# Patient Record
Sex: Male | Born: 1990 | Race: Black or African American | Hispanic: No | Marital: Single | Smoking: Former smoker
Health system: Southern US, Community
[De-identification: ages and names within clinical notes are randomized; demographics above are authoritative.]

## PROBLEM LIST (undated history)

## (undated) DIAGNOSIS — K635 Polyp of colon: Secondary | ICD-10-CM

## (undated) DIAGNOSIS — F32A Depression, unspecified: Secondary | ICD-10-CM

## (undated) DIAGNOSIS — F419 Anxiety disorder, unspecified: Secondary | ICD-10-CM

## (undated) DIAGNOSIS — K5190 Ulcerative colitis, unspecified, without complications: Secondary | ICD-10-CM

## (undated) DIAGNOSIS — K589 Irritable bowel syndrome without diarrhea: Secondary | ICD-10-CM

## (undated) DIAGNOSIS — K5090 Crohn's disease, unspecified, without complications: Secondary | ICD-10-CM

## (undated) DIAGNOSIS — B20 Human immunodeficiency virus [HIV] disease: Secondary | ICD-10-CM

## (undated) DIAGNOSIS — F329 Major depressive disorder, single episode, unspecified: Secondary | ICD-10-CM

## (undated) DIAGNOSIS — Z21 Asymptomatic human immunodeficiency virus [HIV] infection status: Secondary | ICD-10-CM

## (undated) DIAGNOSIS — J45909 Unspecified asthma, uncomplicated: Secondary | ICD-10-CM

## (undated) DIAGNOSIS — K922 Gastrointestinal hemorrhage, unspecified: Secondary | ICD-10-CM

## (undated) DIAGNOSIS — I10 Essential (primary) hypertension: Secondary | ICD-10-CM

## (undated) DIAGNOSIS — K529 Noninfective gastroenteritis and colitis, unspecified: Secondary | ICD-10-CM

## (undated) HISTORY — DX: Crohn's disease, unspecified, without complications: K50.90

## (undated) HISTORY — PX: BRAIN SURGERY: SHX531

## (undated) HISTORY — DX: Polyp of colon: K63.5

## (undated) HISTORY — DX: Major depressive disorder, single episode, unspecified: F32.9

## (undated) HISTORY — DX: Gastrointestinal hemorrhage, unspecified: K92.2

## (undated) HISTORY — DX: Ulcerative colitis, unspecified, without complications: K51.90

## (undated) HISTORY — DX: Unspecified asthma, uncomplicated: J45.909

## (undated) HISTORY — DX: Irritable bowel syndrome, unspecified: K58.9

## (undated) HISTORY — PX: COLONOSCOPY: SHX174

## (undated) HISTORY — DX: Depression, unspecified: F32.A

## (undated) HISTORY — DX: Anxiety disorder, unspecified: F41.9

---

## 2016-05-19 ENCOUNTER — Emergency Department (HOSPITAL_COMMUNITY): Payer: Self-pay

## 2016-05-19 ENCOUNTER — Emergency Department (HOSPITAL_COMMUNITY)
Admission: EM | Admit: 2016-05-19 | Discharge: 2016-05-20 | Disposition: A | Discharge status: Home / Self Care | Payer: Self-pay | Attending: Emergency Medicine | Admitting: Emergency Medicine

## 2016-05-19 ENCOUNTER — Encounter (HOSPITAL_COMMUNITY): Payer: Self-pay | Admitting: Emergency Medicine

## 2016-05-19 DIAGNOSIS — Z87891 Personal history of nicotine dependence: Secondary | ICD-10-CM | POA: Insufficient documentation

## 2016-05-19 DIAGNOSIS — K51811 Other ulcerative colitis with rectal bleeding: Secondary | ICD-10-CM | POA: Insufficient documentation

## 2016-05-19 DIAGNOSIS — R1031 Right lower quadrant pain: Secondary | ICD-10-CM

## 2016-05-19 DIAGNOSIS — Z79899 Other long term (current) drug therapy: Secondary | ICD-10-CM | POA: Insufficient documentation

## 2016-05-19 DIAGNOSIS — K5282 Eosinophilic colitis: Secondary | ICD-10-CM | POA: Insufficient documentation

## 2016-05-19 DIAGNOSIS — K529 Noninfective gastroenteritis and colitis, unspecified: Secondary | ICD-10-CM

## 2016-05-19 DIAGNOSIS — D500 Iron deficiency anemia secondary to blood loss (chronic): Secondary | ICD-10-CM | POA: Insufficient documentation

## 2016-05-19 HISTORY — DX: Noninfective gastroenteritis and colitis, unspecified: K52.9

## 2016-05-19 LAB — URINALYSIS, ROUTINE W REFLEX MICROSCOPIC
BILIRUBIN URINE: NEGATIVE
GLUCOSE, UA: NEGATIVE mg/dL
Hgb urine dipstick: NEGATIVE
KETONES UR: NEGATIVE mg/dL
Leukocytes, UA: NEGATIVE
Nitrite: NEGATIVE
PH: 5 (ref 5.0–8.0)
Protein, ur: NEGATIVE mg/dL
SPECIFIC GRAVITY, URINE: 1.024 (ref 1.005–1.030)

## 2016-05-19 LAB — CBC WITH DIFFERENTIAL/PLATELET
BASOS ABS: 0.1 10*3/uL (ref 0.0–0.1)
BASOS PCT: 1 %
EOS ABS: 1.2 10*3/uL — AB (ref 0.0–0.7)
Eosinophils Relative: 14 %
HCT: 28.2 % — ABNORMAL LOW (ref 39.0–52.0)
HEMOGLOBIN: 8.3 g/dL — AB (ref 13.0–17.0)
LYMPHS PCT: 35 %
Lymphs Abs: 2.9 10*3/uL (ref 0.7–4.0)
MCH: 19.6 pg — AB (ref 26.0–34.0)
MCHC: 29.4 g/dL — ABNORMAL LOW (ref 30.0–36.0)
MCV: 66.7 fL — ABNORMAL LOW (ref 78.0–100.0)
Monocytes Absolute: 1.1 10*3/uL — ABNORMAL HIGH (ref 0.1–1.0)
Monocytes Relative: 13 %
NEUTROS PCT: 37 %
Neutro Abs: 3.1 10*3/uL (ref 1.7–7.7)
Platelets: 641 10*3/uL — ABNORMAL HIGH (ref 150–400)
RBC: 4.23 MIL/uL (ref 4.22–5.81)
RDW: 16.8 % — ABNORMAL HIGH (ref 11.5–15.5)
WBC: 8.4 10*3/uL (ref 4.0–10.5)

## 2016-05-19 LAB — COMPREHENSIVE METABOLIC PANEL
ALBUMIN: 3.2 g/dL — AB (ref 3.5–5.0)
ALK PHOS: 102 U/L (ref 38–126)
ALT: 15 U/L — AB (ref 17–63)
AST: 25 U/L (ref 15–41)
Anion gap: 8 (ref 5–15)
BUN: 5 mg/dL — ABNORMAL LOW (ref 6–20)
CALCIUM: 8.7 mg/dL — AB (ref 8.9–10.3)
CHLORIDE: 103 mmol/L (ref 101–111)
CO2: 26 mmol/L (ref 22–32)
CREATININE: 0.97 mg/dL (ref 0.61–1.24)
GFR calc Af Amer: >60 mL/min (ref >60)
GFR calc non Af Amer: >60 mL/min (ref >60)
GLUCOSE: 96 mg/dL (ref 65–99)
Potassium: 4 mmol/L (ref 3.5–5.1)
SODIUM: 137 mmol/L (ref 135–145)
Total Bilirubin: 0.4 mg/dL (ref 0.3–1.2)
Total Protein: 5.9 g/dL — ABNORMAL LOW (ref 6.5–8.1)

## 2016-05-19 LAB — POC OCCULT BLOOD, ED: Fecal Occult Bld: POSITIVE — AB

## 2016-05-19 MED ORDER — IOPAMIDOL (ISOVUE-300) INJECTION 61%
INTRAVENOUS | Status: AC
  Filled 2016-05-19: qty 30

## 2016-05-19 MED ORDER — SODIUM CHLORIDE 0.9 % IV SOLN: 1000.0000 mL | INTRAVENOUS | Status: DC

## 2016-05-19 MED ORDER — MORPHINE SULFATE (PF) 4 MG/ML IV SOLN
4.0000 mg | Freq: Once | INTRAVENOUS | Status: AC
  Administered 2016-05-19: 4 mg via INTRAVENOUS
  Filled 2016-05-19: qty 1

## 2016-05-19 MED ORDER — SODIUM CHLORIDE 0.9 % IV SOLN
1000.0000 mL | Freq: Once | INTRAVENOUS | Status: AC
  Administered 2016-05-19: 1000 mL via INTRAVENOUS

## 2016-05-19 MED ORDER — ONDANSETRON HCL 4 MG/2ML IJ SOLN
4.0000 mg | Freq: Once | INTRAMUSCULAR | Status: AC
  Administered 2016-05-19: 4 mg via INTRAVENOUS
  Filled 2016-05-19: qty 2

## 2016-05-19 MED ORDER — IOPAMIDOL (ISOVUE-300) INJECTION 61%
INTRAVENOUS | Status: AC
  Administered 2016-05-20: 100 mL
  Filled 2016-05-19: qty 100

## 2016-05-19 NOTE — ED Notes (Signed)
Pt is aware he needs a urine sample but does not feel like he can give one yet

## 2016-05-19 NOTE — ED Triage Notes (Signed)
Pt c/o lower abd pain with blood in stools x's 1 week.  Pt st's she has hx of colitis and this feels the same.  Pt also c/o nausea without vomiting

## 2016-05-20 ENCOUNTER — Emergency Department (HOSPITAL_COMMUNITY): Payer: Self-pay

## 2016-05-20 ENCOUNTER — Encounter (HOSPITAL_COMMUNITY): Payer: Self-pay

## 2016-05-20 LAB — RAPID URINE DRUG SCREEN, HOSP PERFORMED
AMPHETAMINES: POSITIVE — AB
BENZODIAZEPINES: NOT DETECTED
Barbiturates: NOT DETECTED
Cocaine: NOT DETECTED
OPIATES: NOT DETECTED
Tetrahydrocannabinol: NOT DETECTED

## 2016-05-20 MED ORDER — OXYCODONE-ACETAMINOPHEN 5-325 MG PO TABS: 1.0000 | ORAL_TABLET | Freq: Four times a day (QID) | ORAL | 0 refills | Status: DC | PRN

## 2016-05-20 MED ORDER — MORPHINE SULFATE (PF) 4 MG/ML IV SOLN
4.0000 mg | Freq: Once | INTRAVENOUS | Status: AC
  Administered 2016-05-20: 4 mg via INTRAVENOUS
  Filled 2016-05-20: qty 1

## 2016-05-20 MED ORDER — ONDANSETRON HCL 4 MG/2ML IJ SOLN
4.0000 mg | Freq: Once | INTRAMUSCULAR | Status: AC
  Administered 2016-05-20: 4 mg via INTRAVENOUS
  Filled 2016-05-20: qty 2

## 2016-05-20 MED ORDER — METHYLPREDNISOLONE SODIUM SUCC 125 MG IJ SOLR
125.0000 mg | Freq: Once | INTRAMUSCULAR | Status: AC
  Administered 2016-05-20: 125 mg via INTRAVENOUS
  Filled 2016-05-20: qty 2

## 2016-05-20 MED ORDER — PREDNISONE 10 MG (21) PO TBPK: ORAL_TABLET | Freq: Every day | ORAL | 0 refills | Status: DC

## 2016-05-20 MED ORDER — FERROUS SULFATE 325 (65 FE) MG PO TABS: 325.0000 mg | ORAL_TABLET | Freq: Every day | ORAL | 0 refills | Status: DC

## 2016-05-20 MED ORDER — DIPHENHYDRAMINE HCL 50 MG/ML IJ SOLN: 25.0000 mg | Freq: Every evening | INTRAMUSCULAR | Status: DC | PRN

## 2016-05-20 MED ORDER — PROMETHAZINE HCL 25 MG PO TABS: 25.0000 mg | ORAL_TABLET | Freq: Four times a day (QID) | ORAL | 0 refills | Status: DC | PRN

## 2016-05-20 MED ORDER — MORPHINE SULFATE (PF) 4 MG/ML IV SOLN
4.0000 mg | Freq: Once | INTRAVENOUS | Status: AC
Start: 2016-05-20 — End: 2016-05-20
  Administered 2016-05-20: 4 mg via INTRAVENOUS
  Filled 2016-05-20: qty 1

## 2016-05-20 NOTE — Discharge Instructions (Signed)
To find a primary care or specialty doctor please call [phone] or [phone] to access "[facility] Doctor Service."  You may also go on the [facility] website at [email or web address]  There are also multiple [facility], [facility], [facility] and [facility] practices throughout the [facility] that are frequently accepting new patients. You may find a clinic that is close to your home and contact them.  [place] [id number] [place]  [place] [address] [address] [phone]

## 2016-05-20 NOTE — ED Provider Notes (Signed)
[facility] DEPT Provider Note   CSN: [id number] Arrival date & time: 05/19/16  1928     History   Chief Complaint Chief Complaint  Patient presents with  . Abdominal Pain    HPI Larry Lutz is a 26 y.o. male.  HPI Patient reports he has a history of ulcerative colitis. He states he has never had surgery before. He reports that he has been told he is anemic but has never had a blood transfusion. He reports for the past week he has been having a lot of abdominal pain particularly in the right lower quadrant. He reports he's had severe nausea but no vomiting. He reports he's been having blood with stool. No fevers. He states he has moved to [facility] more recently and has not gotten established with a gastroenterologist. Past Medical History:  Diagnosis Date  . Colitis     There are no active problems to display for this patient.   Past Surgical History:  Procedure Laterality Date  . BRAIN SURGERY         Home Medications    Prior to Admission medications   Not on File    Family History No family history on file.  Social History Social History  Substance Use Topics  . Smoking status: Former Research scientist (life sciences)  . Smokeless tobacco: Never Used  . Alcohol use No     Allergies   Patient has no allergy information on record.   Review of Systems Review of Systems 10 Systems reviewed and are negative for acute change except as noted in the HPI.   Physical Exam Updated Vital Signs BP 140/89   Pulse 86   Temp 98.7 F (37.1 C) (Oral)   Resp 16   Ht 5' 6"  (1.676 m)   Wt 167 lb 1 oz (75.8 kg)   SpO2 100%   BMI 26.96 kg/m   Physical Exam  Constitutional: He is oriented to person, place, and time. He appears well-developed and well-nourished.  HENT:  Head: Normocephalic and atraumatic.  Mouth/Throat: Oropharynx is clear and moist.  Eyes: Conjunctivae and EOM are normal.  Neck: Neck supple.  Cardiovascular: Normal rate and regular rhythm.   No murmur  heard. Pulmonary/Chest: Effort normal and breath sounds normal. No respiratory distress.  Abdominal: Soft. There is tenderness.  Moderate to severe right lower quadrant and right mid quadrant tenderness to palpation. No guarding.  Genitourinary:  Genitourinary Comments: Rectal exam: No evident hemorrhoids or active bleeding. Digital exam no stool in the vault. Trace pink tinged mucus. No clot. No melena.  Musculoskeletal: Normal range of motion. He exhibits no edema or tenderness.  Neurological: He is alert and oriented to person, place, and time. He exhibits normal muscle tone. Coordination normal.  Skin: Skin is warm and dry.  Psychiatric: He has a normal mood and affect.  Nursing note and vitals reviewed.    ED Treatments / Results  Labs (all labs ordered are listed, but only abnormal results are displayed) Labs Reviewed  CBC WITH DIFFERENTIAL/PLATELET - Abnormal; Notable for the following:       Result Value   Hemoglobin 8.3 (*)    HCT 28.2 (*)    MCV 66.7 (*)    MCH 19.6 (*)    MCHC 29.4 (*)    RDW 16.8 (*)    Platelets 641 (*)    Monocytes Absolute 1.1 (*)    Eosinophils Absolute 1.2 (*)    All other components within normal limits  COMPREHENSIVE METABOLIC PANEL - Abnormal; Notable  for the following:    BUN 5 (*)    Calcium 8.7 (*)    Total Protein 5.9 (*)    Albumin 3.2 (*)    ALT 15 (*)    All other components within normal limits  RAPID URINE DRUG SCREEN, HOSP PERFORMED - Abnormal; Notable for the following:    Amphetamines POSITIVE (*)    All other components within normal limits  POC OCCULT BLOOD, ED - Abnormal; Notable for the following:    Fecal Occult Bld POSITIVE (*)    All other components within normal limits  URINALYSIS, ROUTINE W REFLEX MICROSCOPIC    EKG  EKG Interpretation None       Radiology No results found.  Procedures Procedures (including critical care time)  Medications Ordered in ED Medications  0.9 %  sodium chloride infusion  (1,000 mLs Intravenous New Bag/Given 05/19/16 2323)    Followed by  0.9 %  sodium chloride infusion (not administered)  iopamidol (ISOVUE-300) 61 % injection (not administered)  iopamidol (ISOVUE-300) 61 % injection (not administered)  morphine 4 MG/ML injection 4 mg (4 mg Intravenous Given 05/19/16 2324)  ondansetron (ZOFRAN) injection 4 mg (4 mg Intravenous Given 05/19/16 2324)     Initial Impression / Assessment and Plan / ED Course  I have reviewed the triage vital signs and the nursing notes.  Pertinent labs & imaging results that were available during my care of the patient were reviewed by me and considered in my medical decision making (see chart for details).      Final Clinical Impressions(s) / ED Diagnoses   Final diagnoses:  Other ulcerative colitis with rectal bleeding ([place])  Right lower quadrant abdominal pain   Dr. Leonides Schanz will review the patient's CT scan. As time, patient has stable vital signs. I do suspect his anemia is chronic. Is no sign of symptomatic anemia. There is no melena in the rectal vault. If CT does not show urgent or emergent finding, I do feel patient will be safe to follow-up with gastroenterology on an outpatient basis. New Prescriptions New Prescriptions   No medications on file     Charlesetta Shanks, MD 05/20/16 0009

## 2016-05-20 NOTE — ED Notes (Signed)
Pt verbalized understanding of d/c instructions. To [facility] via [place]

## 2016-05-20 NOTE — ED Notes (Signed)
rcvd call from CT tech, pt advised CT he is having some itching after drinking contrast. Per Dr. Leonides Schanz we can give Benadryl if needed when patient returns

## 2016-05-20 NOTE — ED Notes (Signed)
Pt reminded it was time to drink 2nd bottle of oral contrast. Pt requesting pain medication

## 2016-05-20 NOTE — ED Notes (Signed)
No emesis after PO trial

## 2016-05-20 NOTE — ED Provider Notes (Signed)
3:40 AM  Assumed care from Dr. Colvin Caroli.  Pt is a 26 y.o. male with history of ulcerative colitis currently not on medications who presents to the emergency department with abdominal pain, rectal bleeding. Reports rectal bleeding has been going on for several months. No melena, not passing clots. Hemodynamically stable here. He is anemic which she states is baseline for him but he is not exactly sure what his hemoglobin is. There was a tinge colored mucus on rectal exam but no hemorrhage. Abdominal exam reveals no peritoneal signs. CT scan was pending for disposition.  CT scan shows inflammatory thickening of the descending colon through the rectum consistent with proctocolitis. There is no abscess or bowel obstruction. He states that he has taken steroids at home before with some relief. Will give dose of Solu-Medrol here. He reports feeling much better and would like discharge home. I have offered him admission for pain control and continue monitoring but he states he feels like he would like to try outpatient management and if things worsen he will come back. Plan is to discharge with prescriptions for Percocet, Phenergan, prednisone taper with outpatient PCP and GI follow-up as he is new to this area. Discussed at length return precautions. He verbalizes understanding and is comfortable with this plan.   At this time, I do not feel there is any life-threatening condition present. I have reviewed and discussed all results (EKG, imaging, lab, urine as appropriate) and exam findings with patient/family. I have reviewed nursing notes and appropriate previous records.  I feel the patient is safe to be discharged home without further emergent workup and can continue workup as an outpatient as needed. Discussed usual and customary return precautions. Patient/family verbalize understanding and are comfortable with this plan.  Outpatient follow-up has been provided if needed. All questions have been answered.     [place], DO 05/20/16 [phone]

## 2016-05-20 NOTE — ED Notes (Signed)
Pt to CT via stretcher

## 2016-05-31 ENCOUNTER — Encounter (HOSPITAL_COMMUNITY): Payer: Self-pay | Admitting: Emergency Medicine

## 2016-05-31 ENCOUNTER — Inpatient Hospital Stay (HOSPITAL_COMMUNITY)
Admission: EM | Admit: 2016-05-31 | Discharge: 2016-06-03 | DRG: 386 | Disposition: A | Discharge status: Home / Self Care | Payer: Self-pay | Attending: Internal Medicine | Admitting: Internal Medicine

## 2016-05-31 DIAGNOSIS — D62 Acute posthemorrhagic anemia: Secondary | ICD-10-CM | POA: Diagnosis present

## 2016-05-31 DIAGNOSIS — K5190 Ulcerative colitis, unspecified, without complications: Secondary | ICD-10-CM | POA: Diagnosis present

## 2016-05-31 DIAGNOSIS — R75 Inconclusive laboratory evidence of human immunodeficiency virus [HIV]: Secondary | ICD-10-CM | POA: Diagnosis present

## 2016-05-31 DIAGNOSIS — Z87891 Personal history of nicotine dependence: Secondary | ICD-10-CM

## 2016-05-31 DIAGNOSIS — R109 Unspecified abdominal pain: Secondary | ICD-10-CM

## 2016-05-31 DIAGNOSIS — K51218 Ulcerative (chronic) proctitis with other complication: Secondary | ICD-10-CM

## 2016-05-31 DIAGNOSIS — Z21 Asymptomatic human immunodeficiency virus [HIV] infection status: Secondary | ICD-10-CM | POA: Diagnosis present

## 2016-05-31 DIAGNOSIS — K51511 Left sided colitis with rectal bleeding: Principal | ICD-10-CM | POA: Diagnosis present

## 2016-05-31 DIAGNOSIS — K922 Gastrointestinal hemorrhage, unspecified: Secondary | ICD-10-CM | POA: Diagnosis present

## 2016-05-31 DIAGNOSIS — D649 Anemia, unspecified: Secondary | ICD-10-CM | POA: Diagnosis present

## 2016-05-31 LAB — CBC
HCT: 26.4 % — ABNORMAL LOW (ref 39.0–52.0)
HEMOGLOBIN: 7.5 g/dL — AB (ref 13.0–17.0)
MCH: 18 pg — ABNORMAL LOW (ref 26.0–34.0)
MCHC: 28.4 g/dL — ABNORMAL LOW (ref 30.0–36.0)
MCV: 63.3 fL — ABNORMAL LOW (ref 78.0–100.0)
PLATELETS: 637 10*3/uL — AB (ref 150–400)
RBC: 4.17 MIL/uL — AB (ref 4.22–5.81)
RDW: 17.1 % — ABNORMAL HIGH (ref 11.5–15.5)
WBC: 10.8 10*3/uL — AB (ref 4.0–10.5)

## 2016-05-31 LAB — COMPREHENSIVE METABOLIC PANEL
ALK PHOS: 95 U/L (ref 38–126)
ALT: 13 U/L — AB (ref 17–63)
ANION GAP: 7 (ref 5–15)
AST: 19 U/L (ref 15–41)
Albumin: 3.2 g/dL — ABNORMAL LOW (ref 3.5–5.0)
BUN: 11 mg/dL (ref 6–20)
CALCIUM: 8.9 mg/dL (ref 8.9–10.3)
CO2: 27 mmol/L (ref 22–32)
Chloride: 104 mmol/L (ref 101–111)
Creatinine, Ser: 1.03 mg/dL (ref 0.61–1.24)
Glucose, Bld: 83 mg/dL (ref 65–99)
Potassium: 3.3 mmol/L — ABNORMAL LOW (ref 3.5–5.1)
Sodium: 138 mmol/L (ref 135–145)
Total Bilirubin: 0.4 mg/dL (ref 0.3–1.2)
Total Protein: 6.8 g/dL (ref 6.5–8.1)

## 2016-05-31 LAB — I-STAT CG4 LACTIC ACID, ED: LACTIC ACID, VENOUS: 1.6 mmol/L (ref 0.5–1.9)

## 2016-05-31 LAB — LIPASE, BLOOD: LIPASE: 11 U/L (ref 11–51)

## 2016-05-31 NOTE — ED Triage Notes (Signed)
Pt sts lower abd pain and bloody stools x 2 weeks with N/V; pt sts hx of UC

## 2016-06-01 ENCOUNTER — Observation Stay (HOSPITAL_COMMUNITY): Payer: Self-pay

## 2016-06-01 ENCOUNTER — Encounter (HOSPITAL_COMMUNITY): Payer: Self-pay | Admitting: Internal Medicine

## 2016-06-01 DIAGNOSIS — K5190 Ulcerative colitis, unspecified, without complications: Secondary | ICD-10-CM | POA: Diagnosis present

## 2016-06-01 DIAGNOSIS — K922 Gastrointestinal hemorrhage, unspecified: Secondary | ICD-10-CM | POA: Diagnosis present

## 2016-06-01 DIAGNOSIS — K51911 Ulcerative colitis, unspecified with rectal bleeding: Secondary | ICD-10-CM

## 2016-06-01 DIAGNOSIS — D62 Acute posthemorrhagic anemia: Secondary | ICD-10-CM

## 2016-06-01 DIAGNOSIS — D649 Anemia, unspecified: Secondary | ICD-10-CM | POA: Diagnosis present

## 2016-06-01 LAB — URINALYSIS, ROUTINE W REFLEX MICROSCOPIC
Bilirubin Urine: NEGATIVE
Glucose, UA: NEGATIVE mg/dL
Hgb urine dipstick: NEGATIVE
Ketones, ur: NEGATIVE mg/dL
LEUKOCYTES UA: NEGATIVE
NITRITE: NEGATIVE
PH: 7 (ref 5.0–8.0)
Protein, ur: NEGATIVE mg/dL
SPECIFIC GRAVITY, URINE: 1.017 (ref 1.005–1.030)

## 2016-06-01 LAB — HEMOGLOBIN AND HEMATOCRIT, BLOOD
HCT: 28.6 % — ABNORMAL LOW (ref 39.0–52.0)
Hemoglobin: 8.3 g/dL — ABNORMAL LOW (ref 13.0–17.0)

## 2016-06-01 LAB — BASIC METABOLIC PANEL
ANION GAP: 8 (ref 5–15)
BUN: 11 mg/dL (ref 6–20)
CHLORIDE: 103 mmol/L (ref 101–111)
CO2: 28 mmol/L (ref 22–32)
CREATININE: 1.11 mg/dL (ref 0.61–1.24)
Calcium: 8 mg/dL — ABNORMAL LOW (ref 8.9–10.3)
GFR calc non Af Amer: >60 mL/min (ref >60)
Glucose, Bld: 110 mg/dL — ABNORMAL HIGH (ref 65–99)
POTASSIUM: 3.4 mmol/L — AB (ref 3.5–5.1)
SODIUM: 139 mmol/L (ref 135–145)

## 2016-06-01 LAB — CBC
HCT: 22.4 % — ABNORMAL LOW (ref 39.0–52.0)
HEMOGLOBIN: 6.4 g/dL — AB (ref 13.0–17.0)
MCH: 18.3 pg — ABNORMAL LOW (ref 26.0–34.0)
MCHC: 28.6 g/dL — AB (ref 30.0–36.0)
MCV: 64.2 fL — ABNORMAL LOW (ref 78.0–100.0)
Platelets: 530 10*3/uL — ABNORMAL HIGH (ref 150–400)
RBC: 3.49 MIL/uL — AB (ref 4.22–5.81)
RDW: 17.5 % — ABNORMAL HIGH (ref 11.5–15.5)
WBC: 9.1 10*3/uL (ref 4.0–10.5)

## 2016-06-01 LAB — PREPARE RBC (CROSSMATCH)

## 2016-06-01 LAB — ABO/RH: ABO/RH(D): A POS

## 2016-06-01 LAB — POC OCCULT BLOOD, ED: FECAL OCCULT BLD: POSITIVE — AB

## 2016-06-01 LAB — OCCULT BLOOD X 1 CARD TO LAB, STOOL: Fecal Occult Bld: POSITIVE — AB

## 2016-06-01 MED ORDER — ONDANSETRON HCL 4 MG/2ML IJ SOLN
4.0000 mg | Freq: Once | INTRAMUSCULAR | Status: AC
  Administered 2016-06-01: 4 mg via INTRAVENOUS
  Filled 2016-06-01: qty 2

## 2016-06-01 MED ORDER — ACETAMINOPHEN 325 MG PO TABS: 650.0000 mg | ORAL_TABLET | Freq: Four times a day (QID) | ORAL | Status: DC | PRN

## 2016-06-01 MED ORDER — MORPHINE SULFATE (PF) 4 MG/ML IV SOLN
2.0000 mg | Freq: Once | INTRAVENOUS | Status: AC
  Administered 2016-06-01: 2 mg via INTRAVENOUS
  Filled 2016-06-01: qty 1

## 2016-06-01 MED ORDER — SODIUM CHLORIDE 0.9 % IV SOLN
INTRAVENOUS | Status: AC
  Administered 2016-06-01 (×2): via INTRAVENOUS

## 2016-06-01 MED ORDER — ONDANSETRON HCL 4 MG PO TABS: 4.0000 mg | ORAL_TABLET | Freq: Four times a day (QID) | ORAL | Status: DC | PRN

## 2016-06-01 MED ORDER — MORPHINE SULFATE (PF) 2 MG/ML IV SOLN
2.0000 mg | INTRAVENOUS | Status: DC | PRN
  Administered 2016-06-01 – 2016-06-03 (×7): 2 mg via INTRAVENOUS
  Filled 2016-06-01 (×7): qty 1

## 2016-06-01 MED ORDER — ACETAMINOPHEN 650 MG RE SUPP: 650.0000 mg | Freq: Four times a day (QID) | RECTAL | Status: DC | PRN

## 2016-06-01 MED ORDER — SODIUM CHLORIDE 0.9 % IV BOLUS (SEPSIS)
1000.0000 mL | Freq: Once | INTRAVENOUS | Status: AC
  Administered 2016-06-01: 1000 mL via INTRAVENOUS

## 2016-06-01 MED ORDER — POTASSIUM CHLORIDE CRYS ER 20 MEQ PO TBCR
40.0000 meq | EXTENDED_RELEASE_TABLET | Freq: Once | ORAL | Status: AC
  Administered 2016-06-01: 40 meq via ORAL
  Filled 2016-06-01: qty 2

## 2016-06-01 MED ORDER — TUBERCULIN PPD 5 UNIT/0.1ML ID SOLN
5.0000 [IU] | Freq: Once | INTRADERMAL | Status: AC
  Administered 2016-06-01: 5 [IU] via INTRADERMAL
  Filled 2016-06-01: qty 0.1

## 2016-06-01 MED ORDER — HYOSCYAMINE SULFATE 0.5 MG/ML IJ SOLN
0.1250 mg | Freq: Once | INTRAMUSCULAR | Status: AC
  Administered 2016-06-01: 0.125 mg via INTRAVENOUS
  Filled 2016-06-01: qty 0.25

## 2016-06-01 MED ORDER — METHYLPREDNISOLONE SODIUM SUCC 40 MG IJ SOLR
40.0000 mg | Freq: Two times a day (BID) | INTRAMUSCULAR | Status: DC
  Administered 2016-06-01 – 2016-06-03 (×4): 40 mg via INTRAVENOUS
  Filled 2016-06-01 (×4): qty 1

## 2016-06-01 MED ORDER — METHYLPREDNISOLONE SODIUM SUCC 40 MG IJ SOLR
40.0000 mg | Freq: Every day | INTRAMUSCULAR | Status: DC
  Filled 2016-06-01: qty 1

## 2016-06-01 MED ORDER — SODIUM CHLORIDE 0.9 % IV SOLN
Freq: Once | INTRAVENOUS | Status: AC
  Administered 2016-06-01: 07:00:00 via INTRAVENOUS

## 2016-06-01 MED ORDER — ONDANSETRON HCL 4 MG/2ML IJ SOLN
4.0000 mg | Freq: Four times a day (QID) | INTRAMUSCULAR | Status: DC | PRN
  Administered 2016-06-01: 4 mg via INTRAVENOUS
  Filled 2016-06-01: qty 2

## 2016-06-01 NOTE — Consult Note (Signed)
[facility] Gastroenterology Consultation Note  Referring Provider: Dr. Dessa Phi [facility]) Primary Care Physician:  No PCP Per Patient  Reason for Consultation:  Bloody diarrhea, abnormal CT scan  HPI: Larry Lutz is a 26 y.o. male with 8-year history of ulcerative colitis, presenting with refractory bloody diarrhea.  Having several bloody stools per day with urgency and generalized abdominal pain and subjective fevers.  No improvement with outpatient course of prednisone.  Recent CT scan showed left-sided colitis.  Been intermittently on prednisone and multiple admissions in [place], much of the past several years.  No maintenance therapy attempted per patient other than mesalamine agents, which haven't helped.  No sick contacts, extracontinental travel, recent antibiotics.   Past Medical History:  Diagnosis Date  . Colitis     Past Surgical History:  Procedure Laterality Date  . BRAIN SURGERY      Prior to Admission medications   Medication Sig Start Date End Date Taking? Authorizing Provider  ferrous sulfate 325 (65 FE) MG tablet Take 1 tablet (325 mg total) by mouth daily. 05/20/16  Yes Kristen N Ward, DO  loperamide (IMODIUM) 2 MG capsule Take 2 mg by mouth daily as needed for diarrhea or loose stools.   Yes Historical Provider, MD  oxyCODONE-acetaminophen (PERCOCET/ROXICET) 5-325 MG tablet Take 1-2 tablets by mouth every 6 (six) hours as needed. Patient taking differently: Take 1-2 tablets by mouth every 6 (six) hours as needed for moderate pain.  05/20/16  Yes Kristen N Ward, DO  predniSONE (STERAPRED UNI-PAK 21 TAB) 10 MG (21) TBPK tablet Take by mouth daily. Take 6 tabs by mouth daily  for 2 days, then 5 tabs for 2 days, then 4 tabs for 2 days, then 3 tabs for 2 days, 2 tabs for 2 days, then 1 tab by mouth daily for 2 days 05/20/16  Yes Kristen N Ward, DO  promethazine (PHENERGAN) 25 MG tablet Take 1 tablet (25 mg total) by mouth every 6 (six) hours as needed for nausea or vomiting. 05/20/16   Yes Kristen N Ward, DO    Current Facility-Administered Medications  Medication Dose Route Frequency Provider Last Rate Last Dose  . 0.9 %  sodium chloride infusion   Intravenous Continuous Rise Patience, MD 100 mL/hr at 06/01/16 0441    . acetaminophen (TYLENOL) tablet 650 mg  650 mg Oral Q6H PRN Rise Patience, MD       Or  . acetaminophen (TYLENOL) suppository 650 mg  650 mg Rectal Q6H PRN Rise Patience, MD      . methylPREDNISolone sodium succinate (SOLU-MEDROL) 40 mg/mL injection 40 mg  40 mg Intravenous Daily Rise Patience, MD      . morphine 2 MG/ML injection 2 mg  2 mg Intravenous Q4H PRN Rise Patience, MD   2 mg at 06/01/16 1050  . ondansetron (ZOFRAN) tablet 4 mg  4 mg Oral Q6H PRN Rise Patience, MD       Or  . ondansetron [facility]) injection 4 mg  4 mg Intravenous Q6H PRN Rise Patience, MD   4 mg at 06/01/16 0448    Allergies as of 05/31/2016  . (No Known Allergies)    Family History  Problem Relation Age of Onset  . Ulcerative colitis Mother     Social History   Social History  . Marital status: Single    Spouse name: N/A  . Number of children: N/A  . Years of education: N/A   Occupational History  . Not  on file.   Social History Main Topics  . Smoking status: Former Research scientist (life sciences)  . Smokeless tobacco: Never Used  . Alcohol use No  . Drug use: No  . Sexual activity: Not on file   Other Topics Concern  . Not on file   Social History Narrative  . No narrative on file    Review of Systems: Positive = bold Gen: Denies any fever, chills, rigors, night sweats, anorexia, fatigue, weakness, malaise, involuntary weight loss, and sleep disorder CV: Denies chest pain, angina, palpitations, syncope, orthopnea, PND, peripheral edema, and claudication. Resp: Denies dyspnea, cough, sputum, wheezing, coughing up blood. GI: Described in detail in HPI.    GU : Denies urinary burning, blood in urine, urinary frequency, urinary  hesitancy, nocturnal urination, and urinary incontinence. MS: Denies joint pain or swelling.  Denies muscle weakness, cramps, atrophy.  Derm: Denies rash, itching, oral ulcerations, hives, unhealing ulcers.  Psych: Denies depression, anxiety, memory loss, suicidal ideation, hallucinations,  and confusion. Heme: Denies bruising, bleeding, and enlarged lymph nodes. Neuro:  Denies any headaches, dizziness, paresthesias. Endo:  Denies any problems with DM, thyroid, adrenal function.  Physical Exam: Vital signs in last 24 hours: Temp:  [98.5 F (36.9 C)-99.2 F (37.3 C)] 98.5 F (36.9 C) (03/20 1036) Pulse Rate:  [71-118] 71 (03/20 1036) Resp:  [16-18] 18 (03/20 1036) BP: (103-138)/(54-99) 126/76 (03/20 1036) SpO2:  [98 %-100 %] 99 % (03/20 1036) Weight:  [72.1 kg (158 lb 14.4 oz)] 72.1 kg (158 lb 14.4 oz) (03/20 0400) Last BM Date: 05/31/16 General:   Alert,  Well-developed, well-nourished, pleasant and cooperative in NAD Head:  Normocephalic and atraumatic. Eyes:  Sclera clear, no icterus.   Conjunctiva pink. Ears:  Normal auditory acuity. Nose:  No deformity, discharge,  or lesions. Mouth:  No deformity or lesions.  Oropharynx dry Neck:  Supple; no masses or thyromegaly. Lungs:  Clear throughout to auscultation.   No wheezes, crackles, or rhonchi. No acute distress. Heart:  Regular rate and rhythm; no murmurs, clicks, rubs,  or gallops. Abdomen:  Soft, mild distention, hypoactive but present bowel sounds, mild generalized tenderness No masses, hepatosplenomegaly or hernias noted. Normal bowel sounds, without guarding, and without rebound.     Msk:  Symmetrical without gross deformities. Normal posture. Pulses:  Normal pulses noted. Extremities:  Without clubbing or edema. Neurologic:  Alert and  oriented x4; diffusely weak, otherwise grossly normal neurologically. Skin:  Multiple tattoos, Intact without significant lesions or rashes. Psych:  Alert and cooperative. Normal mood and  affect.   Lab Results:  Recent Labs  05/31/16 1858 06/01/16 0434 06/01/16 1122  WBC 10.8* 9.1  --   HGB 7.5* 6.4* 8.3*  HCT 26.4* 22.4* 28.6*  PLT 637* 530*  --    BMET  Recent Labs  05/31/16 1858 06/01/16 0434  NA 138 139  K 3.3* 3.4*  CL 104 103  CO2 27 28  GLUCOSE 83 110*  BUN 11 11  CREATININE 1.03 1.11  CALCIUM 8.9 8.0*   LFT  Recent Labs  05/31/16 1858  PROT 6.8  ALBUMIN 3.2*  AST 19  ALT 13*  ALKPHOS 95  BILITOT 0.4   PT/INR No results for input(s): LABPROT, INR in the last 72 hours.  Studies/Results: No results found.  Impression:  1.  Abdominal pain. 2.  Subjective fevers. 3.  Blood loss anemia. 4.  Abnormal CT scan, left-sided colitis. 5.  Overall constellation of findings most consistent with ulcerative colitis flare.  Concomitant infection can't be  ruled out.  Plan:  1.  Clear liquid diet only. 2.  Stool studies (C. Diff, GI pathogen panel). 3.  Increase Solumedrol to 40 mg IV Q 12 hours. 4.  If no improvement over the next couple days, consider sigmoidoscopy. 5.  Patient likely is going to need anti-TNF therapy long-term; will initiate screening studies in anticipation of this need in the near future. 6.  [facility] will follow.   LOS: 1 day   Shayana Hornstein M  06/01/2016, 1:10 PM  Pager [phone] If no answer or after 5 PM call [phone]

## 2016-06-01 NOTE — ED Notes (Signed)
Pt requesting food and drink, Nehemiah Settle PA asked and pt given [place] sandwich and ginger ale

## 2016-06-01 NOTE — H&P (Signed)
History and Physical    Birt Reinoso SFK:[id number] DOB: 1990/08/17 DOA: 05/31/2016  PCP: No PCP Per Patient  Patient coming from: Home.  Chief Complaint: Rectal bleeding and abdominal pain.  HPI: Larry Lutz is a 26 y.o. male with history of ulcerative colitis who has recently moved to [place] 2 months ago and has not taken his medications presented to the ER 10 days ago with increasing rectal bleeding abdominal pain with nausea vomiting. CT scan of the abdomen at that time showed inflammation involving the descending colon through rectum. Patient was discharged home on prednisone and referred to outpatient gastroenterologist. Patient came back to the ER today due to persistent symptoms. Pain is mostly in the lower abdomen. Pain increases on bowel movement.   ED Course: On exam patient has mild tenderness in the lower quadrants mostly the left side. Hemoglobin is around 7 at drop of almost 1 g from previous. Patient is being admitted for acute exacerbation of ulcerative colitis.  Review of Systems: As per HPI, rest all negative.   Past Medical History:  Diagnosis Date  . Colitis     Past Surgical History:  Procedure Laterality Date  . BRAIN SURGERY       reports that he has quit smoking. He has never used smokeless tobacco. He reports that he does not drink alcohol or use drugs.  No Known Allergies  Family History  Problem Relation Age of Onset  . Ulcerative colitis Mother     Prior to Admission medications   Medication Sig Start Date End Date Taking? Authorizing Provider  ferrous sulfate 325 (65 FE) MG tablet Take 1 tablet (325 mg total) by mouth daily. 05/20/16  Yes Kristen N Ward, DO  loperamide (IMODIUM) 2 MG capsule Take 2 mg by mouth daily as needed for diarrhea or loose stools.   Yes Historical Provider, MD  oxyCODONE-acetaminophen (PERCOCET/ROXICET) 5-325 MG tablet Take 1-2 tablets by mouth every 6 (six) hours as needed. Patient taking differently: Take 1-2 tablets by  mouth every 6 (six) hours as needed for moderate pain.  05/20/16  Yes Kristen N Ward, DO  predniSONE (STERAPRED UNI-PAK 21 TAB) 10 MG (21) TBPK tablet Take by mouth daily. Take 6 tabs by mouth daily  for 2 days, then 5 tabs for 2 days, then 4 tabs for 2 days, then 3 tabs for 2 days, 2 tabs for 2 days, then 1 tab by mouth daily for 2 days 05/20/16  Yes Kristen N Ward, DO  promethazine (PHENERGAN) 25 MG tablet Take 1 tablet (25 mg total) by mouth every 6 (six) hours as needed for nausea or vomiting. 05/20/16  Yes Delice Bison Ward, DO    Physical Exam: Vitals:   06/01/16 0200 06/01/16 0215 06/01/16 0333 06/01/16 0400  BP: (!) 127/55 133/83 134/70 135/64  Pulse: (!) 118 93 92 73  Resp:   18 16  Temp:    98.6 F (37 C)  TempSrc:    Oral  SpO2: 100% 100% 98% 98%  Weight:    72.1 kg (158 lb 14.4 oz)  Height:    5' 7"  (1.702 m)      Constitutional: Moderately built and nourished. Vitals:   06/01/16 0200 06/01/16 0215 06/01/16 0333 06/01/16 0400  BP: (!) 127/55 133/83 134/70 135/64  Pulse: (!) 118 93 92 73  Resp:   18 16  Temp:    98.6 F (37 C)  TempSrc:    Oral  SpO2: 100% 100% 98% 98%  Weight:  72.1 kg (158 lb 14.4 oz)  Height:    5' 7"  (1.702 m)   Eyes: Anicteric no pallor. ENMT: No discharge from the ears eyes nose and mouth. Neck: No mass felt. No neck rigidity. Respiratory: No rhonchi or crepitations. Cardiovascular: S1-S2 heard no murmurs appreciated. Abdomen: Mild tenderness in the left lower quadrant. Musculoskeletal: No edema. No joint effusion. Skin: No rash. Skin appears warm. Neurologic: Alert awake oriented to time place and person. Moves all extremities. Psychiatric: Appears normal. Normal affect.   Labs on Admission: I have personally reviewed following labs and imaging studies  CBC:  Recent Labs Lab 05/31/16 1858  WBC 10.8*  HGB 7.5*  HCT 26.4*  MCV 63.3*  PLT 448*   Basic Metabolic Panel:  Recent Labs Lab 05/31/16 1858  NA 138  K 3.3*  CL 104    CO2 27  GLUCOSE 83  BUN 11  CREATININE 1.03  CALCIUM 8.9   GFR: Estimated Creatinine Clearance: 100.7 mL/min (by C-G formula based on SCr of 1.03 mg/dL). Liver Function Tests:  Recent Labs Lab 05/31/16 1858  AST 19  ALT 13*  ALKPHOS 95  BILITOT 0.4  PROT 6.8  ALBUMIN 3.2*    Recent Labs Lab 05/31/16 1858  LIPASE 11   No results for input(s): AMMONIA in the last 168 hours. Coagulation Profile: No results for input(s): INR, PROTIME in the last 168 hours. Cardiac Enzymes: No results for input(s): CKTOTAL, CKMB, CKMBINDEX, TROPONINI in the last 168 hours. BNP (last 3 results) No results for input(s): PROBNP in the last 8760 hours. HbA1C: No results for input(s): HGBA1C in the last 72 hours. CBG: No results for input(s): GLUCAP in the last 168 hours. Lipid Profile: No results for input(s): CHOL, HDL, LDLCALC, TRIG, CHOLHDL, LDLDIRECT in the last 72 hours. Thyroid Function Tests: No results for input(s): TSH, T4TOTAL, FREET4, T3FREE, THYROIDAB in the last 72 hours. Anemia Panel: No results for input(s): VITAMINB12, FOLATE, FERRITIN, TIBC, IRON, RETICCTPCT in the last 72 hours. Urine analysis:    Component Value Date/Time   COLORURINE YELLOW 06/01/2016 0326   APPEARANCEUR CLEAR 06/01/2016 0326   LABSPEC 1.017 06/01/2016 0326   PHURINE 7.0 06/01/2016 0326   GLUCOSEU NEGATIVE 06/01/2016 0326   HGBUR NEGATIVE 06/01/2016 0326   BILIRUBINUR NEGATIVE 06/01/2016 0326   KETONESUR NEGATIVE 06/01/2016 0326   PROTEINUR NEGATIVE 06/01/2016 0326   NITRITE NEGATIVE 06/01/2016 0326   LEUKOCYTESUR NEGATIVE 06/01/2016 0326   Sepsis Labs: @LABRCNTIP (procalcitonin:4,lacticidven:4) )No results found for this or any previous visit (from the past 240 hour(s)).   Radiological Exams on Admission: No results found.   Assessment/Plan Principal Problem:   Ulcerative colitis, acute (HCC) Active Problems:   Gastrointestinal hemorrhage   Acute blood loss anemia    1. Acute  exacerbation of ulcerative colitis - I have placed patient on Solu-Medrol 40 mg IV daily with IV fluids clear liquid diet. Consult gastroenterologist in a.m. Patient denies having taken any recent antibiotics or sick contacts. 2. Acute blood loss anemia - follow CBC. Patient agrees for transfusion if there is drop in hemoglobin less than 7.   DVT prophylaxis: SCDs. Code Status: Full code.  Family Communication: Discussed with patient.  Disposition Plan: Home.  Consults called: None.  Admission status: Observation.    Rise Patience MD [facility] Pager [phone].  If 7PM-7AM, please contact night-coverage [email or web address] Password [email or web address]  06/01/2016, 4:18 AM

## 2016-06-01 NOTE — ED Notes (Signed)
Admitting MD at bedside.

## 2016-06-01 NOTE — Progress Notes (Signed)
Progress note  Patient admitted earlier this morning. See H&P. He was diagnosed with ulcerative colitis when he was 26 years old. His most recent colonoscopy was July 2017. At that time, it showed inflammatory bowel disease with some polyps. He recently moved to [place] about 2 months ago and has not established with GI yet. He presented to the emergency department about 10 days ago with increased rectal bleeding, abdominal pain. CT scan showed inflammation involving the ascending colon to rectum. He was discharged home on prednisone. He returns with persistent symptoms.  Patient started on Solu-Medrol Full liquid diet 1 unit blood transfusion for acute blood loss anemia, trend H&H  Replace potassium  GI consultation  Dessa Phi, DO [facility] [email or web address] Password [email or web address] 06/01/2016, 10:15 AM

## 2016-06-01 NOTE — ED Provider Notes (Signed)
[facility] DEPT Provider Note   CSN: [id number] Arrival date & time: 05/31/16  1847     History   Chief Complaint Chief Complaint  Patient presents with  . Rectal Bleeding  . Emesis    HPI Larry Lutz is a 26 y.o. male.  Patient with a history of ulcerative colitis presents with diffuse abdominal pain, nausea, vomiting and diarrhea. He reports bloody stools for "months". Seen here 05/19/16 and diagnosed by CT scan with UC exaceration and referred to GI but has been unable to be seen yet. He reports subjective fever. New symptoms in the last 2-3 days are lightheadedness and extreme fatigue. No syncope, falls, injury. He states he is unable to eat or drink without vomiting or having an episode of diarrhea. No urinary symptoms, chest pain, SOB.   The history is provided by the patient. No language interpreter was used.  Rectal Bleeding  Associated symptoms: abdominal pain, fever, light-headedness and vomiting   Emesis   Associated symptoms include abdominal pain, chills, diarrhea and a fever. Pertinent negatives include no myalgias.    Past Medical History:  Diagnosis Date  . Colitis     There are no active problems to display for this patient.   Past Surgical History:  Procedure Laterality Date  . BRAIN SURGERY         Home Medications    Prior to Admission medications   Medication Sig Start Date End Date Taking? Authorizing Provider  ferrous sulfate 325 (65 FE) MG tablet Take 1 tablet (325 mg total) by mouth daily. 05/20/16   Kristen N Ward, DO  oxyCODONE-acetaminophen (PERCOCET/ROXICET) 5-325 MG tablet Take 1-2 tablets by mouth every 6 (six) hours as needed. 05/20/16   Kristen N Ward, DO  predniSONE (STERAPRED UNI-PAK 21 TAB) 10 MG (21) TBPK tablet Take by mouth daily. Take 6 tabs by mouth daily  for 2 days, then 5 tabs for 2 days, then 4 tabs for 2 days, then 3 tabs for 2 days, 2 tabs for 2 days, then 1 tab by mouth daily for 2 days 05/20/16   Delice Bison Ward, DO    promethazine (PHENERGAN) 25 MG tablet Take 1 tablet (25 mg total) by mouth every 6 (six) hours as needed for nausea or vomiting. 05/20/16   Mosses, DO    Family History History reviewed. No pertinent family history.  Social History Social History  Substance Use Topics  . Smoking status: Former Research scientist (life sciences)  . Smokeless tobacco: Never Used  . Alcohol use No     Allergies   Patient has no known allergies.   Review of Systems Review of Systems  Constitutional: Positive for chills, fatigue and fever.  Respiratory: Negative.  Negative for shortness of breath.   Cardiovascular: Negative.  Negative for chest pain.  Gastrointestinal: Positive for abdominal pain, blood in stool, diarrhea, hematochezia, nausea and vomiting.  Genitourinary: Negative.   Musculoskeletal: Negative.  Negative for myalgias.  Skin: Negative.   Neurological: Positive for weakness and light-headedness. Negative for syncope.     Physical Exam Updated Vital Signs BP 137/82   Pulse 88   Temp 99 F (37.2 C) (Oral)   Resp 18   SpO2 100%   Physical Exam  Constitutional: He is oriented to person, place, and time. He appears well-developed and well-nourished.  HENT:  Head: Normocephalic.  Neck: Normal range of motion. Neck supple.  Cardiovascular: Normal rate and regular rhythm.   No murmur heard. Pulmonary/Chest: Effort normal and breath sounds normal. He  has no wheezes. He has no rales.  Abdominal: Soft. Bowel sounds are normal. There is tenderness (Diffuse tenderness, greatest in the lower abdomen.). There is no rebound and no guarding.  Musculoskeletal: Normal range of motion.  Neurological: He is alert and oriented to person, place, and time.  Skin: Skin is warm and dry. No rash noted.  Psychiatric: He has a normal mood and affect.     ED Treatments / Results  Labs (all labs ordered are listed, but only abnormal results are displayed) Labs Reviewed  COMPREHENSIVE METABOLIC PANEL - Abnormal;  Notable for the following:       Result Value   Potassium 3.3 (*)    Albumin 3.2 (*)    ALT 13 (*)    All other components within normal limits  CBC - Abnormal; Notable for the following:    WBC 10.8 (*)    RBC 4.17 (*)    Hemoglobin 7.5 (*)    HCT 26.4 (*)    MCV 63.3 (*)    MCH 18.0 (*)    MCHC 28.4 (*)    RDW 17.1 (*)    Platelets 637 (*)    All other components within normal limits  LIPASE, BLOOD  URINALYSIS, ROUTINE W REFLEX MICROSCOPIC  I-STAT CG4 LACTIC ACID, ED  POC OCCULT BLOOD, ED  TYPE AND SCREEN    EKG  EKG Interpretation None       Radiology No results found.  Procedures Procedures (including critical care time)  Medications Ordered in ED Medications  sodium chloride 0.9 % bolus 1,000 mL (1,000 mLs Intravenous New Bag/Given 06/01/16 0042)  ondansetron (ZOFRAN) injection 4 mg (4 mg Intravenous Given 06/01/16 0040)  hyoscyamine (LEVSIN) 0.5 MG/ML injection 0.125 mg (0.125 mg Intravenous Given 06/01/16 0057)     Initial Impression / Assessment and Plan / ED Course  I have reviewed the triage vital signs and the nursing notes.  Pertinent labs & imaging results that were available during my care of the patient were reviewed by me and considered in my medical decision making (see chart for details).     Patient with a h/o UC here with complaint of abdominal pain, N, V, bloody diarrhea. Symptoms have been ongoing for months with new symptoms of fatigue and lightheadedness x 2 days.   Labs show hgb of 7.5, decreased from 8.3 on 05/19/16. CT scan at that time showed finding c/w UC. He has not had GI follow up. Normal vitals today. Do not feel repeat scan would have greater benefit than risk. Medications ordered, fluids. Will reassess.   On re-evaluation, the patient continues to have pain. He is guaiac positive with a hgb that is decreasing. VSS. Feel he will require admission and consideration of transfusion. Discussed admission with Dr. Renetta Chalk who accepts  the patient onto his service.   Final Clinical Impressions(s) / ED Diagnoses   Final diagnoses:  None   1. Ulcerative colitis 2. GI bleeding 3. Anemia  New Prescriptions New Prescriptions   No medications on file     Charlann Lange, Hershal Coria 01/74/94 4967    David Glick, MD 59/16/38 4665

## 2016-06-02 DIAGNOSIS — Z8781 Personal history of (healed) traumatic fracture: Secondary | ICD-10-CM

## 2016-06-02 DIAGNOSIS — Z21 Asymptomatic human immunodeficiency virus [HIV] infection status: Secondary | ICD-10-CM | POA: Diagnosis present

## 2016-06-02 DIAGNOSIS — Z7252 High risk homosexual behavior: Secondary | ICD-10-CM

## 2016-06-02 DIAGNOSIS — K5190 Ulcerative colitis, unspecified, without complications: Secondary | ICD-10-CM

## 2016-06-02 DIAGNOSIS — Z8379 Family history of other diseases of the digestive system: Secondary | ICD-10-CM

## 2016-06-02 LAB — CBC
HCT: 28.1 % — ABNORMAL LOW (ref 39.0–52.0)
HEMATOCRIT: 29.3 % — AB (ref 39.0–52.0)
HEMOGLOBIN: 8.5 g/dL — AB (ref 13.0–17.0)
HEMOGLOBIN: 8.3 g/dL — AB (ref 13.0–17.0)
MCH: 18.7 pg — ABNORMAL LOW (ref 26.0–34.0)
MCH: 19.1 pg — AB (ref 26.0–34.0)
MCHC: 29 g/dL — AB (ref 30.0–36.0)
MCHC: 29.5 g/dL — ABNORMAL LOW (ref 30.0–36.0)
MCV: 64.5 fL — ABNORMAL LOW (ref 78.0–100.0)
MCV: 64.6 fL — AB (ref 78.0–100.0)
PLATELETS: 626 10*3/uL — AB (ref 150–400)
Platelets: 613 10*3/uL — ABNORMAL HIGH (ref 150–400)
RBC: 4.54 MIL/uL (ref 4.22–5.81)
RBC: 4.35 MIL/uL (ref 4.22–5.81)
RDW: 16.9 % — ABNORMAL HIGH (ref 11.5–15.5)
RDW: 17.1 % — ABNORMAL HIGH (ref 11.5–15.5)
WBC: 7.3 10*3/uL (ref 4.0–10.5)
WBC: 12.4 10*3/uL — AB (ref 4.0–10.5)

## 2016-06-02 LAB — GASTROINTESTINAL PANEL BY PCR, STOOL (REPLACES STOOL CULTURE)
ADENOVIRUS F40/41: NOT DETECTED
ASTROVIRUS: NOT DETECTED
CAMPYLOBACTER SPECIES: NOT DETECTED
CYCLOSPORA CAYETANENSIS: NOT DETECTED
Cryptosporidium: NOT DETECTED
ENTEROPATHOGENIC E COLI (EPEC): NOT DETECTED
ENTEROTOXIGENIC E COLI (ETEC): NOT DETECTED
Entamoeba histolytica: NOT DETECTED
Enteroaggregative E coli (EAEC): NOT DETECTED
Giardia lamblia: NOT DETECTED
Norovirus GI/GII: NOT DETECTED
PLESIMONAS SHIGELLOIDES: NOT DETECTED
ROTAVIRUS A: NOT DETECTED
SAPOVIRUS (I, II, IV, AND V): NOT DETECTED
SHIGA LIKE TOXIN PRODUCING E COLI (STEC): NOT DETECTED
Salmonella species: NOT DETECTED
Shigella/Enteroinvasive E coli (EIEC): NOT DETECTED
Vibrio cholerae: NOT DETECTED
Vibrio species: NOT DETECTED
Yersinia enterocolitica: NOT DETECTED

## 2016-06-02 LAB — BASIC METABOLIC PANEL
ANION GAP: 8 (ref 5–15)
BUN: 5 mg/dL — ABNORMAL LOW (ref 6–20)
CALCIUM: 9 mg/dL (ref 8.9–10.3)
CO2: 28 mmol/L (ref 22–32)
Chloride: 102 mmol/L (ref 101–111)
Creatinine, Ser: 0.93 mg/dL (ref 0.61–1.24)
GLUCOSE: 116 mg/dL — AB (ref 65–99)
Sodium: 138 mmol/L (ref 135–145)

## 2016-06-02 LAB — TYPE AND SCREEN
ABO/RH(D): A POS
ANTIBODY SCREEN: NEGATIVE
Unit division: 0

## 2016-06-02 LAB — BPAM RBC
BLOOD PRODUCT EXPIRATION DATE: 201803272359
ISSUE DATE / TIME: 201803200647
UNIT TYPE AND RH: 600

## 2016-06-02 LAB — HIV 1/2 AB DIFFERENTIATION
HIV 1 Ab: POSITIVE — AB
HIV 2 Ab: NEGATIVE

## 2016-06-02 LAB — HIV ANTIBODY (ROUTINE TESTING W REFLEX): HIV Screen 4th Generation wRfx: REACTIVE — AB

## 2016-06-02 MED ORDER — DIPHENHYDRAMINE HCL 25 MG PO CAPS
25.0000 mg | ORAL_CAPSULE | Freq: Three times a day (TID) | ORAL | Status: DC | PRN
  Administered 2016-06-02: 25 mg via ORAL
  Filled 2016-06-02: qty 1

## 2016-06-02 MED ORDER — ALUM & MAG HYDROXIDE-SIMETH 200-200-20 MG/5ML PO SUSP: 15.0000 mL | ORAL | Status: DC | PRN

## 2016-06-02 MED ORDER — ALUM & MAG HYDROXIDE-SIMETH 200-200-20 MG/5ML PO SUSP
30.0000 mL | Freq: Once | ORAL | Status: AC
  Administered 2016-06-02: 30 mL via ORAL
  Filled 2016-06-02: qty 30

## 2016-06-02 MED ORDER — FAMOTIDINE 20 MG PO TABS
20.0000 mg | ORAL_TABLET | Freq: Two times a day (BID) | ORAL | Status: DC
  Administered 2016-06-02 (×2): 20 mg via ORAL
  Filled 2016-06-02 (×3): qty 1

## 2016-06-02 NOTE — Progress Notes (Signed)
[facility] PROGRESS NOTE  Larry Lutz RUE:[id number] DOB: 1990/12/21 DOA: 05/31/2016  PCP: No PCP Per Patient  Brief History/Interval Summary: 26 y.o. male with history of ulcerative colitis who has recently moved to [place] 2 months ago and has not taken his medications presented to the ER 10 days ago with increasing rectal bleeding abdominal pain with nausea vomiting. CT scan of the abdomen at that time showed inflammation involving the descending colon through rectum. Patient was discharged home on prednisone and referred to outpatient gastroenterologist. Patient came back to the ER today due to persistent symptoms. He was hospitalized for further management.  Reason for Visit: Ulcerative colitis  Consultants: Gastroenterology  Procedures: None  Antibiotics: None  Subjective/Interval History: Patient states that the frequency of his bowel movements, is decreasing. He is noticing less blood with each stool. Denies any nausea, vomiting. Still has some abdominal discomfort.  ROS: Denies any chest pain or shortness of breath.  Objective:  Vital Signs  Vitals:   06/01/16 1036 06/01/16 1944 06/02/16 0300 06/02/16 0324  BP: 126/76 112/73 131/79   Pulse: 71 78 77   Resp: 18 18 18    Temp: 98.5 F (36.9 C) 98.3 F (36.8 C) 97.9 F (36.6 C)   TempSrc: Oral Oral Oral   SpO2: 99% 100% 100%   Weight:    71.7 kg (158 lb)  Height:        Intake/Output Summary (Last 24 hours) at 06/02/16 1356 Last data filed at 06/02/16 0519  Gross per 24 hour  Intake             1020 ml  Output             1800 ml  Net             -780 ml   Filed Weights   06/01/16 0400 06/02/16 0324  Weight: 72.1 kg (158 lb 14.4 oz) 71.7 kg (158 lb)    General appearance: alert, cooperative, appears stated age and no distress Resp: clear to auscultation bilaterally Cardio: regular rate and rhythm, S1, S2 normal, no murmur, click, rub or gallop GI: Abdomen is soft. Distended. Mildly tender in  the left lower quadrant without any rebound, rigidity or guarding. No masses, organomegaly. Extremities: extremities normal, atraumatic, no cyanosis or edema Neurologic: No focal deficits  Lab Results:  Data Reviewed: I have personally reviewed following labs and imaging studies  CBC:  Recent Labs Lab 05/31/16 1858 06/01/16 0434 06/01/16 1122 06/02/16 0228  WBC 10.8* 9.1  --  7.3  HGB 7.5* 6.4* 8.3* 8.5*  HCT 26.4* 22.4* 28.6* 29.3*  MCV 63.3* 64.2*  --  64.5*  PLT 637* 530*  --  613*    Basic Metabolic Panel:  Recent Labs Lab 05/31/16 1858 06/01/16 0434 06/02/16 0228  NA 138 139 138  K 3.3* 3.4* DELTA CHECK NOTED  CL 104 103 102  CO2 27 28 28   GLUCOSE 83 110* 116*  BUN 11 11 5*  CREATININE 1.03 1.11 0.93  CALCIUM 8.9 8.0* 9.0    GFR: Estimated Creatinine Clearance: 111.5 mL/min (by C-G formula based on SCr of 0.93 mg/dL).  Liver Function Tests:  Recent Labs Lab 05/31/16 1858  AST 19  ALT 13*  ALKPHOS 95  BILITOT 0.4  PROT 6.8  ALBUMIN 3.2*     Recent Labs Lab 05/31/16 1858  LIPASE 11    Radiology Studies: Dg Chest 2 View  Result Date: 06/01/2016 CLINICAL DATA:  Epigastric pain, fever EXAM: CHEST  2 VIEW COMPARISON:  None. FINDINGS: The heart size and mediastinal contours are within normal limits. Both lungs are clear. The visualized skeletal structures are unremarkable. IMPRESSION: No active cardiopulmonary disease. Electronically Signed   By: Franchot Gallo M.D.   On: 06/01/2016 14:10   Dg Abd 2 Views  Result Date: 06/01/2016 CLINICAL DATA:  Crohn's disease, acute epigastric pain, acute colitis EXAM: ABDOMEN - 2 VIEW COMPARISON:  05/20/2016 FINDINGS: Negative for obstruction, significant dilatation, ileus, or free air. On the supine view, there is slight wall thickening of the left descending colon which can be seen with colitis in the setting of inflammatory bowel disease. This is in a similar location to the CT of 05/20/2016, when the patient  had active colitis. IMPRESSION: Negative for obstruction or free air. Query wall thickening in the left descending colon which can be seen with colitis. Electronically Signed   By: Jerilynn Mages.  Shick M.D.   On: 06/01/2016 14:12     Medications:  Scheduled: . famotidine  20 mg Oral BID  . methylPREDNISolone (SOLU-MEDROL) injection  40 mg Intravenous Q12H  . tuberculin  5 Units Intradermal Once   Continuous:  KKX:[id number] **OR** acetaminophen, alum & mag hydroxide-simeth, morphine injection, ondansetron **OR** ondansetron (ZOFRAN) IV  Assessment/Plan:  Principal Problem:   Ulcerative colitis, acute ([place]) Active Problems:   Gastrointestinal hemorrhage   Acute blood loss anemia    Acute exacerbation of ulcerative colitis. Patient was seen by gastroenterology. Patient was started on steroids. His symptoms are improving. GI pathogen panel was also ordered, although this is unlikely to be infectious. Continue to monitor for now.  Acute blood loss anemia. Patient was transfused. Hemoglobin has responded. Monitor closely.  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 Patient is new to this area. We'll need to discuss with him in detail. Will inform ID as well. Check HIV quantitative.   DVT Prophylaxis: SCD's    Code Status: Full Code  Family Communication: Discussed with patient  Disposition Plan: Await improvement.    LOS: 2 days   [facility] Pager [phone] 06/02/2016, 1:56 PM  If 7PM-7AM, please contact night-coverage at [email or web address], password [facility]
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
## 2016-06-02 NOTE — Progress Notes (Signed)
Subjective: Much improved.  Less bloody diarrhea, less abdominal pain.  Objective: Vital signs in last 24 hours: Temp:  [97.9 F (36.6 C)-98.5 F (36.9 C)] 97.9 F (36.6 C) (03/21 0300) Pulse Rate:  [71-78] 77 (03/21 0300) Resp:  [18] 18 (03/21 0300) BP: (112-131)/(73-79) 131/79 (03/21 0300) SpO2:  [99 %-100 %] 100 % (03/21 0300) Weight:  [71.7 kg (158 lb)] 71.7 kg (158 lb) (03/21 0324) Weight change: -0.408 kg (-14.4 oz) Last BM Date: 06/01/16  PE: GEN:  NAD ABD:  Soft, mild generalized tenderness, no distention, no peritonitis.  Lab Results: CBC    Component Value Date/Time   WBC 7.3 06/02/2016 0228   RBC 4.54 06/02/2016 0228   HGB 8.5 (L) 06/02/2016 0228   HCT 29.3 (L) 06/02/2016 0228   PLT 613 (H) 06/02/2016 0228   MCV 64.5 (L) 06/02/2016 0228   MCH 18.7 (L) 06/02/2016 0228   MCHC 29.0 (L) 06/02/2016 0228   RDW 16.9 (H) 06/02/2016 0228   LYMPHSABS 2.9 05/19/2016 1959   MONOABS 1.1 (H) 05/19/2016 1959   EOSABS 1.2 (H) 05/19/2016 1959   BASOSABS 0.1 05/19/2016 1959   CMP     Component Value Date/Time   NA 138 06/02/2016 0228   K DELTA CHECK NOTED 06/02/2016 0228   CL 102 06/02/2016 0228   CO2 28 06/02/2016 0228   GLUCOSE 116 (H) 06/02/2016 0228   BUN 5 (L) 06/02/2016 0228   CREATININE 0.93 06/02/2016 0228   CALCIUM 9.0 06/02/2016 0228   PROT 6.8 05/31/2016 1858   ALBUMIN 3.2 (L) 05/31/2016 1858   AST 19 05/31/2016 1858   ALT 13 (L) 05/31/2016 1858   ALKPHOS 95 05/31/2016 1858   BILITOT 0.4 05/31/2016 1858   GFRNONAA >60 06/02/2016 0228   GFRAA >60 06/02/2016 0228   ABD xray:  No bowel distention or free air; possible left-sided colitis  Assessment:  1.  Abdominal pain with bloody diarrhea.  Improving. 2.  Subjective fevers. 3.  Blood loss anemia. 4.  Abnormal CT scan, left-sided colitis. 5.  Overall constellation of findings most consistent with ulcerative colitis flare.  Concomitant infection can't be ruled out.  Plan:  1.  Advance diet to  soft mechanical. 2.  Continue methylprednisolone 40 mg IV Q12 hours for another day, possible transition to prednisone tomorrow. 3.  Awaiting stool studies. 4.  Awaiting PPD results. 5.  Likely will need infliximab for maintenance of remission, if he continues to improve can likely defer this as outpatient.  Would likely also benefit from flex sig prior to initiation of flex sig. 6.  [facility] will follow.   Landry Dyke 06/02/2016, 8:20 AM   Pager [phone] If no answer or after 5 PM call [phone]

## 2016-06-03 DIAGNOSIS — R634 Abnormal weight loss: Secondary | ICD-10-CM

## 2016-06-03 DIAGNOSIS — Z87891 Personal history of nicotine dependence: Secondary | ICD-10-CM

## 2016-06-03 DIAGNOSIS — Z6825 Body mass index (BMI) 25.0-25.9, adult: Secondary | ICD-10-CM

## 2016-06-03 LAB — BASIC METABOLIC PANEL
Anion gap: 10 (ref 5–15)
BUN: 12 mg/dL (ref 6–20)
CO2: 26 mmol/L (ref 22–32)
CREATININE: 1.13 mg/dL (ref 0.61–1.24)
Calcium: 8.8 mg/dL — ABNORMAL LOW (ref 8.9–10.3)
Chloride: 99 mmol/L — ABNORMAL LOW (ref 101–111)
GFR calc Af Amer: >60 mL/min (ref >60)
Glucose, Bld: 179 mg/dL — ABNORMAL HIGH (ref 65–99)
Potassium: 4.4 mmol/L (ref 3.5–5.1)
SODIUM: 135 mmol/L (ref 135–145)

## 2016-06-03 LAB — HEPATITIS B CORE ANTIBODY, TOTAL
HEP B C TOTAL AB: NEGATIVE
Hep B Core Total Ab: NEGATIVE

## 2016-06-03 LAB — CBC
HCT: 27.5 % — ABNORMAL LOW (ref 39.0–52.0)
Hemoglobin: 8 g/dL — ABNORMAL LOW (ref 13.0–17.0)
MCH: 19 pg — AB (ref 26.0–34.0)
MCHC: 29.1 g/dL — AB (ref 30.0–36.0)
MCV: 65.3 fL — ABNORMAL LOW (ref 78.0–100.0)
PLATELETS: 643 10*3/uL — AB (ref 150–400)
RBC: 4.21 MIL/uL — ABNORMAL LOW (ref 4.22–5.81)
RDW: 17.6 % — ABNORMAL HIGH (ref 11.5–15.5)
WBC: 10.9 10*3/uL — ABNORMAL HIGH (ref 4.0–10.5)

## 2016-06-03 LAB — HIV-1 RNA ULTRAQUANT REFLEX TO GENTYP+
HIV-1 RNA BY PCR: <20 copies/mL
HIV-1 RNA Quant, Log: UNDETERMINED log10copy/mL

## 2016-06-03 LAB — HEPATITIS A ANTIBODY, TOTAL: Hep A Total Ab: POSITIVE — AB

## 2016-06-03 LAB — HEPATITIS B SURFACE ANTIGEN: HEP B S AG: NEGATIVE

## 2016-06-03 LAB — T-HELPER CELLS (CD4) COUNT (NOT AT ARMC)
CD4 % Helper T Cell: 29 % — ABNORMAL LOW (ref 33–55)
CD4 T CELL ABS: 760 /uL (ref 400–2700)

## 2016-06-03 LAB — HEPATITIS B SURFACE ANTIBODY,QUALITATIVE: HEP B S AB: REACTIVE

## 2016-06-03 LAB — HEPATITIS B CORE ANTIBODY, IGM: Hep B C IgM: NEGATIVE

## 2016-06-03 LAB — RPR: RPR Ser Ql: NONREACTIVE

## 2016-06-03 MED ORDER — FERROUS SULFATE 325 (65 FE) MG PO TABS: 325.0000 mg | ORAL_TABLET | Freq: Every day | ORAL | 0 refills | Status: DC

## 2016-06-03 MED ORDER — FAMOTIDINE 20 MG PO TABS: 20.0000 mg | ORAL_TABLET | Freq: Two times a day (BID) | ORAL | 0 refills | Status: DC

## 2016-06-03 MED ORDER — PREDNISONE 20 MG PO TABS
40.0000 mg | ORAL_TABLET | Freq: Every day | ORAL | Status: DC
  Administered 2016-06-03: 40 mg via ORAL
  Filled 2016-06-03: qty 2

## 2016-06-03 MED ORDER — PREDNISONE 20 MG PO TABS: 40.0000 mg | ORAL_TABLET | Freq: Every day | ORAL | 0 refills | Status: DC

## 2016-06-03 MED ORDER — OXYCODONE-ACETAMINOPHEN 5-325 MG PO TABS: 1.0000 | ORAL_TABLET | Freq: Four times a day (QID) | ORAL | 0 refills | Status: DC | PRN

## 2016-06-03 NOTE — Discharge Summary (Signed)
[facility]  Physician Discharge Summary   Patient ID: Larry Lutz MRN: [id number] DOB/AGE: 10-27-1990 26 y.o.  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 Infectious disease to arrange outpatient follow-up for further management of HIV 2. Gastroenterology to arrange outpatient follow-up as well  DISCHARGE CONDITION: fair   Filed Weights   06/01/16 0400 06/02/16 0324 06/03/16 0509  Weight: 72.1 kg (158 lb 14.4 oz) 71.7 kg (158 lb) 74.6 kg (164 lb 7.4 oz)    INITIAL HISTORY: 26 y.o.malewith history of ulcerative colitis who has recently moved to [place] 2 months ago and has not taken his medications presented to the ER 10 days ago with increasing rectal bleeding abdominal pain with nausea vomiting. CT scan of the abdomen at that time showed inflammation involving the descending colon through rectum. Patient was discharged home on prednisone and referred to outpatient gastroenterologist. Patient came back to the ER today due to persistent symptoms. He was hospitalized for further management.  Consultations:  Gastroenterology  Infectious disease   HOSPITAL COURSE:   Acute exacerbation of ulcerative colitis. Impression presented with diarrhea and hematochezia. Patient was seen by gastroenterology. Patient was started on steroids. His symptoms started improving. GI pathogen panel was negative. This is unlikely to be infectious. Improved this morning. Hasn't had any bowel movement since yesterday. Abdominal pain is also improved. He is tolerating his diet. Discussed with Dr. Paulita Fujita with gastroenterology. Recommends discharging him on 40 mg of prednisone daily. He will arrange outpatient follow-up and biological  treatment for his ulcerative colitis.  Acute blood loss anemia. Patient was transfused. Hemoglobin has responded. Bleeding has subsided. Outpatient monitoring of blood work.  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 CD4 count is 760. Discussed in detail with patient. Dr. Megan Salon with infectious disease also consulted. He will arrange outpatient follow-up and treatment. HIV viral load is pending. RPR nonreactive. Risk factors discussed with the patient. Modes of transmission of HIV discussed with the patient. He is not in any sexual relationship at this time. He is bisexual. He admits to unprotected sex when he was incarcerated last year.  Patient is stable. He has improved. Okay for discharge home today.    PERTINENT LABS:  The results of significant diagnostics from this hospitalization (including imaging, microbiology, ancillary and laboratory) are listed below for reference.    Microbiology: Recent Results (from the past 240 hour(s))  Gastrointestinal Panel by PCR , Stool     Status: None   Collection Time: 06/01/16  1:32 PM  Result Value Ref Range Status   Campylobacter species NOT DETECTED NOT DETECTED Final   Plesimonas shigelloides NOT DETECTED NOT DETECTED Final   Salmonella species NOT DETECTED NOT DETECTED Final   Yersinia enterocolitica NOT DETECTED NOT DETECTED Final   Vibrio species NOT DETECTED NOT DETECTED Final   Vibrio cholerae NOT DETECTED NOT DETECTED Final   Enteroaggregative E coli (EAEC) NOT DETECTED NOT DETECTED Final   Enteropathogenic E coli (EPEC) NOT DETECTED NOT DETECTED Final   Enterotoxigenic E coli (ETEC) NOT DETECTED NOT DETECTED Final   Shiga like toxin producing E coli (STEC) NOT DETECTED NOT DETECTED Final   Shigella/Enteroinvasive E coli (EIEC) NOT DETECTED NOT DETECTED Final   Cryptosporidium NOT DETECTED NOT DETECTED Final   Cyclospora  cayetanensis NOT DETECTED NOT DETECTED Final   Entamoeba histolytica NOT DETECTED NOT DETECTED Final     Giardia lamblia NOT DETECTED NOT DETECTED Final   Adenovirus F40/41 NOT DETECTED NOT DETECTED Final   Astrovirus NOT DETECTED NOT DETECTED Final   Norovirus GI/GII NOT DETECTED NOT DETECTED Final   Rotavirus A NOT DETECTED NOT DETECTED Final   Sapovirus (I, II, IV, and V) NOT DETECTED NOT DETECTED Final     Labs: Basic Metabolic Panel:  Recent Labs Lab 05/31/16 1858 06/01/16 0434 06/02/16 0228 06/03/16 0217  NA 138 139 138 135  K 3.3* 3.4* DELTA CHECK NOTED 4.4  CL 104 103 102 99*  CO2 27 28 28 26   GLUCOSE 83 110* 116* 179*  BUN 11 11 5* 12  CREATININE 1.03 1.11 0.93 1.13  CALCIUM 8.9 8.0* 9.0 8.8*   Liver Function Tests:  Recent Labs Lab 05/31/16 1858  AST 19  ALT 13*  ALKPHOS 95  BILITOT 0.4  PROT 6.8  ALBUMIN 3.2*    Recent Labs Lab 05/31/16 1858  LIPASE 11   CBC:  Recent Labs Lab 05/31/16 1858 06/01/16 0434 06/01/16 1122 06/02/16 0228 06/02/16 1457 06/03/16 0217  WBC 10.8* 9.1  --  7.3 12.4* 10.9*  HGB 7.5* 6.4* 8.3* 8.5* 8.3* 8.0*  HCT 26.4* 22.4* 28.6* 29.3* 28.1* 27.5*  MCV 63.3* 64.2*  --  64.5* 64.6* 65.3*  PLT 637* 530*  --  613* 626* 643*    IMAGING STUDIES Dg Chest 2 View  Result Date: 06/01/2016 CLINICAL DATA:  Epigastric pain, fever EXAM: CHEST  2 VIEW COMPARISON:  None. FINDINGS: The heart size and mediastinal contours are within normal limits. Both lungs are clear. The visualized skeletal structures are unremarkable. IMPRESSION: No active cardiopulmonary disease. Electronically Signed   By: Franchot Gallo M.D.   On: 06/01/2016 14:10   Ct Abdomen Pelvis W Contrast  Result Date: 05/20/2016 CLINICAL DATA:  Lower abdominal pain with blood in stool x1 week EXAM: CT ABDOMEN AND PELVIS WITH CONTRAST TECHNIQUE: Multidetector CT imaging of the abdomen and pelvis was performed using the standard protocol following bolus administration of intravenous contrast. CONTRAST:  16m ISOVUE-300 IOPAMIDOL (ISOVUE-300) INJECTION 61% COMPARISON:  None.  FINDINGS: Lower chest: No acute abnormality. Hepatobiliary: No focal liver abnormality is seen. No gallstones, gallbladder wall thickening, or biliary dilatation. Pancreas: Unremarkable. No pancreatic ductal dilatation or surrounding inflammatory changes. Spleen: Normal in size without focal abnormality. Adrenals/Urinary Tract: Adrenal glands are unremarkable. Kidneys are normal, without renal calculi, focal lesion, or hydronephrosis. Bladder is unremarkable. Stomach/Bowel: Diffuse transmural thickening of the descending colon through rectum consistent with proctocolitis. No bowel obstruction. Moderate colonic stool burden from cecum to the splenic flexure. No small bowel obstruction normal appendix. Vascular/Lymphatic: No significant vascular findings are present. No enlarged abdominal or pelvic lymph nodes. Reproductive: Prostate is unremarkable. Other: Small fat containing umbilical hernia. No abdominopelvic ascites. Musculoskeletal: No acute or significant osseous findings. IMPRESSION: Inflammatory thickening descending colon through rectum consistent with proctocolitis. No abscess or bowel obstruction. There is a moderate degree of retained fecal residue however from cecum through splenic flexure just proximal to the area inflamed bowel. Electronically Signed   By: DAshley RoyaltyM.D.   On: 05/20/2016 03:10   Dg Abd 2 Views  Result Date: 06/01/2016 CLINICAL DATA:  Crohn's disease, acute epigastric pain, acute colitis EXAM: ABDOMEN - 2 VIEW COMPARISON:  05/20/2016 FINDINGS: Negative for obstruction, significant dilatation, ileus, or free air. On the supine view, there is slight wall thickening  of the left descending colon which can be seen with colitis in the setting of inflammatory bowel disease. This is in a similar location to the CT of 05/20/2016, when the patient had active colitis. IMPRESSION: Negative for obstruction or free air. Query wall thickening in the left descending colon which can be seen with  colitis. Electronically Signed   By: Jerilynn Mages.  Shick M.D.   On: 06/01/2016 14:12    DISCHARGE EXAMINATION: Vitals:   06/02/16 1700 06/02/16 2124 06/03/16 0509 06/03/16 1300  BP: 130/68 137/82 121/69 (!) 122/58  Pulse: 79 91 72 68  Resp: 20 18 18 20   Temp:  99 F (37.2 C) 98.6 F (37 C)   TempSrc:  Oral Oral   SpO2: 100% 98% 100% 100%  Weight:   74.6 kg (164 lb 7.4 oz)   Height:       General appearance: alert, cooperative, appears stated age and no distress Resp: clear to auscultation bilaterally Cardio: regular rate and rhythm, S1, S2 normal, no murmur, click, rub or gallop GI: Abdomen is soft. Mildly tender without any rebound, rigidity or guarding. No masses or organomegaly. Bowel sounds are present.  DISPOSITION: Home  Discharge Instructions    Call MD for:  difficulty breathing, headache or visual disturbances    Complete by:  As directed    Call MD for:  extreme fatigue    Complete by:  As directed    Call MD for:  persistant dizziness or light-headedness    Complete by:  As directed    Call MD for:  persistant nausea and vomiting    Complete by:  As directed    Call MD for:  severe uncontrolled pain    Complete by:  As directed    Call MD for:  temperature >100.4    Complete by:  As directed    Discharge instructions    Complete by:  As directed    Please taking medications as prescribed. Please be sure to follow-up with Dr. Paulita Fujita with gastroenterology as well as Dr. Megan Salon with infectious diseases. Seek attention if diarrhea and bleeding worsens.  You were cared for by a hospitalist during your hospital stay. If you have any questions about your discharge medications or the care you received while you were in the hospital after you are discharged, you can call the unit and asked to speak with the hospitalist on call if the hospitalist that took care of you is not available. Once you are discharged, your primary care physician will handle any further medical issues.  Please note that NO REFILLS for any discharge medications will be authorized once you are discharged, as it is imperative that you return to your primary care physician (or establish a relationship with a primary care physician if you do not have one) for your aftercare needs so that they can reassess your need for medications and monitor your lab values. If you do not have a primary care physician, you can call [phone] for a physician referral.   Increase activity slowly    Complete by:  As directed       ALLERGIES: No Known Allergies   Discharge Medication List as of 06/03/2016  3:15 PM    START taking these medications   Details  famotidine (PEPCID) 20 MG tablet Take 1 tablet (20 mg total) by mouth 2 (two) times daily., Starting Thu 06/03/2016, Print    predniSONE (DELTASONE) 20 MG tablet Take 2 tablets (40 mg total) by mouth daily with breakfast.,  Starting Thu 06/03/2016, Print      CONTINUE these medications which have CHANGED   Details  ferrous sulfate 325 (65 FE) MG tablet Take 1 tablet (325 mg total) by mouth daily., Starting Thu 06/03/2016, Print    oxyCODONE-acetaminophen (PERCOCET/ROXICET) 5-325 MG tablet Take 1-2 tablets by mouth every 6 (six) hours as needed for moderate pain., Starting Thu 06/03/2016, Print      CONTINUE these medications which have NOT CHANGED   Details  loperamide (IMODIUM) 2 MG capsule Take 2 mg by mouth daily as needed for diarrhea or loose stools., Historical Med    promethazine (PHENERGAN) 25 MG tablet Take 1 tablet (25 mg total) by mouth every 6 (six) hours as needed for nausea or vomiting., Starting Thu 05/20/2016, Print      STOP taking these medications     predniSONE (STERAPRED UNI-PAK 21 TAB) 10 MG (21) TBPK tablet          Follow-up Information    Landry Dyke, MD Follow up.   Specialty:  Gastroenterology Why:  his office will call for appt Contact information: [address] [place] [address] [phone]         Michel Bickers, MD Follow up.   Specialty:  Infectious Diseases Contact information: [address] [place] [address] [phone]           TOTAL DISCHARGE TIME: 35 mins  [facility] Pager [phone]  06/03/2016, 4:38 PM

## 2016-06-03 NOTE — Care Management Note (Signed)
Case Management Note Marvetta Gibbons RN, BSN Unit 2W-Case Manager [phone]  Patient Details  Name: Larry Lutz MRN: [id number] Date of Birth: November 26, 1990  Subjective/Objective:    Pt admitted with GIB                Action/Plan: PTA pt lived at home- independent- per MD note pt has f/u appointment made with ID clinic who will assist with applications for drug assistance program. No CM needs noted- pt's d/c meds are low cost.   Expected Discharge Date:  06/03/16               Expected Discharge Plan:  Home/Self Care  In-House Referral:     Discharge planning Services  CM Consult  Post Acute Care Choice:  NA Choice offered to:  NA  DME Arranged:    DME Agency:     HH Arranged:    [place] Agency:     Status of Service:  Completed, signed off  If discussed at [employer] of Stay Meetings, dates discussed:    Additional Comments:  Dawayne Patricia, RN 06/03/2016, 2:54 PM

## 2016-06-03 NOTE — Progress Notes (Signed)
Discharged patient per MD order. Patient was non-tele. Iv removed from right AC. AVS went over with patient. Prescriptions given. Patient advised to schedule follow up appointments if offices hadn't called in three days. Walked patient to cardiac rehab exit where ride was waiting. Patient to home with self.  Cyndia Bent RN

## 2016-06-03 NOTE — Progress Notes (Signed)
Patient ID: Larry Lutz, male   DOB: [birth year], 26 y.o.   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 famotidine  20 mg Oral BID  . predniSONE  40 mg Oral Q breakfast  . tuberculin  5 Units Intradermal Once    SUBJECTIVE: Larry Lutz is feeling a little bit better today but still having left lower quadrant pain. He had one loose stool today but did not notice any blood.  Review of Systems: Review of Systems  Constitutional: Positive for weight loss. Negative for chills, diaphoresis, fever and malaise/fatigue.  HENT: Negative for sore throat.   Respiratory: Negative for cough, sputum production and shortness of breath.   Cardiovascular: Negative for chest pain.  Gastrointestinal: Positive for abdominal pain. Negative for diarrhea, heartburn, nausea and vomiting.  Genitourinary: Negative for dysuria and frequency.  Musculoskeletal: Negative for joint pain and myalgias.  Skin: Negative for rash.  Neurological: Negative for dizziness and headaches.  Psychiatric/Behavioral: Negative for depression and substance abuse. The patient is not nervous/anxious.     Past Medical History:  Diagnosis Date  . Colitis     Social History  Substance Use Topics  . Smoking status: Former Research scientist (life sciences)  . Smokeless tobacco: Never Used  . Alcohol use No    Family History  Problem Relation Age of Onset  . Ulcerative colitis Mother    No Known Allergies  OBJECTIVE: Vitals:   06/02/16 0324 06/02/16 1700 06/02/16 2124 06/03/16 0509  BP:  130/68 137/82 121/69  Pulse:  79 91 72  Resp:  20 18 18   Temp:   99 F (37.2 C) 98.6 F (37 C)  TempSrc:   Oral Oral  SpO2:  100% 98% 100%  Weight: 158 lb (71.7 kg)   164 lb 7.4 oz (74.6 kg)  Height:       Body mass index is 25.76  kg/m.  Physical Exam  Constitutional: He is oriented to person, place, and time.  He is in good spirits. He states he just woke up.  HENT:  Mouth/Throat: No oropharyngeal exudate.  Eyes: Conjunctivae are normal.  Cardiovascular: Normal rate and regular rhythm.   No murmur heard. Pulmonary/Chest: Breath sounds normal.  Abdominal: Soft. He exhibits no mass. There is tenderness. There is no rebound and no guarding.  Musculoskeletal: Normal range of motion.  Neurological: He is alert and oriented to person, place, and time.  Skin: No rash noted.  Psychiatric: Mood and affect normal.    Lab Results Lab Results  Component Value Date   WBC 10.9 (H) 06/03/2016   HGB 8.0 (L) 06/03/2016   HCT 27.5 (L) 06/03/2016   MCV 65.3 (L) 06/03/2016   PLT 643 (H) 06/03/2016    Lab Results  Component Value Date   CREATININE 1.13 06/03/2016   BUN 12 06/03/2016   NA 135 06/03/2016   K 4.4 06/03/2016   CL 99 (L) 06/03/2016   CO2 26 06/03/2016    Lab Results  Component Value Date   ALT 13 (L) 05/31/2016   AST 19 05/31/2016   ALKPHOS 95 05/31/2016   BILITOT 0.4 05/31/2016    CD4 T Cell Abs (/uL)  Date Value  06/02/2016 760   Microbiology: Recent Results (from the past 240 hour(s))  Gastrointestinal Panel by PCR , Stool     Status: None   Collection Time: 06/01/16  1:32 PM  Result Value Ref Range Status   Campylobacter species NOT DETECTED NOT DETECTED Final   Plesimonas shigelloides NOT DETECTED NOT DETECTED Final   Salmonella species NOT DETECTED NOT DETECTED Final   Yersinia enterocolitica NOT DETECTED NOT DETECTED Final   Vibrio species NOT DETECTED NOT DETECTED Final   Vibrio cholerae NOT DETECTED NOT DETECTED Final   Enteroaggregative E coli (EAEC) NOT DETECTED NOT DETECTED Final   Enteropathogenic E coli (EPEC) NOT DETECTED NOT DETECTED Final   Enterotoxigenic E coli (ETEC) NOT DETECTED NOT DETECTED Final   Shiga like toxin producing E coli (STEC) NOT DETECTED NOT DETECTED  Final   Shigella/Enteroinvasive E coli (EIEC) NOT DETECTED NOT DETECTED Final   Cryptosporidium NOT DETECTED NOT DETECTED Final   Cyclospora cayetanensis NOT DETECTED NOT DETECTED Final   Entamoeba histolytica NOT DETECTED NOT DETECTED Final   Giardia lamblia NOT DETECTED NOT DETECTED Final   Adenovirus F40/41 NOT DETECTED NOT DETECTED Final   Astrovirus NOT DETECTED NOT DETECTED Final   Norovirus GI/GII NOT DETECTED NOT DETECTED Final   Rotavirus A NOT DETECTED NOT DETECTED Final   Sapovirus (I, II, IV, and V) NOT DETECTED NOT DETECTED Final     ASSESSMENT: Larry Lutz CD4 count as well up in the normal range which is obviously good. I reviewed interpretation of CD4 counts and HIV viral loads with him. His viral load is pending. There is no urgency to start antiretroviral therapy. I will get him back to in my clinic soon after he is discharged. He will need to meet with one of our financial counselors to sign up for [facility] drug assistance program.  PLAN: 1. Follow-up in my clinic after discharge  Michel Bickers, [place] for [place] [phone] pager   [phone] cell 06/03/2016, 2:10 PM

## 2016-06-03 NOTE — Discharge Instructions (Signed)
Ulcerative Colitis, Adult Ulcerative colitis is long-lasting (chronic) swelling (inflammation) of the large intestine (colon). Sores (ulcers) may also form on the colon. Ulcerative colitis is closely related to another condition of inflammation of the intestines that is called Crohn disease. Together, they are frequently referred to as inflammatory bowel disease (IBD). What are the causes? Ulcerative colitis is caused by increased activity of the immune system in the intestines. The immune system is the system that protects the body against harmful bacteria, viruses, fungi, and other things that can make you sick. When the immune system overacts, it causes inflammation. The cause of the increased immune system activity is not known. What increases the risk? Risk factors of ulcerative colitis include:  Age. This includes:  Being 52-41 years old.  Being older than 27 years old.  Having a family history of ulcerative colitis.  Being of Jewish descent. What are the signs or symptoms? Common symptoms of ulcerative colitis include rectal bleeding and diarrhea. There is a wide range of symptoms, and a person's symptoms depend on how severe the condition is. Additional symptoms may include:  Pain or cramping in the belly (abdomen).  Fever.  Fatigue.  Weight loss.  Night sweats.  Rectal pain.  Feeling the immediate need to have a bowel movement.  Nausea.  Loss of appetite.  Anemia.  Joint pain or soreness.  Eye irritation.  Certain skin rashes. How is this diagnosed? Ulcerative colitis may be diagnosed by:  Medical history and physical exam.  Blood tests and stool tests.  X-rays.  CT scans.  Colonoscopy. For this test, a flexible tube is inserted into your anus and your colon is examined.  Examination of a tissue sample from your colon (biopsy). How is this treated? Treatment for ulcerative colitis may include medicines to:  Decrease inflammation.  Control your  immune system. Surgery may also be necessary. Follow these instructions at home: Medicines and vitamins   Take medicines only as directed by your doctor. Do not take aspirin.  Ask your doctor if you should take any vitamins or supplements. Lifestyle   Exercise regularly.  Limit alcohol intake to no more than 1 drink per day for nonpregnant women and 2 drinks per day for men. One drink equals 12 ounces of beer, 5 ounces of wine, or 1 ounces of hard liquor. Eating and drinking   Drink enough fluid to keep your urine clear or pale yellow.  Ask your health care provider about the best diet for you. Follow the diet as directed by your health care provider. This may include:  Avoiding carbonated drinks.  Avoiding popcorn, vegetable skins, nuts, and other high-fiber foods when you have symptoms of ulcerative colitis.  Eating smaller meals more often.  Keeping a food diary. This may help you to find and avoid any foods that make you feel not well.  Limit your caffeine intake. General instructions   Keep all follow-up appointments as directed by your health care provider. This is important. Contact a health care provider if:  Your symptoms do not improve or get worse with treatment.  You continue to lose weight.  You have constant cramps or loose bowels.  You develop a new skin rash, skin sores, or eye problems.  You have a fever or chills. Get help right away if:  You have bloody diarrhea.  You have severe pain in your abdomen.  You vomit. This information is not intended to replace advice given to you by your health care provider. Make sure  you discuss any questions you have with your health care provider. Document Released: 12/09/2004 Document Revised: 11/02/2015 Document Reviewed: 06/24/2014 Elsevier Interactive Patient Education  2017 [employer].

## 2016-06-03 NOTE — Progress Notes (Signed)
Subjective: Bloody diarrhea nearly resolved. Abdominal pain is improving.  Objective: Vital signs in last 24 hours: Temp:  [98.6 F (37 C)-99 F (37.2 C)] 98.6 F (37 C) (03/22 0509) Pulse Rate:  [68-91] 68 (03/22 1300) Resp:  [18-20] 20 (03/22 1300) BP: (121-137)/(58-82) 122/58 (03/22 1300) SpO2:  [98 %-100 %] 100 % (03/22 1300) Weight:  [74.6 kg (164 lb 7.4 oz)] 74.6 kg (164 lb 7.4 oz) (03/22 0509) Weight change: 2.932 kg (6 lb 7.4 oz) Last BM Date: 06/02/16  PE: GEN:  NAD ABD:  Soft, mild protuberant, mild generalized tenderness without peritonitis SKIN:  PPD right forearm negative.  Lab Results: CBC    Component Value Date/Time   WBC 10.9 (H) 06/03/2016 0217   RBC 4.21 (L) 06/03/2016 0217   HGB 8.0 (L) 06/03/2016 0217   HCT 27.5 (L) 06/03/2016 0217   PLT 643 (H) 06/03/2016 0217   MCV 65.3 (L) 06/03/2016 0217   MCH 19.0 (L) 06/03/2016 0217   MCHC 29.1 (L) 06/03/2016 0217   RDW 17.6 (H) 06/03/2016 0217   LYMPHSABS 2.9 05/19/2016 1959   MONOABS 1.1 (H) 05/19/2016 1959   EOSABS 1.2 (H) 05/19/2016 1959   BASOSABS 0.1 05/19/2016 1959   CMP     Component Value Date/Time   NA 135 06/03/2016 0217   K 4.4 06/03/2016 0217   CL 99 (L) 06/03/2016 0217   CO2 26 06/03/2016 0217   GLUCOSE 179 (H) 06/03/2016 0217   BUN 12 06/03/2016 0217   CREATININE 1.13 06/03/2016 0217   CALCIUM 8.8 (L) 06/03/2016 0217   PROT 6.8 05/31/2016 1858   ALBUMIN 3.2 (L) 05/31/2016 1858   AST 19 05/31/2016 1858   ALT 13 (L) 05/31/2016 1858   ALKPHOS 95 05/31/2016 1858   BILITOT 0.4 05/31/2016 1858   GFRNONAA >60 06/03/2016 0217   GFRAA >60 06/03/2016 0217    Assessment:  1. Abdominal pain with bloody diarrhea. Highly likely acute flare of chronic ulcerative colitis.  Stool studies negative for infection. 2. Subjective fevers. 3. Blood loss anemia. 4. Abnormal CT scan, left-sided colitis. 5. Overall constellation of findings most consistent with ulcerative colitis flare.  Concomitant infection can't be ruled out. 6.  HIV antibody positive.  Plan:  1.  OK to transition to prednisone 40 mg po qd and discharge home today. 2.  I have discussed case with Dr. Megan Salon in Infectious Diseases.  We will wait on quantiferon gold results and HIV viral load.  However, unless quantiferon is positive, Dr. Megan Salon felt it was ok to start infliximab (or other immunosuppressive therapy) for patient's ulcerative colitis, especially given patient's CD4 count (> 700). 3.  Will arrange outpatient follow-up with me in the next 2-3 weeks. 4.  Will sign-off; please call with questions; thank you for the consultation.   Larry Lutz 06/03/2016, 2:31 PM   Pager [phone] If no answer or after 5 PM call [phone]

## 2016-06-04 LAB — QUANTIFERON IN TUBE
QFT TB AG MINUS NIL VALUE: 0 IU/mL
QUANTIFERON MITOGEN VALUE: 0.43 IU/mL
QUANTIFERON TB AG VALUE: 0.02 IU/mL
QUANTIFERON TB GOLD: UNDETERMINED
Quantiferon Nil Value: 0.02 IU/mL

## 2016-06-04 LAB — HIV-1 RNA QUANT-NO REFLEX-BLD
HIV 1 RNA QUANT: 20 {copies}/mL
LOG10 HIV-1 RNA: 1.301 log10copy/mL

## 2016-06-04 LAB — QUANTIFERON TB GOLD ASSAY (BLOOD)

## 2016-06-09 LAB — HLA B*5701: HLA B 5701: NEGATIVE

## 2016-06-28 ENCOUNTER — Ambulatory Visit: Payer: Self-pay | Admitting: Internal Medicine

## 2016-10-05 ENCOUNTER — Ambulatory Visit: Payer: Self-pay | Admitting: Infectious Diseases

## 2016-10-21 ENCOUNTER — Encounter (HOSPITAL_COMMUNITY): Payer: Self-pay | Admitting: Emergency Medicine

## 2016-10-21 ENCOUNTER — Emergency Department (HOSPITAL_COMMUNITY)
Admission: EM | Admit: 2016-10-21 | Discharge: 2016-10-21 | Disposition: A | Discharge status: Other Acute Inpatient Hospital | Payer: Self-pay | Attending: Emergency Medicine | Admitting: Emergency Medicine

## 2016-10-21 ENCOUNTER — Emergency Department (HOSPITAL_COMMUNITY): Payer: Self-pay

## 2016-10-21 ENCOUNTER — Inpatient Hospital Stay (HOSPITAL_COMMUNITY)
Admission: AD | Admit: 2016-10-21 | Discharge: 2016-10-23 | DRG: 885 | Disposition: A | Discharge status: Home / Self Care | Payer: Federal, State, Local not specified - Other | Source: Intra-hospital | Attending: Psychiatry | Admitting: Psychiatry

## 2016-10-21 ENCOUNTER — Encounter (HOSPITAL_COMMUNITY): Payer: Self-pay | Admitting: *Deleted

## 2016-10-21 DIAGNOSIS — Z21 Asymptomatic human immunodeficiency virus [HIV] infection status: Secondary | ICD-10-CM | POA: Insufficient documentation

## 2016-10-21 DIAGNOSIS — R45851 Suicidal ideations: Secondary | ICD-10-CM | POA: Insufficient documentation

## 2016-10-21 DIAGNOSIS — F4321 Adjustment disorder with depressed mood: Secondary | ICD-10-CM | POA: Diagnosis not present

## 2016-10-21 DIAGNOSIS — F419 Anxiety disorder, unspecified: Secondary | ICD-10-CM | POA: Diagnosis present

## 2016-10-21 DIAGNOSIS — Z862 Personal history of diseases of the blood and blood-forming organs and certain disorders involving the immune mechanism: Secondary | ICD-10-CM | POA: Diagnosis not present

## 2016-10-21 DIAGNOSIS — Z8669 Personal history of other diseases of the nervous system and sense organs: Secondary | ICD-10-CM

## 2016-10-21 DIAGNOSIS — F17210 Nicotine dependence, cigarettes, uncomplicated: Secondary | ICD-10-CM | POA: Diagnosis present

## 2016-10-21 DIAGNOSIS — G4700 Insomnia, unspecified: Secondary | ICD-10-CM | POA: Diagnosis present

## 2016-10-21 DIAGNOSIS — F1910 Other psychoactive substance abuse, uncomplicated: Secondary | ICD-10-CM | POA: Diagnosis not present

## 2016-10-21 DIAGNOSIS — F332 Major depressive disorder, recurrent severe without psychotic features: Secondary | ICD-10-CM | POA: Diagnosis not present

## 2016-10-21 DIAGNOSIS — F329 Major depressive disorder, single episode, unspecified: Secondary | ICD-10-CM | POA: Insufficient documentation

## 2016-10-21 DIAGNOSIS — Z7253 High risk bisexual behavior: Secondary | ICD-10-CM

## 2016-10-21 DIAGNOSIS — F1210 Cannabis abuse, uncomplicated: Secondary | ICD-10-CM | POA: Diagnosis present

## 2016-10-21 DIAGNOSIS — Z8719 Personal history of other diseases of the digestive system: Secondary | ICD-10-CM

## 2016-10-21 DIAGNOSIS — F418 Other specified anxiety disorders: Secondary | ICD-10-CM

## 2016-10-21 LAB — COMPREHENSIVE METABOLIC PANEL
ALBUMIN: 4.1 g/dL (ref 3.5–5.0)
ALT: 15 U/L — AB (ref 17–63)
ANION GAP: 10 (ref 5–15)
AST: 26 U/L (ref 15–41)
Alkaline Phosphatase: 70 U/L (ref 38–126)
BUN: 11 mg/dL (ref 6–20)
CO2: 25 mmol/L (ref 22–32)
Calcium: 9.1 mg/dL (ref 8.9–10.3)
Chloride: 105 mmol/L (ref 101–111)
Creatinine, Ser: 1.04 mg/dL (ref 0.61–1.24)
GFR calc non Af Amer: >60 mL/min (ref >60)
GLUCOSE: 115 mg/dL — AB (ref 65–99)
Potassium: 3.4 mmol/L — ABNORMAL LOW (ref 3.5–5.1)
SODIUM: 140 mmol/L (ref 135–145)
Total Bilirubin: 0.3 mg/dL (ref 0.3–1.2)
Total Protein: 7.3 g/dL (ref 6.5–8.1)

## 2016-10-21 LAB — CBC
HCT: 34.3 % — ABNORMAL LOW (ref 39.0–52.0)
HEMOGLOBIN: 10.1 g/dL — AB (ref 13.0–17.0)
MCH: 17.6 pg — ABNORMAL LOW (ref 26.0–34.0)
MCHC: 29.4 g/dL — ABNORMAL LOW (ref 30.0–36.0)
MCV: 59.9 fL — ABNORMAL LOW (ref 78.0–100.0)
Platelets: 438 10*3/uL — ABNORMAL HIGH (ref 150–400)
RBC: 5.73 MIL/uL (ref 4.22–5.81)
RDW: 22.1 % — ABNORMAL HIGH (ref 11.5–15.5)
WBC: 10.8 10*3/uL — ABNORMAL HIGH (ref 4.0–10.5)

## 2016-10-21 LAB — ETHANOL: Alcohol, Ethyl (B): <5 mg/dL (ref <5)

## 2016-10-21 LAB — ACETAMINOPHEN LEVEL

## 2016-10-21 LAB — RAPID URINE DRUG SCREEN, HOSP PERFORMED
AMPHETAMINES: NOT DETECTED
BENZODIAZEPINES: NOT DETECTED
Barbiturates: NOT DETECTED
COCAINE: NOT DETECTED
OPIATES: NOT DETECTED
TETRAHYDROCANNABINOL: POSITIVE — AB

## 2016-10-21 LAB — SALICYLATE LEVEL

## 2016-10-21 MED ORDER — ALUM & MAG HYDROXIDE-SIMETH 200-200-20 MG/5ML PO SUSP: 30.0000 mL | ORAL | Status: DC | PRN

## 2016-10-21 MED ORDER — ONDANSETRON HCL 4 MG PO TABS: 4.0000 mg | ORAL_TABLET | Freq: Three times a day (TID) | ORAL | Status: DC | PRN

## 2016-10-21 MED ORDER — IBUPROFEN 400 MG PO TABS: 600.0000 mg | ORAL_TABLET | Freq: Three times a day (TID) | ORAL | Status: DC | PRN

## 2016-10-21 MED ORDER — HYDROXYZINE HCL 25 MG PO TABS
25.0000 mg | ORAL_TABLET | Freq: Four times a day (QID) | ORAL | Status: DC | PRN
  Administered 2016-10-21: 25 mg via ORAL
  Filled 2016-10-21: qty 1
  Filled 2016-10-21: qty 10

## 2016-10-21 MED ORDER — MAGNESIUM HYDROXIDE 400 MG/5ML PO SUSP: 30.0000 mL | Freq: Every day | ORAL | Status: DC | PRN

## 2016-10-21 MED ORDER — TRAZODONE HCL 50 MG PO TABS
50.0000 mg | ORAL_TABLET | Freq: Every evening | ORAL | Status: DC | PRN
  Administered 2016-10-21: 50 mg via ORAL
  Filled 2016-10-21: qty 1
  Filled 2016-10-21: qty 10

## 2016-10-21 MED ORDER — ACETAMINOPHEN 325 MG PO TABS: 650.0000 mg | ORAL_TABLET | Freq: Four times a day (QID) | ORAL | Status: DC | PRN

## 2016-10-21 MED ORDER — ALUM & MAG HYDROXIDE-SIMETH 200-200-20 MG/5ML PO SUSP: 30.0000 mL | Freq: Four times a day (QID) | ORAL | Status: DC | PRN
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
## 2016-10-21 NOTE — Progress Notes (Signed)
Patient ID: Larry Lutz, male   DOB: Jul 05, 1990, 26 y.o.   MRN: [id number]  D: Patient observed watching TV, eating and interacting well with peers on approach. Pt reports since moving to [place] he has not able to see a provider nor restarted on his medications. Pt attended evening wrap up group and Interacted appropriately with peers. Denies  SI/HI/AVH and pain.No behavioral issues noted.  A: Support and encouragement offered as needed. Medications administered as prescribed.  R: Patient is safe and cooperative on unit. Will continue to monitor  for safety and stability.

## 2016-10-21 NOTE — ED Notes (Signed)
Received pt from 32, pt is calm and co operative, sleeping, breakfast ordered

## 2016-10-21 NOTE — Progress Notes (Signed)
Per Laddie Aquas, patient has been accepted to [facility], bed 307-1 ; Accepting provider is Marvia Pickles, FNP; Attending provider is Dr. Parke Poisson.   Patient can arrive at 12:30pm. Number for report is [phone].   Doy Mince, RN notified.    Radonna Ricker MSW, [place] Disposition [phone]

## 2016-10-21 NOTE — ED Notes (Signed)
Took patient vitals patient is resting waiting for transport

## 2016-10-21 NOTE — ED Notes (Signed)
Larry Lutz doing TTS

## 2016-10-21 NOTE — ED Notes (Signed)
Pt got breakfast
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
## 2016-10-21 NOTE — ED Notes (Signed)
pts belongings given to [place]

## 2016-10-21 NOTE — ED Notes (Signed)
Pod D will be bringing clothes

## 2016-10-21 NOTE — ED Notes (Signed)
Sister called and said their grandfather passed, pt doesn't know

## 2016-10-21 NOTE — BH Assessment (Addendum)
Tele Assessment Note   Larry Lutz is an 26 y.o. male. Pt is currently denying SI/HI and AVH but the Pt informed his sister and significant other that he was suicidal. Pt states he took 3 Tylenol pills in SI attempt. Pt states "I regret it." Pt reports conflict with his significant other. Pt states Pt denies current or past mental health treatment.  Pt denies current mental health medication. Pt reports daily marijuana use. Pt denies alcohol use. Pt appeared drowsy throughout the assessment. Per Pt he is has been able to sleep or eat due to the conflict with his significant other.   Diagnosis:  F33.2 MDD   Past Medical History:  Past Medical History:  Diagnosis Date  . Colitis     Past Surgical History:  Procedure Laterality Date  . BRAIN SURGERY      Family History:  Family History  Problem Relation Age of Onset  . Ulcerative colitis Mother     Social History:  reports that he has quit smoking. He has never used smokeless tobacco. He reports that he does not drink alcohol or use drugs.  Additional Social History:  Alcohol / Drug Use Pain Medications: please see mar Prescriptions: please see mar Over the Counter: please see mar History of alcohol / drug use?: Yes Longest period of sobriety (when/how long): unknown Substance #1 Name of Substance 1: marijuana 1 - Age of First Use: unknown 1 - Amount (size/oz): unknown 1 - Frequency: daily 1 - Duration: ongoing 1 - Last Use / Amount: 10/20/16  CIWA: CIWA-Ar BP: (!) 145/95 Pulse Rate: 89 COWS:    PATIENT STRENGTHS: (choose at least two) Average or above average intelligence Communication skills  Allergies: No Known Allergies  Home Medications:  (Not in a hospital admission)  OB/GYN Status:  No LMP for male patient.  General Assessment Data Location of Assessment: [facility] ED TTS Assessment: In system Is this a Tele or Face-to-Face Assessment?: Tele Assessment Is this an Initial Assessment or a Re-assessment for this  encounter?: Initial Assessment Marital status: Single Maiden name: NA Is patient pregnant?: No Pregnancy Status: No Living Arrangements: Other relatives Can pt return to current living arrangement?: Yes Admission Status: Voluntary Is patient capable of signing voluntary admission?: Yes Referral Source: Self/Family/Friend Insurance type: SP     Crisis Care Plan Living Arrangements: Other relatives Legal Guardian: Other: (self) Name of Psychiatrist: NA Name of Therapist: NA  Education Status Is patient currently in school?: No Current Grade: NA Highest grade of school patient has completed: some college Name of school: NA Contact person: NA  Risk to self with the past 6 months Suicidal Ideation: Yes-Currently Present Has patient been a risk to self within the past 6 months prior to admission? : No Suicidal Intent: Yes-Currently Present Has patient had any suicidal intent within the past 6 months prior to admission? : No Is patient at risk for suicide?: Yes Suicidal Plan?: Yes-Currently Present Has patient had any suicidal plan within the past 6 months prior to admission? : No Specify Current Suicidal Plan: Pt attempted to overdose Access to Means: Yes Specify Access to Suicidal Means: access to pills What has been your use of drugs/alcohol within the last 12 months?: [email or web address] Previous Attempts/Gestures: No How many times?: 0 Other Self Harm Risks: NA Triggers for Past Attempts: None known Intentional Self Injurious Behavior: None Family Suicide History: No Recent stressful life event(s): Conflict (Comment) Persecutory voices/beliefs?: No Depression: Yes Depression Symptoms: Tearfulness, Loss of interest in usual pleasures, Feeling  worthless/self pity, Feeling angry/irritable, Guilt Substance abuse history and/or treatment for substance abuse?: No Suicide prevention information given to non-admitted patients: Not applicable  Risk to Others within the past 6  months Homicidal Ideation: No Does patient have any lifetime risk of violence toward others beyond the six months prior to admission? : No Thoughts of Harm to Others: Yes-Currently Present Comment - Thoughts of Harm to Others: Pt states he thinks about fighting others Current Homicidal Intent: No Current Homicidal Plan: No Access to Homicidal Means: No Identified Victim: NA History of harm to others?: No Assessment of Violence: None Noted Violent Behavior Description: NA Does patient have access to weapons?: No Criminal Charges Pending?: No Does patient have a court date: No Is patient on probation?: Yes (Pt states for evading arrest)  Psychosis Hallucinations: None noted Delusions: None noted  Mental Status Report Appearance/Hygiene: Unremarkable Eye Contact: Fair Motor Activity: Freedom of movement Speech: Logical/coherent Level of Consciousness: Alert Mood: Depressed Affect: Depressed Anxiety Level: Minimal Thought Processes: Coherent, Relevant Judgement: Impaired Orientation: Person, Place, Time, Situation Obsessive Compulsive Thoughts/Behaviors: None  Cognitive Functioning Concentration: Normal Memory: Recent Intact, Remote Intact IQ: Average Insight: Fair Impulse Control: Fair Appetite: Poor Weight Loss: 0 Weight Gain: 0 Sleep: Decreased Total Hours of Sleep: 5 Vegetative Symptoms: None  ADLScreening [facility] Assessment Services) Patient's cognitive ability adequate to safely complete daily activities?: Yes Patient able to express need for assistance with ADLs?: Yes Independently performs ADLs?: Yes (appropriate for developmental age)  Prior Inpatient Therapy Prior Inpatient Therapy: No Prior Therapy Dates: NA Prior Therapy Facilty/Provider(s): NA Reason for Treatment: NA  Prior Outpatient Therapy Prior Outpatient Therapy: No Prior Therapy Dates: NA Prior Therapy Facilty/Provider(s): NA Reason for Treatment: NA Does patient have an ACCT team?:  No Does patient have Intensive In-House Services?  : No Does patient have [facility] services? : No Does patient have P4CC services?: No  ADL Screening (condition at time of admission) Patient's cognitive ability adequate to safely complete daily activities?: Yes Is the patient deaf or have difficulty hearing?: No Does the patient have difficulty seeing, even when wearing glasses/contacts?: No Does the patient have difficulty concentrating, remembering, or making decisions?: No Patient able to express need for assistance with ADLs?: Yes Does the patient have difficulty dressing or bathing?: No Independently performs ADLs?: Yes (appropriate for developmental age) Does the patient have difficulty walking or climbing stairs?: No Weakness of Legs: None Weakness of Arms/Hands: None       Abuse/Neglect Assessment (Assessment to be complete while patient is alone) Physical Abuse: Denies Verbal Abuse: Denies Sexual Abuse: Denies Exploitation of patient/patient's resources: Denies Self-Neglect: Denies     Regulatory affairs officer (For Healthcare) Does Patient Have a Medical Advance Directive?: No    Additional Information 1:1 In Past 12 Months?: No CIRT Risk: No Elopement Risk: No Does patient have medical clearance?: Yes     Disposition:  Disposition Initial Assessment Completed for this Encounter: Yes Disposition of Patient: Inpatient treatment program Type of inpatient treatment program: Adult  Larry Lutz 10/21/2016 10:37 AM

## 2016-10-21 NOTE — ED Notes (Signed)
Sister at [place] or [place]
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
## 2016-10-22 ENCOUNTER — Telehealth: Payer: Self-pay | Admitting: Infectious Disease

## 2016-10-22 DIAGNOSIS — F418 Other specified anxiety disorders: Secondary | ICD-10-CM

## 2016-10-22 DIAGNOSIS — F17210 Nicotine dependence, cigarettes, uncomplicated: Secondary | ICD-10-CM

## 2016-10-22 DIAGNOSIS — F4321 Adjustment disorder with depressed mood: Secondary | ICD-10-CM
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
## 2016-10-22 NOTE — Progress Notes (Signed)
Recreation Therapy Notes  Date: 10/22/2016 Time: 9:30am Location: [address] Dayroom  Group Topic: Stress Management  Goal Area(s) Addresses:  Patient will verbalize importance of using healthy stress management.  Patient will identify positive emotions associated with healthy stress management.   Intervention: Stress Management  Activity :  Guided Meditation. Recreation Therapy Intern introduced the stress management technique of guided meditation. Recreation Therapy Intern read a script that allowed patients to reach into their inner "chakra." Recreation Therapy Intern played calming meditation music. Patients were to follow along as script was read to engage in the activity.  Education: Stress Management, Discharge Planning.   Education Outcome: Needs additional education  Clinical Observations/Feedback: Pt did not attend group.  Donovan Kail, Recreation Therapy Intern

## 2016-10-22 NOTE — [facility] Suicide Risk Assessment (Signed)
[place] INPATIENT:  Family/Significant Other Suicide Prevention Education  Suicide Prevention Education:  Education Completed; Larry Lutz (sister [phone]), has been identified by the patient as the family member/significant other with whom the patient will be residing, and identified as the person(s) who will aid the patient in the event of a mental health crisis (suicidal ideations/suicide attempt).  With written consent from the patient, the family member/significant other has been provided the following suicide prevention education, prior to the and/or following the discharge of the patient.  The suicide prevention education provided includes the following:  Suicide risk factors  Suicide prevention and interventions  National Suicide Hotline telephone number  [facility] assessment telephone number  [facility] Emergency Assistance [place] and/or Residential Mobile Crisis Unit telephone number  Request made of family/significant other to:  Remove weapons (e.g., guns, rifles, knives), all items previously/currently identified as safety concern.    Remove drugs/medications (over-the-counter, prescriptions, illicit drugs), all items previously/currently identified as a safety concern.  The family member/significant other verbalizes understanding of the suicide prevention education information provided.  The family member/significant other agrees to remove the items of safety concern listed above.  Pt's sister states that she thinks that pt is stable for discharge. However, pt's sister believes that pt would benefit from an additional day in the hospital. Pt's sister also states that his pt's best friend died a few years ago and this still bothers the pt. Pt's sister would like for pt to continue treatment on an outpatient basis. However, pt is currently declining outpatient follow-up. Pt's sister confirms that pt does not have access to guns or weapons at home. Pt  will be going to live with his sister upon discharge. Pt's sister is unsure if she will be able to pick pt up tomorrow and is debating about a Sunday discharge for pt.  Larry Lutz, MSW, LCSWA  10/22/2016, 4:33 PM
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
## 2016-10-22 NOTE — Telephone Encounter (Signed)
[facility] regarding this patient who was seen previously by Dr. Megan Salon. He has been at behavioral health and isn't interested in re-engaging care for his HIV.  Interestingly his HIV viral load was not detectable (<20  on March 21 and then reported at 20 on repeat testing the following day.  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  He saw Dr. Megan Salon in March

## 2016-10-22 NOTE — Progress Notes (Addendum)
Patient ID: Larry Lutz, male   DOB: March 19, 1990, 26 y.o.   MRN: [id number]  DAR: Pt. Denies SI/HI and A/V Hallucinations. He reports sleep is good, appetite is good, energy level is normal, and concentration is good. He rates depression, hopelessness, and anxiety 0/10. Patient does not report any pain or discomfort at this time. Support and encouragement provided to the patient. No scheduled medications to administer at this time. Patient's BP is elevated, NP Darnelle Maffucci NP notified. Patient is asymptomatic at this time. Patient is minimal with Probation officer but cooperative. Patient met with his sister this afternoon and was notified that his grand father died. Patient reported that he is okay even though it is sad news. He reports his goal is to work on his attitude. He is seen in the milieu and is interacting with some of his peers. Q15 minute checks are maintained for safety.

## 2016-10-22 NOTE — [facility] Group Notes (Signed)
[facility] LCSW Group Therapy 10/22/2016 1:15pm  Type of Therapy: Group Therapy- Feelings Around Relapse and Recovery  Participation Level: Active   Participation Quality:  Appropriate  Affect:  Appropriate  Cognitive: Alert and Oriented   Insight:  Developing   Engagement in Therapy: Developing/Improving and Engaged   Modes of Intervention: Clarification, Confrontation, Discussion, Education, Exploration, Limit-setting, Orientation, Problem-solving, Rapport Building, Reality Testing, Socialization and Support  Summary of Progress/Problems: The topic for today was feelings about relapse. The group discussed what relapse prevention is to them and identified triggers that they are on the path to relapse. Members also processed their feeling towards relapse and were able to relate to common experiences. Group also discussed coping skills that can be used for relapse prevention.     Therapeutic Modalities:   Cognitive Behavioral Therapy Solution-Focused Therapy Assertiveness Training Relapse Prevention Therapy    Jayme Mednick, MSW, LCSWA [phone] 10/22/2016 4:43 PM   

## 2016-10-23 ENCOUNTER — Encounter (HOSPITAL_COMMUNITY): Payer: Self-pay | Admitting: Behavioral Health

## 2016-10-23 DIAGNOSIS — F332 Major depressive disorder, recurrent severe without psychotic features: Principal | ICD-10-CM

## 2016-10-23 DIAGNOSIS — F1910 Other psychoactive substance abuse, uncomplicated: Secondary | ICD-10-CM

## 2016-10-23 MED ORDER — TRAZODONE HCL 50 MG PO TABS: 50.0000 mg | ORAL_TABLET | Freq: Every evening | ORAL | 0 refills | Status: DC | PRN

## 2016-10-23 MED ORDER — HYDROXYZINE HCL 25 MG PO TABS: 25.0000 mg | ORAL_TABLET | Freq: Four times a day (QID) | ORAL | 0 refills | Status: DC | PRN

## 2016-10-23 NOTE — Progress Notes (Signed)
  [facility] Adult Case Management Discharge Plan :  Will you be returning to the same living situation after discharge:  No.  Moving in with sister At discharge, do you have transportation home?: Yes,  sister Do you have the ability to pay for your medications: Yes,  no barriers identified  Release of information consent forms completed and turned in to Medical Records.  He declines to sign a release of information for [facility].   Patient to Follow up at: Follow-up Information    Pt declines mental health follow-up at this time Follow up.        [facility] Follow up.   Specialty:  Behavioral Health Why:  It is your right to decline mental health follow-up. However, in the event that you experience a mental health crisis after discharge please go to [facility] to be seen as a walk-in. Walk-in hours are Mon-Fri 8a-3pm.  Contact information: [place] [address] [phone]           Next level of care provider has access to [place] and Suicide Prevention discussed: Yes,  with sister and with patient  Have you used any form of tobacco in the last 30 days? (Cigarettes, Smokeless Tobacco, Cigars, and/or Pipes): Yes  Has patient been referred to the Quitline?: Patient refused referral  Patient has been referred for addiction treatment: Pt. refused referral  Maretta Los, LCSW 10/23/2016, 9:37 AM

## 2016-10-23 NOTE — Progress Notes (Signed)
Writer spoke with patient who has been isolative to his room tonight to inform him that his sister called him and would like for him to return her call. Writer spoke with patient 1:1 and he denies having any pain, -si/hi/a/v hallucinations. He reports that he is aware that his sister is wanting him to remain here until Sunday and patient reports being fine with her decision.  He was informed of medications available if needed but he declined. Support givne and safety maintained on unit with 15 min checks.

## 2016-10-23 NOTE — [facility] Group Notes (Signed)
[place] LCSW Group Therapy Note  10/16/2016 10:15 to 11:15 AM  Type of Therapy and Topic:  Group Therapy: Avoiding Self-Sabotaging and Enabling Behaviors  Participation Level:  Did Not Attend; invited to participate yet did not despite overhead announcement and encouragement by CSW who went into patient's room before group began.  Description of Group The main focus of today's process group to discuss what "self-sabotage" means and use motivational iInterviewing to discuss what benefits, negative or positive, were involved in a self-identified self-sabotaging behavior. We then talked about reasons the patient may want to change the behavior and their current desire to change.   Therapeutic molalities: Cognitive Behavioral Therapy Person-Centered Therapy Motivational Interviewing  Therapeutic Goals: 1. Patients will demonstrate understanding of the concept of self sabotage 2. Patients will be able to identify pros and cons of their behaviors 3. Patients will be able to identify at least two motivating factors for l of their desire for change   Sheilah Pigeon, LCSW
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
## 2016-10-23 NOTE — BHH Suicide Risk Assessment (Signed)
[facility] Discharge Suicide Risk Assessment   Principal Problem: Adjustment disorder with depressed mood Discharge Diagnoses:  Patient Active Problem List   Diagnosis Date Noted  . Adjustment disorder with depressed mood [F43.21]   . HIV antibody positive ([place]) [Z21] 06/02/2016  . Ulcerative colitis, acute ([place]) [K51.90] 06/01/2016  . Gastrointestinal hemorrhage [K92.2] 06/01/2016  . Acute blood loss anemia [D62] 06/01/2016    Total Time spent with patient: 30 minutes  Musculoskeletal: Strength & Muscle Tone: within normal limits Gait & Station: normal Patient leans: N/A  Psychiatric Specialty Exam: Review of Systems  Psychiatric/Behavioral: Negative for depression and suicidal ideas.  All other systems reviewed and are negative.   Blood pressure 137/85, pulse 63, temperature 98.9 F (37.2 C), temperature source Oral, resp. rate 18, height 5' 7"  (1.702 m), weight 64.4 kg (142 lb), SpO2 100 %.Body mass index is 22.24 kg/m.  General Appearance: Casual  Eye Contact::  Fair  Speech:  Normal Rate409  Volume:  Normal  Mood:  Euthymic  Affect:  Congruent  Thought Process:  Goal Directed and Descriptions of Associations: Circumstantial  Orientation:  Full (Time, Place, and Person)  Thought Content:  Logical  Suicidal Thoughts:  No  Homicidal Thoughts:  No  Memory:  Immediate;   Fair Recent;   Fair Remote;   Fair  Judgement:  Fair  Insight:  Fair  Psychomotor Activity:  Normal  Concentration:  Fair  Recall:  [employer] of Knowledge:Fair  Language: Fair  Akathisia:  No  Handed:  Right  AIMS (if indicated):     Assets:  Communication Skills Desire for Improvement  Sleep:  Number of Hours: 6.75  Cognition: WNL  ADL's:  Intact   Mental Status Per Nursing Assessment::   On Admission:     Demographic Factors:  Male  Loss Factors: NA  Historical Factors: Impulsivity  Risk Reduction Factors:   Positive social support and Positive therapeutic relationship  Continued  Clinical Symptoms:  denies  Cognitive Features That Contribute To Risk:  None    Suicide Risk:  Minimal: No identifiable suicidal ideation.  Patients presenting with no risk factors but with morbid ruminations; may be classified as minimal risk based on the severity of the depressive symptoms  Follow-up Information    Pt declines mental health follow-up at this time Follow up.        [facility] Follow up.   Specialty:  Behavioral Health Why:  It is your right to decline mental health follow-up. However, in the event that you experience a mental health crisis after discharge please go to [facility] to be seen as a walk-in. Walk-in hours are Mon-Fri 8a-3pm.  Contact information: [place]  [address] [phone]           Plan Of Care/Follow-up recommendations:  Activity:  no restrictions Diet:  regular Tests:  as needed Other:  follow up with aftercare  Takeela Peil, MD 10/23/2016, 9:38 AM

## 2016-11-01 ENCOUNTER — Other Ambulatory Visit: Payer: Self-pay

## 2016-11-08 ENCOUNTER — Ambulatory Visit: Payer: Self-pay | Admitting: Infectious Diseases

## 2016-12-12 ENCOUNTER — Emergency Department (HOSPITAL_COMMUNITY)
Admission: EM | Admit: 2016-12-12 | Discharge: 2016-12-12 | Discharge status: Against Medical Advice | Payer: Self-pay | Attending: Emergency Medicine | Admitting: Emergency Medicine

## 2016-12-12 ENCOUNTER — Encounter (HOSPITAL_COMMUNITY): Payer: Self-pay | Admitting: Emergency Medicine

## 2016-12-12 DIAGNOSIS — B20 Human immunodeficiency virus [HIV] disease: Secondary | ICD-10-CM | POA: Insufficient documentation

## 2016-12-12 DIAGNOSIS — M79601 Pain in right arm: Secondary | ICD-10-CM | POA: Insufficient documentation

## 2016-12-12 DIAGNOSIS — I10 Essential (primary) hypertension: Secondary | ICD-10-CM | POA: Insufficient documentation

## 2016-12-12 DIAGNOSIS — Z5320 Procedure and treatment not carried out because of patient's decision for unspecified reasons: Secondary | ICD-10-CM | POA: Insufficient documentation

## 2016-12-12 HISTORY — DX: Asymptomatic human immunodeficiency virus (hiv) infection status: Z21

## 2016-12-12 HISTORY — DX: Essential (primary) hypertension: I10

## 2016-12-12 HISTORY — DX: Human immunodeficiency virus (HIV) disease: B20

## 2016-12-12 NOTE — ED Notes (Signed)
Pt was informed he was waiting for Dr. Kathrynn Humble to come see him.  Pt states he doesn't want to wait much longer.  Pt was informed the risks of him leaving.  Pt expressed understanding.  Pt continues to decide to leave the hospital.

## 2016-12-12 NOTE — ED Provider Notes (Signed)
[facility] DEPT Provider Note   CSN: [id number] Arrival date & time: 12/12/16  1454     History   Chief Complaint Chief Complaint  Patient presents with  . Arm Pain    HPI Larry Lutz is a 26 y.o. male with hx of HIV, HTN and colitis who presents to the ED with right arm pain that started over 2 weeks ago. The patient reports that a friend injected "Molly" into the patient's [facility] and after that he has had pain that starts at the injection site and goes to the thumb and index finger. Patient reports that the pain has gotten worse and is severe.   HPI  Past Medical History:  Diagnosis Date  . Colitis   . HIV (human immunodeficiency virus infection) ([place])   . Hypertension     Patient Active Problem List   Diagnosis Date Noted  . Adjustment disorder with depressed mood   . HIV antibody positive ([place]) 06/02/2016  . Ulcerative colitis, acute ([place]) 06/01/2016  . Gastrointestinal hemorrhage 06/01/2016  . Acute blood loss anemia 06/01/2016    Past Surgical History:  Procedure Laterality Date  . BRAIN SURGERY         Home Medications    Prior to Admission medications   Medication Sig Start Date End Date Taking? Authorizing Provider  hydrOXYzine (ATARAX/VISTARIL) 25 MG tablet Take 1 tablet (25 mg total) by mouth every 6 (six) hours as needed for anxiety. 10/23/16   Mordecai Maes, NP  traZODone (DESYREL) 50 MG tablet Take 1 tablet (50 mg total) by mouth at bedtime as needed for sleep. 10/23/16   Mordecai Maes, NP    Family History Family History  Problem Relation Age of Onset  . Ulcerative colitis Mother     Social History Social History  Substance Use Topics  . Smoking status: Smoker, Current Status Unknown  . Smokeless tobacco: Never Used     Comment: pt. reports "occassional cigarette smoking"  . Alcohol use No     Allergies   Patient has no known allergies.   Review of Systems Review of Systems  Constitutional: Positive for chills and fever  (99.9).  HENT: Negative.   Eyes: Negative for visual disturbance.  Respiratory: Negative for chest tightness and shortness of breath.   Cardiovascular: Negative for chest pain.  Gastrointestinal: Negative for abdominal pain, nausea and vomiting.  Musculoskeletal: Positive for arthralgias and joint swelling.  Skin: Positive for wound.  Psychiatric/Behavioral: Negative for confusion. The patient is not nervous/anxious.      Physical Exam Updated Vital Signs BP (!) 156/98 Comment: Pt reports he used to take BP meds but does not take any now.  Pulse 91   Temp 98.1 F (36.7 C) (Oral)   Resp 18   Ht 5' 8"  (1.727 m)   Wt 71.3 kg (157 lb 3.2 oz)   SpO2 100%   BMI 23.90 kg/m   Physical Exam  Constitutional: He is oriented to person, place, and time. He appears well-developed and well-nourished. No distress.  HENT:  Head: Normocephalic and atraumatic.  Eyes: EOM are normal.  Neck: Normal range of motion. Neck supple.  Cardiovascular: Normal rate and regular rhythm.   Pulmonary/Chest: Effort normal and breath sounds normal.  Abdominal: Soft. There is no tenderness.  Musculoskeletal:  Puncture site to the right AC. Tender on palpation, radial pulse 2+, adequate circulation. Negative finger to thumb opposition. Tender with palpation of the thumb and index finger. No erythema, no red streaking  Neurological: He is alert  and oriented to person, place, and time. No cranial nerve deficit.  Skin: Skin is warm and dry.  Psychiatric: He has a normal mood and affect.  Nursing note and vitals reviewed.    ED Treatments / Results  Labs (all labs ordered are listed, but only abnormal results are displayed) Labs Reviewed - No data to display  Radiology No results found.  Procedures Procedures (including critical care time)  Medications Ordered in ED Medications - No data to display   Initial Impression / Assessment and Plan / ED Course  I have reviewed the triage vital signs and the  nursing notes. Discussed with the patient that he may need f/u with hand surgeon to do nerve conduction study and that I wanted to have Dr. Kathrynn Humble to see him.   Dr. Kathrynn Humble in to see the patient and patient had left.  The RN had discussed AMA d/c with the patient.   Final Clinical Impressions(s) / ED Diagnoses   Final diagnoses:  Right arm pain    New Prescriptions Discharge Medication List as of 12/12/2016  8:20 PM       Debroah Baller [place], NP 12/12/16 2215    Varney Biles, MD 12/13/16 0010

## 2016-12-12 NOTE — ED Triage Notes (Addendum)
Patient report right arm pain after injecting molly into right AC approx 3 week ago. Reports "burning and tingling" to right hand. Movement and sensation to all fingers. Denies fevers, N/V/D, chest pain and SOB.

## 2017-01-05 ENCOUNTER — Ambulatory Visit (INDEPENDENT_AMBULATORY_CARE_PROVIDER_SITE_OTHER): Payer: Self-pay | Admitting: Internal Medicine

## 2017-01-05 VITALS — BP 124/82 | HR 69 | Temp 98.0°F | Wt 161.4 lb

## 2017-01-05 DIAGNOSIS — Z21 Asymptomatic human immunodeficiency virus [HIV] infection status: Secondary | ICD-10-CM | POA: Insufficient documentation

## 2017-01-05 DIAGNOSIS — B20 Human immunodeficiency virus [HIV] disease: Secondary | ICD-10-CM

## 2017-01-05 DIAGNOSIS — Z23 Encounter for immunization: Secondary | ICD-10-CM

## 2017-01-05 NOTE — Progress Notes (Signed)
Patient ID: Larry Lutz, male    DOB: 1990-11-12, 26 y.o.   MRN: [id number]  Reason for visit: to establish care as a new patient with HIV  HPI:   Patient was first diagnosed earlier this year during screening during his hospitalization for UC flare in March 2018.  He was seen then by my partner Dr. Megan Salon but did not return for follow up.  He also has not seen GI since then.  He is currently trying to get established with a pcp.  He endorses MSM.  The CD4 count is 760, viral load 20.  There have been no associated symptoms.  He does remember a blister on his lip at the time of diagnosis (?UC-related).  No weight loss, no diarrhea now.  Saw Dr. Paulita Fujita in the hospital.  Was considering therapy.  Quantiferon was indeterminant (on steroids).  HLA [id number] negative.   Past Medical History:  Diagnosis Date  . Colitis   . HIV (human immunodeficiency virus infection) ([place])   . Hypertension     Prior to Admission medications   Medication Sig Start Date End Date Taking? Authorizing Provider  hydrOXYzine (ATARAX/VISTARIL) 25 MG tablet Take 1 tablet (25 mg total) by mouth every 6 (six) hours as needed for anxiety. Patient not taking: Reported on 01/05/2017 10/23/16   Mordecai Maes, NP  traZODone (DESYREL) 50 MG tablet Take 1 tablet (50 mg total) by mouth at bedtime as needed for sleep. Patient not taking: Reported on 01/05/2017 10/23/16   Mordecai Maes, NP    No Known Allergies  Social History  Substance Use Topics  . Smoking status: Smoker, Current Status Unknown  . Smokeless tobacco: Never Used     Comment: pt. reports "occassional cigarette smoking"  . Alcohol use No    Family History  Problem Relation Age of Onset  . Ulcerative colitis Mother   DM in maternal GF HTN in mother and father and sister  Review of Systems Constitutional: negative for fatigue and malaise Gastrointestinal: negative for diarrhea Integument/breast: negative for rash All other systems reviewed and are  negative    CONSTITUTIONAL:in no apparent distress and alert  Vitals:   01/05/17 1345  BP: 124/82  Pulse: 69  Temp: 98 F (36.7 C)   EYES: anicteric HENT: no thrush CARD:Cor RRR RESP:CTA B; normal respiratory effort WY:OVZCH sounds are normal, liver is not enlarged, spleen is not enlarged MS:no pedal edema noted SKIN:no rashes NEURO: non focal  Lab Results  Component Value Date   HIV1RNAQUANT 20 06/03/2016   No components found for: HIV1GENOTYPRPLUS No components found for: THELPERCELL  Assessment: new patient here with HIV to establish in clinic after being seen inpatient.  Discussed with patient treatment options and side effects, benefits of treatment, long term outcomes.  I discussed the severity of untreated HIV including higher cancer risk, opportunistic infections, renal failure.  Also discussed needing to use condoms, partner disclosure, necessary vaccines, blood monitoring.  I also discussed the nature of an [employer] and indications for treatment and is someone like him there is no absolute indication to start ARVs.  All questions answered.    Plan: 1) labs today 2) establish with a PCP 3) pneumovax and vaccine counseling 4) RTC 3 months.

## 2017-01-06 LAB — T-HELPER CELL (CD4) - (RCID CLINIC ONLY)
CD4 % Helper T Cell: 21 % — ABNORMAL LOW (ref 33–55)
CD4 T Cell Abs: 580 /uL (ref 400–2700)

## 2017-01-07 ENCOUNTER — Telehealth: Payer: Self-pay | Admitting: *Deleted

## 2017-01-07 ENCOUNTER — Encounter: Payer: Self-pay | Admitting: Internal Medicine

## 2017-01-07 ENCOUNTER — Other Ambulatory Visit: Payer: Self-pay | Admitting: Internal Medicine

## 2017-01-07 DIAGNOSIS — B20 Human immunodeficiency virus [HIV] disease: Secondary | ICD-10-CM

## 2017-01-07 LAB — COMPLETE METABOLIC PANEL WITH GFR
AG RATIO: 1.4 (calc) (ref 1.0–2.5)
ALBUMIN MSPROF: 4.5 g/dL (ref 3.6–5.1)
ALT: 18 U/L (ref 9–46)
AST: 26 U/L (ref 10–40)
Alkaline phosphatase (APISO): 94 U/L (ref 40–115)
BILIRUBIN TOTAL: 0.7 mg/dL (ref 0.2–1.2)
BUN: 11 mg/dL (ref 7–25)
CHLORIDE: 101 mmol/L (ref 98–110)
CO2: 30 mmol/L (ref 20–32)
Calcium: 9.7 mg/dL (ref 8.6–10.3)
Creat: 0.99 mg/dL (ref 0.60–1.35)
GFR, EST AFRICAN AMERICAN: 120 mL/min/{1.73_m2} (ref >=60)
GFR, Est Non African American: 104 mL/min/{1.73_m2} (ref >=60)
Globulin: 3.3 g/dL (calc) (ref 1.9–3.7)
Glucose, Bld: 80 mg/dL (ref 65–99)
POTASSIUM: 4.7 mmol/L (ref 3.5–5.3)
Sodium: 137 mmol/L (ref 135–146)
TOTAL PROTEIN: 7.8 g/dL (ref 6.1–8.1)

## 2017-01-07 LAB — LIPID PANEL
CHOLESTEROL: 236 mg/dL — AB (ref <200)
HDL: 45 mg/dL (ref >40)
LDL Cholesterol (Calc): 163 mg/dL (calc) — ABNORMAL HIGH
Non-HDL Cholesterol (Calc): 191 mg/dL (calc) — ABNORMAL HIGH (ref <130)
TRIGLYCERIDES: 138 mg/dL (ref <150)
Total CHOL/HDL Ratio: 5.2 (calc) — ABNORMAL HIGH (ref <5.0)

## 2017-01-07 LAB — QUANTIFERON TB GOLD ASSAY (BLOOD)
QUANTIFERON(R)-TB GOLD: NEGATIVE
Quantiferon Nil Value: 0.36 IU/mL
Quantiferon Tb Ag Minus Nil Value: 0.07 IU/mL

## 2017-01-07 LAB — HEPATITIS B CORE ANTIBODY, TOTAL: Hep B Core Total Ab: NONREACTIVE

## 2017-01-07 LAB — CBC WITH DIFFERENTIAL/PLATELET
BASOS ABS: 30 {cells}/uL (ref 0–200)
BASOS PCT: 0.4 %
EOS ABS: 178 {cells}/uL (ref 15–500)
Eosinophils Relative: 2.4 %
HCT: 39.5 % (ref 38.5–50.0)
HEMOGLOBIN: 12 g/dL — AB (ref 13.2–17.1)
LYMPHS ABS: 2553 {cells}/uL (ref 850–3900)
MCH: 19.2 pg — AB (ref 27.0–33.0)
MCHC: 30.4 g/dL — ABNORMAL LOW (ref 32.0–36.0)
MCV: 63.3 fL — AB (ref 80.0–100.0)
MONOS PCT: 10.3 %
MPV: 9.6 fL (ref 7.5–12.5)
NEUTROS ABS: 3878 {cells}/uL (ref 1500–7800)
Neutrophils Relative %: 52.4 %
PLATELETS: 391 10*3/uL (ref 140–400)
RBC: 6.24 10*6/uL — ABNORMAL HIGH (ref 4.20–5.80)
RDW: 21.3 % — ABNORMAL HIGH (ref 11.0–15.0)
Total Lymphocyte: 34.5 %
WBC: 7.4 10*3/uL (ref 3.8–10.8)
WBCMIX: 762 {cells}/uL (ref 200–950)

## 2017-01-07 LAB — HEPATITIS B SURFACE ANTIBODY,QUALITATIVE: Hep B S Ab: REACTIVE — AB

## 2017-01-07 LAB — HEPATITIS B SURFACE ANTIGEN: HEP B S AG: NONREACTIVE

## 2017-01-07 LAB — HEPATITIS A ANTIBODY, TOTAL: Hepatitis A AB,Total: REACTIVE — AB

## 2017-01-07 LAB — URINE CYTOLOGY ANCILLARY ONLY
Chlamydia: NEGATIVE
Neisseria Gonorrhea: NEGATIVE

## 2017-01-07 LAB — RPR: RPR: NONREACTIVE

## 2017-01-07 NOTE — Telephone Encounter (Signed)
-----   Message from Thayer Headings, MD sent at 01/07/2017 11:12 AM EDT ----- Please let him know that his virus is now detectable so he will need to get on treatment.  Have him come back and see me or PharmD asap. thanks

## 2017-01-07 NOTE — Telephone Encounter (Signed)
Attempted to call patient; no answer and no voice mail. Larry Lutz

## 2017-01-11 NOTE — Addendum Note (Signed)
Addended byMeriel Pica F on: 01/11/2017 09:09 AM   Modules accepted: Orders

## 2017-01-14 ENCOUNTER — Encounter: Payer: Self-pay | Admitting: Licensed Clinical Social Worker

## 2017-01-19 LAB — HIV-1 RNA ULTRAQUANT REFLEX TO GENTYP+
HIV 1 RNA Quant: 92200 Copies/mL — ABNORMAL HIGH
HIV-1 RNA Quant, Log: 4.96 Log cps/mL — ABNORMAL HIGH

## 2017-01-19 LAB — HIV-1 RNA QUANT-NO REFLEX-BLD
HIV 1 RNA QUANT: 104000 {copies}/mL — AB
HIV-1 RNA Quant, Log: 5.02 Log copies/mL — ABNORMAL HIGH

## 2017-01-19 LAB — TEST AUTHORIZATION

## 2017-01-19 LAB — RFLX HIV-1 INTEGRASE GENOTYPE: HIV-1 Genotype: DETECTED — AB

## 2017-01-27 ENCOUNTER — Ambulatory Visit (INDEPENDENT_AMBULATORY_CARE_PROVIDER_SITE_OTHER): Payer: Self-pay | Admitting: Infectious Diseases

## 2017-01-27 VITALS — Wt 162.0 lb

## 2017-01-27 DIAGNOSIS — M79641 Pain in right hand: Secondary | ICD-10-CM

## 2017-01-27 DIAGNOSIS — B20 Human immunodeficiency virus [HIV] disease: Secondary | ICD-10-CM

## 2017-01-27 DIAGNOSIS — F329 Major depressive disorder, single episode, unspecified: Secondary | ICD-10-CM

## 2017-01-27 DIAGNOSIS — A64 Unspecified sexually transmitted disease: Secondary | ICD-10-CM

## 2017-01-27 DIAGNOSIS — K51911 Ulcerative colitis, unspecified with rectal bleeding: Secondary | ICD-10-CM

## 2017-01-27 DIAGNOSIS — Z202 Contact with and (suspected) exposure to infections with a predominantly sexual mode of transmission: Secondary | ICD-10-CM

## 2017-01-27 DIAGNOSIS — A749 Chlamydial infection, unspecified: Secondary | ICD-10-CM

## 2017-01-27 DIAGNOSIS — A539 Syphilis, unspecified: Secondary | ICD-10-CM

## 2017-01-27 DIAGNOSIS — F4321 Adjustment disorder with depressed mood: Secondary | ICD-10-CM

## 2017-01-27 DIAGNOSIS — I10 Essential (primary) hypertension: Secondary | ICD-10-CM | POA: Insufficient documentation

## 2017-01-27 DIAGNOSIS — K625 Hemorrhage of anus and rectum: Secondary | ICD-10-CM

## 2017-01-27 DIAGNOSIS — A549 Gonococcal infection, unspecified: Secondary | ICD-10-CM

## 2017-01-27 MED ORDER — CEFTRIAXONE SODIUM 250 MG IJ SOLR
250.0000 mg | Freq: Once | INTRAMUSCULAR | Status: AC
  Administered 2017-01-27: 250 mg via INTRAMUSCULAR

## 2017-01-27 MED ORDER — BICTEGRAVIR-EMTRICITAB-TENOFOV 50-200-25 MG PO TABS: 1.0000 | ORAL_TABLET | Freq: Every day | ORAL | 5 refills | Status: DC

## 2017-01-27 MED ORDER — MIRTAZAPINE 15 MG PO TABS: 15.0000 mg | ORAL_TABLET | Freq: Every day | ORAL | 3 refills | Status: DC

## 2017-01-27 MED ORDER — AZITHROMYCIN 250 MG PO TABS
1000.0000 mg | ORAL_TABLET | Freq: Once | ORAL | Status: AC
  Administered 2017-01-27: 1000 mg via ORAL

## 2017-01-27 MED ORDER — PENICILLIN G BENZATHINE 1200000 UNIT/2ML IM SUSP
2.4000 10*6.[IU] | Freq: Once | INTRAMUSCULAR | Status: AC
  Administered 2017-01-27: 2.4 10*6.[IU] via INTRAMUSCULAR

## 2017-01-27 NOTE — Assessment & Plan Note (Signed)
I discussed with Larry Lutz treatment options/side effects, benefits of treatment and long-term outcomes. I discussed how HIV is transmitted and the process of untreated HIV including increased risk for opportunistic infections, cancer, dementia and renal failure. He was counseled on routine HIV care including medication adherence, blood monitoring, necessary vaccines and follow up visits. Counseled regarding safe sex practices including: condom use, partner disclosure, limiting partners and potential for PrEP.   I decided to start him on Biktarvy today. His ADAP is approved and ready to start on meds today. Counseled on side effects and proper adherence. Will have him return to see me in 4 weeks.

## 2017-01-27 NOTE — Assessment & Plan Note (Signed)
Will check CBC today to make sure he is not too anemic with his history. Need to get him plugged into primary care and with insurance for this condition.

## 2017-01-27 NOTE — Assessment & Plan Note (Signed)
Extremely tearful during visit. Apparently his brother died of HIV in 05-16-14 and he is facing some mortality with his diagnosis and need to go onto medications. Will start him on Mirtazapine as he is also having difficulty with eating. Introduced him to [place] today and he will attend follow up sessions with her.

## 2017-01-27 NOTE — Assessment & Plan Note (Signed)
BP elevated today, however a lot going on with visit. Will recheck at next visit to see if persistently elevated. Likely to resume lisinopril/hctz as he was previously on this.

## 2017-01-27 NOTE — Patient Instructions (Signed)
We are starting you on 2 new medications today.  Biktarvy is the pill for your HIV infection - this will need to be taken once a day around the same time.  - Common side effects for a short time frame usually include headaches, nausea and diarrhea - OK to take over the counter tylenol for headaches and imodium for diarrhea - Try taking with food if you are nauseated   Would like for you to come back in 4 weeks to recheck your labs and check in with you  Please reach out to [facility] our counselor.

## 2017-01-27 NOTE — Assessment & Plan Note (Addendum)
I wonder if he is having some nerve entrapment d/t medial epicondylitis with his repetitive work motions. I do not see anything on exam to explain pain at his [facility] from injection. XRAY of hand was negative in ER in September. Other thing to consider would be MRI of his arm to better evaluate tissue/vessels/nerves but limited regarding payment for now. Recommended to decrease gabapentin to 600 mg BID and start icing the back of his elbow after work at night. Would stay away from NSAIDs as he had recent GIB. Tylenol for pain.
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
## 2017-01-27 NOTE — Assessment & Plan Note (Addendum)
Would strongly favor empiric treatment today with his symptoms. RPR, O/A/U swabs obtained for GC/C and trich testing. Provided with 250 mg rocephin IM, bicillin 2.4 million units IM x 1, azithromycin 1g PO x 1. Offered condoms today.

## 2017-01-28 ENCOUNTER — Telehealth: Payer: Self-pay | Admitting: *Deleted

## 2017-01-28 LAB — CBC
HEMATOCRIT: 37.4 % — AB (ref 38.5–50.0)
HEMOGLOBIN: 11.8 g/dL — AB (ref 13.2–17.1)
MCH: 20.1 pg — AB (ref 27.0–33.0)
MCHC: 31.6 g/dL — ABNORMAL LOW (ref 32.0–36.0)
MCV: 63.7 fL — AB (ref 80.0–100.0)
MPV: 9.9 fL (ref 7.5–12.5)
Platelets: 387 10*3/uL (ref 140–400)
RBC: 5.87 10*6/uL — AB (ref 4.20–5.80)
RDW: 19.6 % — ABNORMAL HIGH (ref 11.0–15.0)
WBC: 8.4 10*3/uL (ref 3.8–10.8)

## 2017-01-28 LAB — RPR: RPR Ser Ql: NONREACTIVE

## 2017-01-28 NOTE — Telephone Encounter (Signed)
Patient called stating [facility] did not have the ADAP #. Called [facility] and # given.

## 2017-01-31 LAB — CYTOLOGY, (ORAL, ANAL, URETHRAL) ANCILLARY ONLY
CHLAMYDIA, DNA PROBE: NEGATIVE
Chlamydia: NEGATIVE
Chlamydia: NEGATIVE
NEISSERIA GONORRHEA: NEGATIVE
NEISSERIA GONORRHEA: NEGATIVE
Neisseria Gonorrhea: NEGATIVE
TRICH (WINDOWPATH): NEGATIVE

## 2017-01-31 LAB — URINE CYTOLOGY ANCILLARY ONLY
Chlamydia: NEGATIVE
Neisseria Gonorrhea: NEGATIVE

## 2017-02-10 ENCOUNTER — Ambulatory Visit: Payer: Self-pay

## 2017-02-17 ENCOUNTER — Ambulatory Visit: Payer: Self-pay

## 2017-02-24 ENCOUNTER — Ambulatory Visit: Payer: Self-pay | Admitting: Infectious Diseases

## 2017-02-25 ENCOUNTER — Encounter: Payer: Self-pay | Admitting: Infectious Diseases

## 2017-02-25 ENCOUNTER — Ambulatory Visit (INDEPENDENT_AMBULATORY_CARE_PROVIDER_SITE_OTHER): Payer: Self-pay | Admitting: Infectious Diseases

## 2017-02-25 VITALS — BP 134/88 | HR 73 | Temp 97.8°F | Wt 161.0 lb

## 2017-02-25 DIAGNOSIS — F4321 Adjustment disorder with depressed mood: Secondary | ICD-10-CM

## 2017-02-25 DIAGNOSIS — I10 Essential (primary) hypertension: Secondary | ICD-10-CM

## 2017-02-25 DIAGNOSIS — K51911 Ulcerative colitis, unspecified with rectal bleeding: Secondary | ICD-10-CM

## 2017-02-25 DIAGNOSIS — M542 Cervicalgia: Secondary | ICD-10-CM

## 2017-02-25 DIAGNOSIS — E7800 Pure hypercholesterolemia, unspecified: Secondary | ICD-10-CM

## 2017-02-25 DIAGNOSIS — B20 Human immunodeficiency virus [HIV] disease: Secondary | ICD-10-CM

## 2017-02-25 LAB — COMPLETE METABOLIC PANEL WITH GFR
AG RATIO: 1.3 (calc) (ref 1.0–2.5)
ALBUMIN MSPROF: 4.4 g/dL (ref 3.6–5.1)
ALKALINE PHOSPHATASE (APISO): 117 U/L — AB (ref 40–115)
ALT: 12 U/L (ref 9–46)
AST: 18 U/L (ref 10–40)
BILIRUBIN TOTAL: 0.6 mg/dL (ref 0.2–1.2)
BUN: 13 mg/dL (ref 7–25)
CHLORIDE: 103 mmol/L (ref 98–110)
CO2: 28 mmol/L (ref 20–32)
Calcium: 9.8 mg/dL (ref 8.6–10.3)
Creat: 1.08 mg/dL (ref 0.60–1.35)
GFR, Est African American: 108 mL/min/{1.73_m2} (ref >=60)
GFR, Est Non African American: 94 mL/min/{1.73_m2} (ref >=60)
GLOBULIN: 3.4 g/dL (ref 1.9–3.7)
Glucose, Bld: 85 mg/dL (ref 65–99)
Potassium: 4.5 mmol/L (ref 3.5–5.3)
SODIUM: 140 mmol/L (ref 135–146)
Total Protein: 7.8 g/dL (ref 6.1–8.1)

## 2017-02-25 LAB — CK: Total CK: 202 U/L — ABNORMAL HIGH (ref 44–196)

## 2017-02-25 MED ORDER — DOLUTEGRAVIR SODIUM 50 MG PO TABS: 50.0000 mg | ORAL_TABLET | Freq: Every day | ORAL | 5 refills | Status: DC

## 2017-02-25 MED ORDER — EMTRICITABINE-TENOFOVIR AF 200-25 MG PO TABS
1.0000 | ORAL_TABLET | Freq: Every day | ORAL | 5 refills | Status: DC
Start: 2017-02-25 — End: 2017-02-25

## 2017-02-25 MED ORDER — ABACAVIR-DOLUTEGRAVIR-LAMIVUD 600-50-300 MG PO TABS: 1.0000 | ORAL_TABLET | Freq: Every day | ORAL | 5 refills | Status: DC

## 2017-02-25 MED ORDER — PREDNISONE 20 MG PO TABS: 40.0000 mg | ORAL_TABLET | Freq: Every day | ORAL | 0 refills | Status: DC

## 2017-02-25 NOTE — Assessment & Plan Note (Signed)
Depression-induced vs muscle strain vs CK/lactic acidosis from [place]? Will check CMET and CK today. Eitherway changing him to new ART. Prednisone should help with inflammation. Advised conservative pain regimens with ice/heat and rest along with gentle passive stretching to loosen muscles safely. Tylenol for pain to avoid worsening of his bleeding a/w nsaids.

## 2017-02-25 NOTE — Patient Instructions (Addendum)
Stop Biktarvy and start taking Triumeq every day.   Start taking prednisone 2 tablets every day with food x 1 month   Will continue your ulcerative colitis medications when I get your records.   Continue your Remeron for now until we talk.   Will have you back in 1 month to see how you are doing.

## 2017-02-25 NOTE — Assessment & Plan Note (Signed)
Adequate today. Will continue to monitor.

## 2017-02-25 NOTE — Assessment & Plan Note (Signed)
Not happy with Remeron and requesting alternative. Will attempt to get records about previous regimens as he is reporting good effect from prior regimen he cannot recall. For now continue Remeron until we change to alternative. Would like to try trazodone QHS and escitalopram or citalopram to limit sexual side effects.

## 2017-02-25 NOTE — Assessment & Plan Note (Addendum)
Will change to Triumeq to help with side effect of diarrhea he is having with Biktarvy. If this does not work would consider Tivicay/Descovy. Will check VL and cmet today. Safe sex counseling provided. Condoms provided.

## 2017-02-25 NOTE — Assessment & Plan Note (Signed)
He continues to have bleeding with diarrhea. Previously was given prednisone in March to help with UC symptoms. Will refill this for him and request records from pervious provider in [place] to see what he was on for UC maintenance. He understands that he may require further evaluation for this condition by a specialist should his previous regimen not be effective. Also understands that outside from the prednisone the UC medication will not be covered by ADAP.

## 2017-02-25 NOTE — Progress Notes (Signed)
Larry Lutz  1991/02/02 [id number] Patient, No Pcp Per   Reason for Visit: Routine HIV Care   Brief Narrative: Larry Lutz is a 26 y.o. AA male with HIV infection. Originally diagnosed with HIV in 2017 in [place] (no records available). Never on medications in the past but had initially undetectable VL until recently without explanation. History of OIs: none Genotype: wild type 12/2016.  Patient Active Problem List   Diagnosis Date Noted  . Neck pain 02/25/2017  . Elevated cholesterol 02/25/2017  . Right hand pain 01/27/2017  . High blood pressure 01/27/2017  . Human immunodeficiency virus (HIV) disease ([place]) 01/05/2017  . Adjustment disorder with depressed mood   . HIV antibody positive ([place]) 06/02/2016  . Ulcerative colitis, acute ([place]) 06/01/2016  . Gastrointestinal hemorrhage 06/01/2016    HPI:  HIV =  Reports excellent adherence on Biktarvy however is very troubled by diarrhea side effect. Having associated rectal bleeding that has now worsened.    Depression = previously on treatment in the past. Recent ER visit for suicidality that was transient. He was cleared in the ER for this. Tearful often, feels very much overwhelmed with all that is going on, got out of a bad relationship and now with new daily medication requirement. He denies SI currently and feels he now is in a better situation here in [place] vs [place], [place].   Neck Pain = this is a new problem for him. Has progressively gotten worse and now having difficulty turning his head from side to side. Tension and tenderness to back of neck but also with generalized muscle aches. Uncertain if related to [facility] or not. No explanation regarding injury. Has been using ibuprofen over the counter with some effect. Denies any visual changes, headaches.   Review of Systems: Review of Systems  Constitutional: Negative for chills, fever, malaise/fatigue and weight loss.  HENT: Negative for sore throat.        No dental problems Neck  pain as above   Respiratory: Negative for cough and sputum production.   Cardiovascular: Negative for chest pain and leg swelling.  Gastrointestinal: Positive for blood in stool and diarrhea. Negative for abdominal pain and vomiting.  Genitourinary: Negative for dysuria and flank pain.  Musculoskeletal: Negative for joint pain, myalgias and neck pain.  Skin: Negative for rash.  Neurological: Negative for dizziness and headaches.  Psychiatric/Behavioral: Positive for depression. Negative for substance abuse and suicidal ideas. The patient is nervous/anxious and has insomnia.     Past Medical History:  Diagnosis Date  . Colitis   . HIV (human immunodeficiency virus infection) ([place])   . Hypertension    No Known Allergies   Outpatient Medications Prior to Visit  Medication Sig Dispense Refill  . mirtazapine (REMERON) 15 MG tablet Take 1 tablet (15 mg total) at bedtime by mouth. 30 tablet 3  . bictegravir-emtricitabine-tenofovir AF (BIKTARVY) 50-200-25 MG TABS tablet Take 1 tablet daily by mouth. Try to take at the same time each day with or without food. 30 tablet 5  . hydrOXYzine (ATARAX/VISTARIL) 25 MG tablet Take 1 tablet (25 mg total) by mouth every 6 (six) hours as needed for anxiety. (Patient not taking: Reported on 01/05/2017) 30 tablet 0  . traZODone (DESYREL) 50 MG tablet Take 1 tablet (50 mg total) by mouth at bedtime as needed for sleep. (Patient not taking: Reported on 01/05/2017) 30 tablet 0   No facility-administered medications prior to visit.     Social History   Tobacco Use  .  Smoking status: Never Smoker  . Smokeless tobacco: Never Used  Substance Use Topics  . Alcohol use: No  . Drug use: Yes    Types: Marijuana    Family History  Problem Relation Age of Onset  . Ulcerative colitis Mother    Male partners, infrequent condom use  Physical Exam and Objective Findings:  Vitals:   02/25/17 1104  BP: 134/88  Pulse: 73  Temp: 97.8 F (36.6 C)  Weight: 161  lb (73 kg)   Body mass index is 24.48 kg/m.  Physical Exam  Constitutional: He is oriented to person, place, and time and well-developed, well-nourished, and in no distress.  HENT:  Mouth/Throat: Oropharynx is clear and moist. No oral lesions. Normal dentition. No dental caries.  Eyes: No scleral icterus.  Neck: Trachea normal. Muscular tenderness present. No neck rigidity. Decreased range of motion present. No thyromegaly present.  Cardiovascular: Normal rate, regular rhythm and normal heart sounds.  Pulmonary/Chest: Effort normal and breath sounds normal.  Abdominal: Soft. He exhibits no distension. There is no tenderness.  Musculoskeletal:  Right arm - Brachial and radial reflexes intact. Neurovascular intact. ROM intact. Does have some medial epicondylar tenderness with point compression.   Lymphadenopathy:    He has no cervical adenopathy.  Neurological: He is alert and oriented to person, place, and time.  Skin: Skin is warm and dry. No rash noted.  Psychiatric: Mood and affect normal.  Vitals reviewed.  Lab Results Lab Results  Component Value Date   WBC 8.4 01/27/2017   HGB 11.8 (L) 01/27/2017   HCT 37.4 (L) 01/27/2017   MCV 63.7 (L) 01/27/2017   PLT 387 01/27/2017    Lab Results  Component Value Date   CREATININE 0.99 01/05/2017   BUN 11 01/05/2017   NA 137 01/05/2017   K 4.7 01/05/2017   CL 101 01/05/2017   CO2 30 01/05/2017    Lab Results  Component Value Date   ALT 18 01/05/2017   AST 26 01/05/2017   ALKPHOS 70 10/21/2016   BILITOT 0.7 01/05/2017    Lab Results  Component Value Date   CHOL 236 (H) 01/05/2017   HDL 45 01/05/2017   TRIG 138 01/05/2017   CHOLHDL 5.2 (H) 01/05/2017   HIV 1 RNA Quant  Date Value  01/05/2017 104,000 copies/mL (H)  01/05/2017 92,200 Copies/mL (H)  06/03/2016 20 copies/mL   CD4 T Cell Abs (/uL)  Date Value  01/05/2017 580  06/02/2016 760   Lab Results  Component Value Date   HAV Positive (A) 06/02/2016   Lab  Results  Component Value Date   HEPBSAG NON-REACTIVE 01/05/2017   HEPBSAB REACTIVE (A) 01/05/2017   No results found for: HCVAB Lab Results  Component Value Date   CHLAMYDIAWP Negative 01/27/2017   CHLAMYDIAWP Negative 01/27/2017   CHLAMYDIAWP Negative 01/27/2017   CHLAMYDIAWP Negative 01/27/2017   N Negative 01/27/2017   N Negative 01/27/2017   N Negative 01/27/2017   N Negative 01/27/2017   No results found for: GCPROBEAPT No results found for: QUANTGOLD No results found for: RPR    Problem List Items Addressed This Visit      Cardiovascular and Mediastinum   High blood pressure    Adequate today. Will continue to monitor.         Digestive   Ulcerative colitis, acute ([place])    He continues to have bleeding with diarrhea. Previously was given prednisone in March to help with UC symptoms. Will refill this for him and request  records from pervious provider in [place] to see what he was on for UC maintenance. He understands that he may require further evaluation for this condition by a specialist should his previous regimen not be effective. Also understands that outside from the prednisone the UC medication will not be covered by [facility].         Other   Adjustment disorder with depressed mood    Not happy with Remeron and requesting alternative. Will attempt to get records about previous regimens as he is reporting good effect from prior regimen he cannot recall. For now continue Remeron until we change to alternative. Would like to try trazodone QHS and escitalopram or citalopram to limit sexual side effects.       Elevated cholesterol    Will at some point need to consider addition of statin therapy. Continue to monitor for now.       Human immunodeficiency virus (HIV) disease ([place]) - Primary (Chronic)    Will change to Triumeq to help with side effect of diarrhea he is having with Biktarvy. If this does not work would consider Tivicay/Descovy. Will check VL and cmet today.  Safe sex counseling provided. Condoms provided.       Relevant Medications   abacavir-dolutegravir-lamiVUDine (TRIUMEQ) 600-50-300 MG tablet   Other Relevant Orders   HIV 1 RNA quant-no reflex-bld   COMPLETE METABOLIC PANEL WITH GFR   Neck pain    Depression-induced vs muscle strain vs CK/lactic acidosis from [place]? Will check CMET and CK today. Eitherway changing him to new ART. Prednisone should help with inflammation. Advised conservative pain regimens with ice/heat and rest along with gentle passive stretching to loosen muscles safely. Tylenol for pain to avoid worsening of his bleeding a/w nsaids.       Relevant Medications   predniSONE (DELTASONE) 20 MG tablet   Other Relevant Orders   CK     Meds ordered this encounter  Medications  . predniSONE (DELTASONE) 20 MG tablet    Sig: Take 2 tablets (40 mg total) by mouth daily with breakfast.    Dispense:  60 tablet    Refill:  0    Order Specific Question:   Supervising Provider    Answer:   HATCHER, JEFFREY C [[id number]]  . DISCONTD: dolutegravir (TIVICAY) 50 MG tablet    Sig: Take 1 tablet (50 mg total) by mouth daily.    Dispense:  30 tablet    Refill:  5    Order Specific Question:   Supervising Provider    Answer:   HATCHER, JEFFREY C [[id number]]  . DISCONTD: emtricitabine-tenofovir AF (DESCOVY) 200-25 MG tablet    Sig: Take 1 tablet by mouth daily.    Dispense:  30 tablet    Refill:  5    Order Specific Question:   Supervising Provider    Answer:   HATCHER, JEFFREY C [[id number]]  . abacavir-dolutegravir-lamiVUDine (TRIUMEQ) 600-50-300 MG tablet    Sig: Take 1 tablet by mouth daily. Try to take at the same time each day with or without food.    Dispense:  30 tablet    Refill:  5    Please disregard tivicay / genvoya    Order Specific Question:   Supervising Provider    Answer:   HATCHER, JEFFREY C [[id number]]    Janene Madeira, MSN, NP-C [facility] [place] Pager:  [phone]  02/25/2017  1:37 PM

## 2017-02-25 NOTE — Assessment & Plan Note (Signed)
Will at some point need to consider addition of statin therapy. Continue to monitor for now.

## 2017-03-01 LAB — HIV-1 RNA QUANT-NO REFLEX-BLD
HIV 1 RNA Quant: 20 copies/mL — ABNORMAL HIGH
HIV-1 RNA Quant, Log: 1.3 Log copies/mL — ABNORMAL HIGH

## 2017-03-02 ENCOUNTER — Telehealth: Payer: Self-pay | Admitting: *Deleted

## 2017-03-02 NOTE — Telephone Encounter (Signed)
I put his labs in the mail; but he actually did request that you give him a call, if you don't mind. Thanks, Kennyth Lose

## 2017-03-02 NOTE — Telephone Encounter (Signed)
Patient called for his lab results. He wanted to get them via my chart; but unfortunately he did not get the activation code when he was here and we can not mail that or give over the phone. I asked if he could pick up and he does not have transportation. He said he moved into an apartment and I updated his address. I verified the patient via social security #, date of birth and previous address. Ok to mail a copy of his labs? And is there anything that you want me to advise the patient?

## 2017-03-02 NOTE — Telephone Encounter (Signed)
I would anticipate he wants the results of his viral load. OK with me if you give him results over the phone. OK to mail a copy of his labs if he would prefer that.   I did check blood work to see if there was a possible side effect from [place] regarding muscle aches and I do not think it is due to [place] based on results.   Happy to call him this afternoon if you prefer me call.

## 2017-03-25 ENCOUNTER — Ambulatory Visit: Payer: Self-pay | Admitting: Infectious Diseases

## 2017-03-29 ENCOUNTER — Ambulatory Visit: Payer: Self-pay | Admitting: Internal Medicine

## 2017-05-11 ENCOUNTER — Encounter: Payer: Self-pay | Admitting: Family

## 2017-05-11 ENCOUNTER — Ambulatory Visit (INDEPENDENT_AMBULATORY_CARE_PROVIDER_SITE_OTHER): Payer: Self-pay | Admitting: Family

## 2017-05-11 ENCOUNTER — Encounter: Payer: Self-pay | Admitting: Behavioral Health

## 2017-05-11 VITALS — BP 155/99 | HR 75 | Temp 98.1°F | Ht 67.0 in | Wt 157.0 lb

## 2017-05-11 DIAGNOSIS — G5691 Unspecified mononeuropathy of right upper limb: Secondary | ICD-10-CM | POA: Insufficient documentation

## 2017-05-11 DIAGNOSIS — J0100 Acute maxillary sinusitis, unspecified: Secondary | ICD-10-CM

## 2017-05-11 DIAGNOSIS — B20 Human immunodeficiency virus [HIV] disease: Secondary | ICD-10-CM

## 2017-05-11 MED ORDER — PREDNISONE 5 MG (21) PO TBPK: ORAL_TABLET | ORAL | 0 refills | Status: DC

## 2017-05-11 NOTE — Assessment & Plan Note (Signed)
Continues have numbness along the thumb and second finger. Question possible traumatic neuropathy from described injection. States he has had workup done in [place]. Encouraged to obtain records to determine what has been done. Symptoms are generally refractory to gabapentin. Recommend continuing to monitor symptoms at this time pending obtaining records. This does not appear to have a cervical origin. May need further evaluation through neurology for possible nerve conduction test.

## 2017-05-11 NOTE — Patient Instructions (Signed)
Please continue to take your Triumeq.  I have sent a prescription for a short course of prednisone to help with your symptoms.   Try to obtain your records from [place] regarding testing of your hand as that would be the next step.

## 2017-05-11 NOTE — Assessment & Plan Note (Signed)
Symptoms and exam consistent with sinusitis most likely viral. Start prednisone taper. Continue over-the-counter medications as needed for symptom relief and supportive care. Follow-up if symptoms worsen or do not improve.
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
## 2017-05-11 NOTE — Assessment & Plan Note (Signed)
Reports missing 2 dosages since his previous appointment. Requesting refill check of his CD4 count and viral load. Encouraged medication compliance. Continue current dosage of Triumeq.

## 2017-05-11 NOTE — Assessment & Plan Note (Deleted)
Reports missing 2 dosages since his previous appointment. Requesting refill check of his CD4 count and viral load. Encouraged medication compliance. Continue current dosage of Triumeq.

## 2017-05-12 NOTE — Addendum Note (Signed)
Addended by: Dolan Amen D on: 05/12/2017 02:47 PM   Modules accepted: Orders

## 2017-05-13 LAB — HIV-1 RNA QUANT-NO REFLEX-BLD
HIV 1 RNA Quant: <20 copies/mL — AB
HIV-1 RNA Quant, Log: <1.3 Log copies/mL — AB

## 2017-05-13 LAB — T-HELPER CELL (CD4) - (RCID CLINIC ONLY)
CD4 T CELL HELPER: 28 % — AB (ref 33–55)
CD4 T Cell Abs: 940 /uL (ref 400–2700)

## 2017-05-17 ENCOUNTER — Telehealth: Payer: Self-pay | Admitting: Behavioral Health

## 2017-05-17 NOTE — Telephone Encounter (Signed)
Called patient, left voicemail from [place] and [employer] office to call back. Pricilla Riffle RN

## 2017-05-17 NOTE — Telephone Encounter (Signed)
-----   Message from [place], FNP sent at 05/13/2017  9:57 AM EST ----- Please inform patient that his CD4 count has increased to 940 which is great and his viral load is undetectable. Keep up the good work and follow up with Colletta Maryland as scheduled.

## 2017-05-19 ENCOUNTER — Ambulatory Visit: Payer: Self-pay | Admitting: Infectious Diseases

## 2017-06-16 ENCOUNTER — Ambulatory Visit: Payer: Self-pay | Admitting: Infectious Diseases

## 2017-06-23 ENCOUNTER — Ambulatory Visit: Payer: Self-pay | Admitting: Infectious Diseases

## 2017-08-17 ENCOUNTER — Encounter: Payer: Self-pay | Admitting: Infectious Diseases

## 2017-09-02 NOTE — Telephone Encounter (Signed)
Received Unread message notice. RN called patient to offer an appointment with Colletta Maryland that would work with his work schedule. He is off for the next few days, will come in Monday at 11:15. He will need labs same day. Landis Gandy, RN

## 2017-09-05 ENCOUNTER — Encounter: Payer: Self-pay | Admitting: *Deleted

## 2017-09-05 ENCOUNTER — Ambulatory Visit: Payer: Self-pay | Admitting: Infectious Diseases

## 2017-09-19 ENCOUNTER — Encounter: Payer: Self-pay | Admitting: Infectious Diseases

## 2017-09-22 ENCOUNTER — Telehealth: Payer: Self-pay | Admitting: *Deleted

## 2017-09-22 NOTE — Telephone Encounter (Signed)
RN called patient again (previous calls were disconnected/in a bad service area).  Patient would like to come in Friday morning if possible. He needs labs, financial counseling, NP visit. Landis Gandy, RN

## 2017-09-22 NOTE — Telephone Encounter (Signed)
-----   Message from [place] sent at 09/19/2017  2:26 PM EDT ----- Patient needs a call back from a nurse as he is having some medical issues he wants to discuss

## 2017-09-23 ENCOUNTER — Ambulatory Visit: Payer: Self-pay | Admitting: Family

## 2017-09-23 ENCOUNTER — Ambulatory Visit: Payer: Self-pay

## 2017-10-13 ENCOUNTER — Ambulatory Visit: Payer: Self-pay

## 2017-10-13 ENCOUNTER — Ambulatory Visit (INDEPENDENT_AMBULATORY_CARE_PROVIDER_SITE_OTHER): Payer: Federal, State, Local not specified - Other | Admitting: Licensed Clinical Social Worker

## 2017-10-13 DIAGNOSIS — F331 Major depressive disorder, recurrent, moderate: Secondary | ICD-10-CM | POA: Diagnosis not present

## 2017-10-13 NOTE — Progress Notes (Unsigned)
Patient came to see [name] for [facility]

## 2017-10-14 NOTE — BH Specialist Note (Signed)
Integrated Behavioral Health Initial Visit  MRN: [id number] Name: Larry Lutz  Number of [place] Clinician visits:: 1/6 Session Start time: 2:23  Session End time:  2:54pm Total time: 30 minutes  Type of Service: [place] Interpretor:No. Interpretor Name and Language: n/a    Warm Hand Off Completed.       SUBJECTIVE: Larry Lutz is a 27 y.o. male accompanied by self Patient was referred by Harvin Hazel for depression. Patient reports the following symptoms/concerns: bad mood all the time, angers easily, doesn't enjoy anything, can't get motivated, mood swings, trouble concentrating Duration of problem: 5 months; Severity of problem: moderate  OBJECTIVE: Mood: Irritable and Affect: Labile Risk of harm to self or others: No plan to harm self or others  LIFE CONTEXT: Patient reports that he was incarcerated for 2 years and got out in January. He states that his life has just gone downhill since he was arrested, and that he is very depressed about it. Until recently patient has been staying with whomever he can, but he has now found an apartment (will move in on 8/8) and "should be starting a job soon."  Patient reports that the last time he remembers being happy was before his arrest, and that he was living in [place] at the time. He asks about getting medications for mental health, and states that he was last prescribed anti-depressants in [place]. He indicates that a psychiatrist there talked to him about bipolar disorder, but did not prescribe a mood stabilizer, only Zoloft. Patient did not like the effects of the Zoloft and stopped taking it. He said it made him too sleepy. Patient also states that he took a friend's Concerta a few times and that made him very focused and sexual, so he thinks that he has ADHD and would like medications for that as well.   GOALS ADDRESSED: Patient will: 1. Reduce symptoms of:  depression  INTERVENTIONS: Interventions utilized: Motivational Interviewing and Supportive Counseling   ASSESSMENT: Patient currently experiencing depressed mood, irritability, rapid mood changes, anhedonia, decreased motivation, no appetite, hypersomnia, difficulty with focus and concentration, tangential speech. His symptoms are most consistent with Major Depressive Disorder at this time. Counselor will continue to assess for Bipolar Disorder, but at this time there are no evident markers for mania. Patient describes being "happy one minute and angry the next" but does not endorse other symptoms of mania at this time. 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   Counselor explored with patient his experiences before and after incarceration. Patient pointed out that that everything since his arrest and incarceration has been increasingly negative. He talks about his time in [place] before the arrest very differently, describing himself as "happy go lucky" and never having problems or getting into trouble. Counselor guided patient to identify what he would like to see be different in his life. Counselor and patient explored ways for effecting these changes. Patient identifies the need for someone to help him process and work through decisions. Counselor explained available counseling services at [facility], and patient asked to schedule an appointment.    Patient may benefit from ongoing assessment and CBT.  PLAN: 1. Follow up with behavioral health clinician on : 10/17/17  Lillie Fragmin, LCSW

## 2017-10-17 ENCOUNTER — Ambulatory Visit (INDEPENDENT_AMBULATORY_CARE_PROVIDER_SITE_OTHER): Payer: Self-pay | Admitting: Licensed Clinical Social Worker

## 2017-10-17 ENCOUNTER — Ambulatory Visit (INDEPENDENT_AMBULATORY_CARE_PROVIDER_SITE_OTHER): Payer: Self-pay | Admitting: Pharmacist

## 2017-10-17 DIAGNOSIS — F331 Major depressive disorder, recurrent, moderate: Secondary | ICD-10-CM

## 2017-10-17 DIAGNOSIS — Z7189 Other specified counseling: Secondary | ICD-10-CM

## 2017-10-17 DIAGNOSIS — B20 Human immunodeficiency virus [HIV] disease: Secondary | ICD-10-CM

## 2017-10-17 MED ORDER — EMTRICITAB-RILPIVIR-TENOFOV AF 200-25-25 MG PO TABS: 1.0000 | ORAL_TABLET | Freq: Every day | ORAL | 2 refills | Status: DC

## 2017-10-17 NOTE — Progress Notes (Signed)
HPI: Larry Lutz is a 27 y.o. male who presents to the [facility] for HIV follow-up.  He has no showed Colletta [place] several times in the past 6 months.  Patient Active Problem List   Diagnosis Date Noted  . Neuropathy of right hand 05/11/2017  . Acute non-recurrent maxillary sinusitis 05/11/2017  . Neck pain 02/25/2017  . Elevated cholesterol 02/25/2017  . Right hand pain 01/27/2017  . High blood pressure 01/27/2017  . Human immunodeficiency virus (HIV) disease ([place]) 01/05/2017  . Adjustment disorder with depressed mood   . HIV antibody positive ([place]) 06/02/2016  . Ulcerative colitis, acute ([place]) 06/01/2016  . Gastrointestinal hemorrhage 06/01/2016    Patient's Medications  New Prescriptions   EMTRICITABINE-RILPIVIR-TENOFOVIR AF (ODEFSEY) 200-25-25 MG TABS TABLET    Take 1 tablet by mouth daily with lunch.  Previous Medications   ABACAVIR-DOLUTEGRAVIR-LAMIVUDINE (TRIUMEQ) 600-50-300 MG TABLET    Take 1 tablet by mouth daily. Try to take at the same time each day with or without food.   HYDROXYZINE (ATARAX/VISTARIL) 25 MG TABLET    Take 1 tablet (25 mg total) by mouth every 6 (six) hours as needed for anxiety.   MIRTAZAPINE (REMERON) 15 MG TABLET    Take 1 tablet (15 mg total) at bedtime by mouth.   PREDNISONE (STERAPRED UNI-PAK 21 TAB) 5 MG (21) TBPK TABLET    Take per package instructions.   TRAZODONE (DESYREL) 50 MG TABLET    Take 1 tablet (50 mg total) by mouth at bedtime as needed for sleep.  Modified Medications   No medications on file  Discontinued Medications   No medications on file    Allergies: No Known Allergies  Past Medical History: Past Medical History:  Diagnosis Date  . Colitis   . HIV (human immunodeficiency virus infection) ([place])   . Hypertension     Social History: Social History   Socioeconomic History  . Marital status: Single    Spouse name: Not on file  . Number of children: Not on file  . Years of education: Not on file  . Highest  education level: Not on file  Occupational History  . Not on file  Social Needs  . Financial resource strain: Not on file  . Food insecurity:    Worry: Not on file    Inability: Not on file  . Transportation needs:    Medical: Not on file    Non-medical: Not on file  Tobacco Use  . Smoking status: Never Smoker  . Smokeless tobacco: Never Used  Substance and Sexual Activity  . Alcohol use: No  . Drug use: Yes    Types: Marijuana, MDMA (Ecstacy)  . Sexual activity: Not on file    Comment: declined comdoms  Lifestyle  . Physical activity:    Days per week: Not on file    Minutes per session: Not on file  . Stress: Not on file  Relationships  . Social connections:    Talks on phone: Not on file    Gets together: Not on file    Attends religious service: Not on file    Active member of club or organization: Not on file    Attends meetings of clubs or organizations: Not on file    Relationship status: Not on file  Other Topics Concern  . Not on file  Social History Narrative  . Not on file    Labs: Lab Results  Component Value Date   HIV1RNAQUANT <20 DETECTED (A) 05/11/2017  HIV1RNAQUANT 20 (H) 02/25/2017   HIV1RNAQUANT 104,000 (H) 01/05/2017   HIV1RNAQUANT 92,200 (H) 01/05/2017   CD4TABS 940 05/12/2017   CD4TABS 580 01/05/2017   CD4TABS 760 06/02/2016    RPR and STI Lab Results  Component Value Date   LABRPR NON-REACTIVE 01/27/2017   LABRPR NON-REACTIVE 01/05/2017   LABRPR Non Reactive 06/02/2016    STI Results GC CT  01/27/2017 Negative Negative  01/27/2017 Negative Negative  01/27/2017 Negative Negative  01/27/2017 Negative Negative  01/05/2017 Negative Negative    Hepatitis B Lab Results  Component Value Date   HEPBSAB REACTIVE (A) 01/05/2017   HEPBSAG NON-REACTIVE 01/05/2017   HEPBCAB NON-REACTIVE 01/05/2017   Hepatitis C No results found for: HEPCAB, HCVRNAPCRQN Hepatitis A Lab Results  Component Value Date   HAV REACTIVE (A) 01/05/2017     Lipids: Lab Results  Component Value Date   CHOL 236 (H) 01/05/2017   TRIG 138 01/05/2017   HDL 45 01/05/2017   CHOLHDL 5.2 (H) 01/05/2017   LDLCALC 163 (H) 01/05/2017    Current HIV Regimen: Biktarvy >> Triumeq  Assessment: Larry Lutz is here today after no showing Colletta [place] several times over the last 6 months.  He saw Dr. Linus Salmons back in October 2018 and was deferred treatment as it was thought that he was an elite controller.  He then saw Colletta [place] in November 2018 and was started on [place] as his HIV viral load came back >100,000.  He had diarrhea on Biktarvy and was switched to Triumeq.  He comes in today and has not been seen for his HIV since December 2018.  He did see Marya Amsler in February for an acute office visit when he had cold/flu like symptoms.   He has been off of Triumeq for at least a month.  He tells me that he has been out of town for multiple funerals in his family and had no medications when he was in [place]. He also states that he doesn't like Triumeq as it makes him sweat. He states he has night sweats every once in awhile.  No headaches, chest pain, SOB, or neck pain.  He is complaining of several social issues today including transportation, issues with his partner and roommate, and issues with finding a job. He is meeting with Rollene Fare after our visit.  I discussed different options for him today including going back to [facility] and trying it again as the diarrhea usually subsides. I also discussed Symtuza and Odefsey but of course discussed the caveats with both of those including requirement of meals and drug interactions. He would like to try [facility].  I emphasized that he had to take this with a full meal and he stated that it would be good for him because it will make him eat as he hasn't had time to eat lately or much of an appetite.  I told him if he starts taking it without food that it could cause it to not work and he would need to be switched to something else.  He  stated that if he didn't like that then he would try Biktarvy again.  I spent a significant amount of time explaining resistance and how a one pill once daily option for him will be no more if he continues down this road.  He is having trouble finding a job as he was previously incarcerated. He does odd jobs for money.  He renewed his HMAP with [name] last week.  I will check labs today and have him come back  and see me in 4-6 weeks.  I will then schedule him with Dr. Linus Salmons once we get him more stable.   Plan: - Stop Triumeq - Start Odefsey PO once daily with a full meal - HIV VL with reflex, CD4, CBC, CMET, urine cytology for STD screening and RPR - F/u with me 9/16 at 145pm  Dane Kopke L. Willim Turnage, PharmD, [address], [place] for Infectious Disease 10/17/2017, 2:43 PM

## 2017-10-18 LAB — CBC WITH DIFFERENTIAL/PLATELET
Basophils Absolute: 63 cells/uL (ref 0–200)
Basophils Relative: 0.8 %
Eosinophils Absolute: 269 cells/uL (ref 15–500)
Eosinophils Relative: 3.4 %
HEMATOCRIT: 44.8 % (ref 38.5–50.0)
HEMOGLOBIN: 14.7 g/dL (ref 13.2–17.1)
LYMPHS ABS: 2583 {cells}/uL (ref 850–3900)
MCH: 22.9 pg — ABNORMAL LOW (ref 27.0–33.0)
MCHC: 32.8 g/dL (ref 32.0–36.0)
MCV: 69.7 fL — ABNORMAL LOW (ref 80.0–100.0)
MPV: 10 fL (ref 7.5–12.5)
Monocytes Relative: 8.3 %
Neutro Abs: 4329 cells/uL (ref 1500–7800)
Neutrophils Relative %: 54.8 %
Platelets: 411 10*3/uL — ABNORMAL HIGH (ref 140–400)
RBC: 6.43 10*6/uL — AB (ref 4.20–5.80)
RDW: 15.9 % — ABNORMAL HIGH (ref 11.0–15.0)
Total Lymphocyte: 32.7 %
WBC: 7.9 10*3/uL (ref 3.8–10.8)
WBCMIX: 656 {cells}/uL (ref 200–950)

## 2017-10-18 LAB — T-HELPER CELL (CD4) - (RCID CLINIC ONLY)
CD4 % Helper T Cell: 35 % (ref 33–55)
CD4 T Cell Abs: 970 /uL (ref 400–2700)

## 2017-10-18 LAB — COMPREHENSIVE METABOLIC PANEL
AG Ratio: 1.5 (calc) (ref 1.0–2.5)
ALKALINE PHOSPHATASE (APISO): 118 U/L — AB (ref 40–115)
ALT: 17 U/L (ref 9–46)
AST: 24 U/L (ref 10–40)
Albumin: 4.9 g/dL (ref 3.6–5.1)
BILIRUBIN TOTAL: 1.1 mg/dL (ref 0.2–1.2)
BUN: 14 mg/dL (ref 7–25)
CALCIUM: 10.2 mg/dL (ref 8.6–10.3)
CO2: 25 mmol/L (ref 20–32)
Chloride: 100 mmol/L (ref 98–110)
Creat: 1.26 mg/dL (ref 0.60–1.35)
Globulin: 3.3 g/dL (calc) (ref 1.9–3.7)
Glucose, Bld: 71 mg/dL (ref 65–99)
Potassium: 3.7 mmol/L (ref 3.5–5.3)
Sodium: 140 mmol/L (ref 135–146)
Total Protein: 8.2 g/dL — ABNORMAL HIGH (ref 6.1–8.1)

## 2017-10-18 LAB — RPR: RPR Ser Ql: NONREACTIVE

## 2017-10-18 LAB — URINE CYTOLOGY ANCILLARY ONLY
Chlamydia: POSITIVE — AB
NEISSERIA GONORRHEA: NEGATIVE

## 2017-10-18 NOTE — BH Specialist Note (Signed)
Integrated Behavioral Health Follow Up Visit  MRN: [id number] Name: Collins Kerby  Number of [place] Clinician visits: 2/6 Session Start time:  2:18pm Session End time: 2:39pm Total time: 20 minutes  Type of Service: [place] Interpretor:No. Interpretor Name and Language: n/a SUBJECTIVE: Larry Lutz is a 27 y.o. male accompanied by self Patient reports the following symptoms/concerns: irritability, anxious thoughts, depressed mood, wanting to sleep all day, unstable relationships, lack of motivation  OBJECTIVE: Mood: Anxious and Affect: Labile Risk of harm to self or others: No plan to harm self or others  LIFE CONTEXT: Patient reports he has decided to make better choices and expects to see his life improve, though he cannot identify the different choices he is making, other than attending his appointments again. He indicates that he is no longer pursuing reconciliation with former partner and that he hopes to be moving into his new place in a few days.   GOALS ADDRESSED: Patient will: 1.  Reduce symptoms of: mood instability   INTERVENTIONS: Interventions utilized:  Brief CBT and Supportive Counseling  ASSESSMENT: Patient currently experiencing mood instability, irritability, anxiety, hypersomnia. Counselor commended patient on making better choices and guided him to discuss what some of these are. Patient initially struggled to identify anything other than coming back into medical care and keeping appointments. He was very focused on wanting medications for anxiety, but did not actually describe anxiety symptoms when asked about them. Counselor and patient discussed the changes patient wants to see for himself. Patient identifies that he needs to make changes in relationships and "hang around more positive people". Counselor guided him to identify what that means for him. Patient struggles to identify what makes a person  "positive" or "supportive", and focuses more on how the person looks or what they can do for him. Counselor explored with patient ways that friends and associates can support him or be positive forces in his life. Counselor encouraged patient to identify what is truly best for him and would make a positive impact in his life. In next session, counselor and patient will explore ways to incorporate these things in his life, and to set up positive relationships with these people.   Patient may benefit from ongoing CBT .  PLAN: 1. Set appointment with front desk staff at same time as medical follow-up.  Lillie Fragmin, LCSW

## 2017-10-19 ENCOUNTER — Other Ambulatory Visit: Payer: Self-pay | Admitting: Pharmacist

## 2017-10-19 ENCOUNTER — Encounter: Payer: Self-pay | Admitting: Infectious Diseases

## 2017-10-19 ENCOUNTER — Telehealth: Payer: Self-pay | Admitting: Pharmacist

## 2017-10-19 DIAGNOSIS — A749 Chlamydial infection, unspecified: Secondary | ICD-10-CM

## 2017-10-19 MED ORDER — AZITHROMYCIN 500 MG PO TABS: 1000.0000 mg | ORAL_TABLET | Freq: Once | ORAL | 0 refills | Status: AC

## 2017-10-19 MED ORDER — AZITHROMYCIN 500 MG PO TABS: 1000.0000 mg | ORAL_TABLET | Freq: Once | ORAL | 0 refills | Status: DC

## 2017-10-19 NOTE — Telephone Encounter (Signed)
Patient's urine STD screening came back positive for chlamydia.  Called patient to notify. I will send in azithromycin 1 gm PO x 1 to [facility] per his request.  Told him to tell all of his partners in the last month to get tested and told him to abstain from sex for 7-10 days.   Patient also stated that he had some burning in his penis/scrotum.  I told him it could be due to the chlamydia. Told him to take the azithromycin and call us back in ~1 week if it does not resolve.

## 2017-10-19 NOTE — Telephone Encounter (Signed)
Larry Lutz called me back and state that he thought he had chlamydia in his throat too.  I told him to make sure that he took the azithromycin and that the treatment would be the same no matter where he was infected.  Will retest him when he comes back next month per his requst.

## 2017-10-22 LAB — HIV RNA, RTPCR W/R GT (RTI, PI,INT)
HIV 1 RNA Quant: <20 copies/mL
HIV-1 RNA Quant, Log: <1.3 Log copies/mL

## 2017-11-09 ENCOUNTER — Telehealth: Payer: Self-pay | Admitting: Behavioral Health

## 2017-11-09 NOTE — Telephone Encounter (Signed)
Mr. Larry Lutz called in today stating he has a rash all over his body that itches.  He states it has prevented him from going to work.  He states it has been present for approximately 3 day.  He reports taking Benadryl and using hydrocortisone cream with no relief.  Spoke with Cassie Pharmacist and she suggested an office visit with provider to r/o STD or something else.  Mr Larry Lutz has an appointment schedule for this Friday.  He was advised to go to the ER if he he has increasing symptoms or difficulty breathing.  PAtient verbalized understanding. Pricilla Riffle RN

## 2017-11-10 DIAGNOSIS — Z202 Contact with and (suspected) exposure to infections with a predominantly sexual mode of transmission: Secondary | ICD-10-CM | POA: Insufficient documentation

## 2017-11-11 ENCOUNTER — Ambulatory Visit: Payer: Self-pay | Admitting: Infectious Diseases

## 2017-11-11 NOTE — Telephone Encounter (Signed)
Spoke with patient regarding rash "all over". Patient missed appointment to address rash today. Patient denies any swelling of the mouth, only complains of rash that is itching, swollen lymph nodes, and hurts to swallow. Advised patient to send a picture of rash via Mychart for provider to see and if symptoms progressed advised him to go to Urgent care/ER. Patient verbalized understanding. Larry Mallis S.

## 2017-11-28 ENCOUNTER — Ambulatory Visit (INDEPENDENT_AMBULATORY_CARE_PROVIDER_SITE_OTHER): Payer: Self-pay | Admitting: Pharmacist

## 2017-11-28 DIAGNOSIS — B20 Human immunodeficiency virus [HIV] disease: Secondary | ICD-10-CM

## 2017-11-28 DIAGNOSIS — Z23 Encounter for immunization: Secondary | ICD-10-CM

## 2017-11-28 MED ORDER — BICTEGRAVIR-EMTRICITAB-TENOFOV 50-200-25 MG PO TABS: 1.0000 | ORAL_TABLET | Freq: Every day | ORAL | 5 refills | Status: DC

## 2017-11-28 NOTE — Progress Notes (Signed)
HPI: Larry Lutz is a 27 y.o. male who presents to the [facility] clinic for HIV follow-up.  Patient Active Problem List   Diagnosis Date Noted  . Rash and nonspecific skin eruption 11/10/2017  . Neuropathy of right hand 05/11/2017  . Elevated cholesterol 02/25/2017  . Right hand pain 01/27/2017  . High blood pressure 01/27/2017  . Human immunodeficiency virus (HIV) disease ([place]) 01/05/2017  . Adjustment disorder with depressed mood   . HIV antibody positive ([place]) 06/02/2016  . Ulcerative colitis, acute ([place]) 06/01/2016  . Gastrointestinal hemorrhage 06/01/2016    Patient's Medications  New Prescriptions   BICTEGRAVIR-EMTRICITABINE-TENOFOVIR AF (BIKTARVY) 50-200-25 MG TABS TABLET    Take 1 tablet by mouth daily.  Previous Medications   HYDROXYZINE (ATARAX/VISTARIL) 25 MG TABLET    Take 1 tablet (25 mg total) by mouth every 6 (six) hours as needed for anxiety.   MIRTAZAPINE (REMERON) 15 MG TABLET    Take 1 tablet (15 mg total) at bedtime by mouth.   PREDNISONE (STERAPRED UNI-PAK 21 TAB) 5 MG (21) TBPK TABLET    Take per package instructions.   TRAZODONE (DESYREL) 50 MG TABLET    Take 1 tablet (50 mg total) by mouth at bedtime as needed for sleep.  Modified Medications   No medications on file  Discontinued Medications   EMTRICITABINE-RILPIVIR-TENOFOVIR AF (ODEFSEY) 200-25-25 MG TABS TABLET    Take 1 tablet by mouth daily with lunch.    Allergies: No Known Allergies  Past Medical History: Past Medical History:  Diagnosis Date  . Colitis   . HIV (human immunodeficiency virus infection) ([place])   . Hypertension     Social History: Social History   Socioeconomic History  . Marital status: Single    Spouse name: Not on file  . Number of children: Not on file  . Years of education: Not on file  . Highest education level: Not on file  Occupational History  . Not on file  Social Needs  . Financial resource strain: Not on file  . Food insecurity:    Worry: Not on file     Inability: Not on file  . Transportation needs:    Medical: Not on file    Non-medical: Not on file  Tobacco Use  . Smoking status: Never Smoker  . Smokeless tobacco: Never Used  Substance and Sexual Activity  . Alcohol use: No  . Drug use: Yes    Types: Marijuana, MDMA (Ecstacy)  . Sexual activity: Not on file    Comment: declined comdoms  Lifestyle  . Physical activity:    Days per week: Not on file    Minutes per session: Not on file  . Stress: Not on file  Relationships  . Social connections:    Talks on phone: Not on file    Gets together: Not on file    Attends religious service: Not on file    Active member of club or organization: Not on file    Attends meetings of clubs or organizations: Not on file    Relationship status: Not on file  Other Topics Concern  . Not on file  Social History Narrative  . Not on file    Labs: Lab Results  Component Value Date   HIV1RNAQUANT <20 DETECTED 10/17/2017   HIV1RNAQUANT <20 DETECTED (A) 05/11/2017   HIV1RNAQUANT 20 (H) 02/25/2017   CD4TABS 970 10/17/2017   CD4TABS 940 05/12/2017   CD4TABS 580 01/05/2017    RPR and STI Lab Results  Component Value Date   LABRPR NON-REACTIVE 10/17/2017   LABRPR NON-REACTIVE 01/27/2017   LABRPR NON-REACTIVE 01/05/2017   LABRPR Non Reactive 06/02/2016    STI Results GC CT  10/17/2017 Negative **POSITIVE**(A)  01/27/2017 Negative Negative  01/27/2017 Negative Negative  01/27/2017 Negative Negative  01/27/2017 Negative Negative  01/05/2017 Negative Negative    Hepatitis B Lab Results  Component Value Date   HEPBSAB REACTIVE (A) 01/05/2017   HEPBSAG NON-REACTIVE 01/05/2017   HEPBCAB NON-REACTIVE 01/05/2017   Hepatitis C No results found for: HEPCAB, HCVRNAPCRQN Hepatitis A Lab Results  Component Value Date   HAV REACTIVE (A) 01/05/2017   Lipids: Lab Results  Component Value Date   CHOL 236 (H) 01/05/2017   TRIG 138 01/05/2017   HDL 45 01/05/2017   CHOLHDL 5.2 (H)  01/05/2017   LDLCALC 163 (H) 01/05/2017    Current HIV Regimen: Larry Lutz, Triumeq  Assessment: Larry Lutz comes in today to follow-up for his HIV infection.  I recently saw him back in August when he had no showed [place] several times.  He has had some trouble in the past tolerating his ART regimens. He was on Biktarvy at first last fall but developed diarrhea and was switched to Triumeq.  He stopped taking Triumeq this summer due to issues with increased sweating that he attributed to the medication.  I then switched him to [facility] back in August but cautioned him that he had to take it with food.  He comes in today and tells me that he stopped taking Odefsey about a week and a half ago because he wasn't eating when he would take it.  He had some Triumeq left over, so he started taking that Lutz.  He has only missed one day (yesterday) since he ran out of Triumeq.  He wishes to go back to [place].  I told him that he really needed to pick a medication and stay on it and that switching back and forth is going to get him into trouble eventually. I asked him which he would prefer to take, and he chose Biktarvy.  He really wanted to try [place] for some reason, but I will not start that as it has rilpivirine in it and also needs to be taken with food like Odefsey.  It also would need to be started in someone who was stable on medications for at least 6 months, which he is not. He is agreeable to [employer]. I told him that he needed to stay on this and power through any initial side effects.  He did complain of a rash on Odefsey and was supposed to see Larry Lutz a few weeks ago but no showed the appointment.  The rash has resolved now and is no longer an issue.  I helped sign him up for the dental clinic and told him to reach out to Larry Lutz for housing assistance. Larry Lull, RN, also sat down with him and had a long talk about taking care of himself and staying on medications consistently. He would prefer not to see  Larry Lutz, so I scheduled him with Larry Lutz for depression management. He will see her next Thursday. He requested to see Larry Lutz Lutz, so I reluctantly scheduled him with her but told him that if he no shows her Lutz, she will not see him anymore.   He also states that he picked up and took the azithromycin that I sent in for him for his chlamydia.  I gave him a flu shot today.  Plan: - Restart  Biktarvy PO once daily - Send to [facility] on [address] and pick up today - F/u shot today - F/u with Larry Lutz 9/26 at 11am - F/u with Larry Lutz 11/13 at 145pm  Cana Mignano L. Jaziah Goeller, PharmD, [place], [place] for Infectious Disease 11/28/2017, 3:42 PM

## 2017-12-08 ENCOUNTER — Ambulatory Visit: Payer: Self-pay

## 2018-01-18 ENCOUNTER — Telehealth: Payer: Self-pay | Admitting: Behavioral Health

## 2018-01-18 NOTE — Telephone Encounter (Signed)
Patient called stating he thinks he was exposed to an STD.  Patient states that a recent partner may have Syphilis.  Patient states, "I'm burning." Advised patient to go to the health department to be tested and treated.  Gave him the phone number to the STD clinic at the health department. Pricilla Riffle RN

## 2018-01-25 ENCOUNTER — Ambulatory Visit: Payer: Self-pay | Admitting: Infectious Diseases

## 2018-02-22 ENCOUNTER — Other Ambulatory Visit: Payer: Self-pay | Admitting: Infectious Diseases

## 2018-02-22 ENCOUNTER — Encounter: Payer: Self-pay | Admitting: Infectious Diseases

## 2018-02-22 ENCOUNTER — Ambulatory Visit (INDEPENDENT_AMBULATORY_CARE_PROVIDER_SITE_OTHER): Payer: Self-pay | Admitting: Infectious Diseases

## 2018-02-22 VITALS — BP 164/112 | HR 81 | Temp 98.0°F | Ht 66.0 in | Wt 143.0 lb

## 2018-02-22 DIAGNOSIS — F329 Major depressive disorder, single episode, unspecified: Secondary | ICD-10-CM

## 2018-02-22 DIAGNOSIS — Z202 Contact with and (suspected) exposure to infections with a predominantly sexual mode of transmission: Secondary | ICD-10-CM

## 2018-02-22 DIAGNOSIS — Z7252 High risk homosexual behavior: Secondary | ICD-10-CM

## 2018-02-22 DIAGNOSIS — B20 Human immunodeficiency virus [HIV] disease: Secondary | ICD-10-CM

## 2018-02-22 DIAGNOSIS — F418 Other specified anxiety disorders: Secondary | ICD-10-CM

## 2018-02-22 DIAGNOSIS — F32A Depression, unspecified: Secondary | ICD-10-CM

## 2018-02-22 DIAGNOSIS — K51911 Ulcerative colitis, unspecified with rectal bleeding: Secondary | ICD-10-CM

## 2018-02-22 DIAGNOSIS — Z113 Encounter for screening for infections with a predominantly sexual mode of transmission: Secondary | ICD-10-CM

## 2018-02-22 MED ORDER — ONDANSETRON 4 MG PO TBDP: 4.0000 mg | ORAL_TABLET | Freq: Three times a day (TID) | ORAL | 0 refills | Status: DC | PRN

## 2018-02-22 MED ORDER — AZITHROMYCIN 250 MG PO TABS
1000.0000 mg | ORAL_TABLET | Freq: Once | ORAL | Status: AC
  Administered 2018-02-22: 1000 mg via ORAL

## 2018-02-22 MED ORDER — PREDNISONE 20 MG PO TABS: 40.0000 mg | ORAL_TABLET | Freq: Every day | ORAL | 0 refills | Status: DC

## 2018-02-22 MED ORDER — PENICILLIN G BENZATHINE 1200000 UNIT/2ML IM SUSP
1.2000 10*6.[IU] | Freq: Once | INTRAMUSCULAR | Status: AC
  Administered 2018-02-22: 1.2 10*6.[IU] via INTRAMUSCULAR

## 2018-02-22 MED ORDER — CEFTRIAXONE SODIUM 250 MG IJ SOLR
250.0000 mg | Freq: Once | INTRAMUSCULAR | Status: AC
  Administered 2018-02-22: 250 mg via INTRAMUSCULAR

## 2018-02-22 MED ORDER — ESCITALOPRAM OXALATE 10 MG PO TABS: ORAL_TABLET | ORAL | 0 refills | Status: DC

## 2018-02-22 NOTE — Patient Instructions (Signed)
I sent in some dissolvable antinausea medication - take this under the tongue 30 minutes before your biktarvy to help keep this down.   Continue biktarvy every day around the same time  Will give you 2 weeks of prednisone - please take 2 prednisone tablets every day with breakfast.   Will send in a referral for the GI team to see you and help with your symptoms.   Please make an appointment with [name] and me in January for follow up.   Please make an appointment for [name] tomorrow or next Thursday.

## 2018-02-22 NOTE — Progress Notes (Signed)
Patient Name: Larry Lutz  DOB: 08-Jun-1990  MRN: [id number]   Patient Active Problem List   Diagnosis Date Noted  . Syphilis contact, untreated 11/10/2017  . Neuropathy of right hand 05/11/2017  . Elevated cholesterol 02/25/2017  . High blood pressure 01/27/2017  . Human immunodeficiency virus (HIV) disease ([place]) 01/05/2017  . Depression with anxiety   . HIV antibody positive ([place]) 06/02/2016  . Ulcerative colitis, acute ([place]) 06/01/2016  . Gastrointestinal hemorrhage 06/01/2016   SUBJECTIVE: Brief Narrative:  Larry Lutz is a 27 y.o. AA male with HIV infection. Originally diagnosed with HIV in 2017 in [place] (no records available). Never on medications in the past but had initially undetectable VL until recently without explanation. History of OIs: none  Previous Regimen:   Biktarvy >> suppressed   Genotype:   wild type 12/2016.    HPI:  Has been having a hard time lately. [name] tells me that he was notified by the HD last month that he was a contact for syphilis - never went to HD for treatment and has failed to show up on multiple occasions here at [facility]. He has a spot on his penis that is non-painful but worrisome to him. Multiple sexual partners with all sites exposed and infrequent condom use. He has continued taking his Biktarvy daily and reports this is well tolerated; however over the last month he has had more nausea/vomiting where he has missed several doses. He was taking expired phenergan that seemed to be helping. He has had rectal bleeding for over a month now and carries h/o ulcerative colitis not currently on maintenance medications d/t cost. He never went to GI referral last time I saw him a year ago. He has blood when he wipes and sometimes in the toilet. Stool described to be loose/diarrhea and has associated cramping. He has been under a great deal of stress with losing hours at work, inability to afford bills, relationship stressors and has had worsening of  depression/anxiety. He would like to go back on medications for anxiety and depression (not remeron as this was not helpful for him). Denies any SI/HI. He is not using any drugs or drinking alcohol to excess.   Depression screen PHQ 2/9 05/11/2017  Decreased Interest 0  Down, Depressed, Hopeless 1  PHQ - 2 Score 1    Review of Systems  Constitutional: Negative for chills, fever, malaise/fatigue and weight loss.  HENT:       No dental problems  Respiratory: Negative for cough and sputum production.   Cardiovascular: Negative for chest pain and leg swelling.  Gastrointestinal: Positive for abdominal pain, blood in stool and diarrhea. Negative for vomiting.  Genitourinary: Negative for dysuria and flank pain.       Penile lesion   Musculoskeletal: Negative for joint pain, myalgias and neck pain.  Skin: Negative for rash.  Neurological: Negative for dizziness and headaches.  Psychiatric/Behavioral: Positive for depression. Negative for substance abuse and suicidal ideas. The patient is nervous/anxious and has insomnia.     Past Medical History:  Diagnosis Date  . Colitis   . HIV (human immunodeficiency virus infection) ([place])   . Hypertension    No Known Allergies   Outpatient Medications Prior to Visit  Medication Sig Dispense Refill  . bictegravir-emtricitabine-tenofovir AF (BIKTARVY) 50-200-25 MG TABS tablet Take 1 tablet by mouth daily. (Patient not taking: Reported on 02/22/2018) 30 tablet 5  . hydrOXYzine (ATARAX/VISTARIL) 25 MG tablet Take 1 tablet (25 mg total) by mouth every 6 (six)  hours as needed for anxiety. (Patient not taking: Reported on 01/05/2017) 30 tablet 0  . mirtazapine (REMERON) 15 MG tablet Take 1 tablet (15 mg total) at bedtime by mouth. (Patient not taking: Reported on 05/11/2017) 30 tablet 3  . traZODone (DESYREL) 50 MG tablet Take 1 tablet (50 mg total) by mouth at bedtime as needed for sleep. (Patient not taking: Reported on 01/05/2017) 30 tablet 0  .  predniSONE (STERAPRED UNI-PAK 21 TAB) 5 MG (21) TBPK tablet Take per package instructions. (Patient not taking: Reported on 02/22/2018) 21 tablet 0   No facility-administered medications prior to visit.     Social History   Tobacco Use  . Smoking status: Never Smoker  . Smokeless tobacco: Never Used  Substance Use Topics  . Alcohol use: No  . Drug use: Yes    Types: Marijuana, MDMA (Ecstacy)    Family History  Problem Relation Age of Onset  . Ulcerative colitis Mother    Male partners, infrequent condom use  Physical Exam and Objective Findings:  Vitals:   02/22/18 1623  BP: (!) 164/112  Pulse: 81  Temp: 98 F (36.7 C)  TempSrc: Oral  Weight: 143 lb (64.9 kg)  Height: 5' 6"  (1.676 m)   Body mass index is 23.08 kg/m.  Physical Exam  Constitutional: He is oriented to person, place, and time and well-developed, well-nourished, and in no distress.  Seated comfortably in chair. Appears fatigued.   HENT:  Mouth/Throat: No oral lesions. Normal dentition. No dental caries.  Eyes: No scleral icterus.  Cardiovascular: Normal rate, regular rhythm and normal heart sounds.  Pulmonary/Chest: Effort normal and breath sounds normal.  Abdominal: Soft. He exhibits no distension. There is abdominal tenderness (LLQ).  Genitourinary:    Genitourinary Comments: Healed circular scar on lateral shaft of penis near glans. No drainage, erythema, ulcerations.   Lymphadenopathy:    He has no cervical adenopathy.  Neurological: He is alert and oriented to person, place, and time.  Skin: Skin is warm and dry. No rash noted.  Psychiatric: Mood and affect normal.  Anxious, tearful  Vitals reviewed. Chaperone present for visit.   Lab Results Lab Results  Component Value Date   WBC 10.9 (H) 02/22/2018   HGB 14.5 02/22/2018   HCT 46.0 02/22/2018   MCV 71.8 (L) 02/22/2018   PLT 425 (H) 02/22/2018    Lab Results  Component Value Date   CREATININE 1.29 02/22/2018   BUN 12 02/22/2018    NA 141 02/22/2018   K 4.1 02/22/2018   CL 102 02/22/2018   CO2 32 02/22/2018    Lab Results  Component Value Date   ALT 19 02/22/2018   AST 26 02/22/2018   ALKPHOS 70 10/21/2016   BILITOT 0.5 02/22/2018    Lab Results  Component Value Date   CHOL 236 (H) 01/05/2017   HDL 45 01/05/2017   LDLCALC 163 (H) 01/05/2017   TRIG 138 01/05/2017   CHOLHDL 5.2 (H) 01/05/2017   HIV 1 RNA Quant (copies/mL)  Date Value  02/22/2018 25 (H)  10/17/2017 <20 DETECTED  05/11/2017 <20 DETECTED (A)   CD4 T Cell Abs (/uL)  Date Value  02/22/2018 690  10/17/2017 970  05/12/2017 940   Lab Results  Component Value Date   HAV REACTIVE (A) 01/05/2017   Lab Results  Component Value Date   HEPBSAG NON-REACTIVE 01/05/2017   HEPBSAB REACTIVE (A) 01/05/2017   No results found for: HCVAB Lab Results  Component Value Date   CHLAMYDIAWP **POSITIVE** (  A) 10/17/2017   N Negative 10/17/2017   ASSESSMENT & PLAN:    Problem List Items Addressed This Visit      Unprioritized   Depression with anxiety    Symptoms have been ongoing longer than 6 months and meets clinical criteria for depression and GAD - will start him on lexapro 5 mg QD x 7d then increase to 10 mg QD. Will have him meet with Marcie Bal for counseling sessions.       Relevant Medications   escitalopram (LEXAPRO) 10 MG tablet   Human immunodeficiency virus (HIV) disease (HCC) - Primary (Chronic)    Check HIV labs today - will give him rx for zofran to help with his nausea/vomiting to keep biktarvy down more consistently. Will refill Biktarvy - he needs to make appt with [name] for January to re-enroll in [facility] - discussed again the intervals to get this done.       Relevant Medications   cefTRIAXone (ROCEPHIN) injection 250 mg (Completed)   azithromycin (ZITHROMAX) tablet 1,000 mg (Completed)   penicillin g benzathine (BICILLIN LA) 1200000 UNIT/2ML injection 1.2 Million Units (Completed)   penicillin g benzathine (BICILLIN LA) 1200000  UNIT/2ML injection 1.2 Million Units (Completed)   Other Relevant Orders   RPR (Completed)   COMPLETE METABOLIC PANEL WITH GFR (Completed)   CBC with Differential/Platelet (Completed)   HIV-1 RNA quant-no reflex-bld (Completed)   T-helper cell (CD4)- ([facility] clinic only) (Completed)   Urine cytology ancillary only   Cytology (oral, anal, urethral) ancillary only   Cytology (oral, anal, urethral) ancillary only   HIV-1 Integrase Genotype   HIV-1 genotypr plus   HIV-1 genotypr plus   Syphilis contact, untreated    Reports he was contacted by HD about syphilis exposure/contact. Penile lesion today could be c/w healed chancre. Will administer 2.4 million units bicillin x 1 as well as treat for GC/C with azithromycin + rocephin today. Swabs of all sites obtained. Again counseled on safe sex practices. Condoms provided.       Ulcerative colitis, acute ([place])    He is concerned about flare of UC with anemia that may require transfusion. Will check CBC today, however he does not appear anemic on exam and hemodynamics are not c/w this either (hypertensive). Will do course of prednisone and get him referred to GI for management. Provided [employer] today as well for payor access.       Relevant Medications   ondansetron (ZOFRAN-ODT) 4 MG disintegrating tablet   predniSONE (DELTASONE) 20 MG tablet   Other Relevant Orders   Ambulatory referral to Gastroenterology    Other Visit Diagnoses    Screening examination for venereal disease       Relevant Medications   cefTRIAXone (ROCEPHIN) injection 250 mg (Completed)   azithromycin (ZITHROMAX) tablet 1,000 mg (Completed)   penicillin g benzathine (BICILLIN LA) 1200000 UNIT/2ML injection 1.2 Million Units (Completed)   penicillin g benzathine (BICILLIN LA) 1200000 UNIT/2ML injection 1.2 Million Units (Completed)   Other Relevant Orders   RPR (Completed)   Urine cytology ancillary only   Cytology (oral, anal, urethral) ancillary only    Cytology (oral, anal, urethral) ancillary only   High risk homosexual behavior       Relevant Medications   cefTRIAXone (ROCEPHIN) injection 250 mg (Completed)   azithromycin (ZITHROMAX) tablet 1,000 mg (Completed)   penicillin g benzathine (BICILLIN LA) 1200000 UNIT/2ML injection 1.2 Million Units (Completed)   penicillin g benzathine (BICILLIN LA) 1200000 UNIT/2ML injection 1.2 Million Units (Completed)  Depression, unspecified depression type       Relevant Medications   escitalopram (LEXAPRO) 10 MG tablet     Return in about 4 weeks (around 03/22/2018).   Janene Madeira, MSN, NP-C [facility] for [facility] [place] [facility] Pager: [phone]  02/24/2018  3:10 PM

## 2018-02-23 ENCOUNTER — Ambulatory Visit: Payer: Self-pay

## 2018-02-24 LAB — COMPLETE METABOLIC PANEL WITH GFR
AG Ratio: 1.2 (calc) (ref 1.0–2.5)
ALT: 19 U/L (ref 9–46)
AST: 26 U/L (ref 10–40)
Albumin: 4.4 g/dL (ref 3.6–5.1)
Alkaline phosphatase (APISO): 128 U/L — ABNORMAL HIGH (ref 40–115)
BILIRUBIN TOTAL: 0.5 mg/dL (ref 0.2–1.2)
BUN: 12 mg/dL (ref 7–25)
CO2: 32 mmol/L (ref 20–32)
Calcium: 10.3 mg/dL (ref 8.6–10.3)
Chloride: 102 mmol/L (ref 98–110)
Creat: 1.29 mg/dL (ref 0.60–1.35)
GFR, Est African American: 87 mL/min/{1.73_m2} (ref >=60)
GFR, Est Non African American: 75 mL/min/{1.73_m2} (ref >=60)
GLOBULIN: 3.7 g/dL (ref 1.9–3.7)
Glucose, Bld: 73 mg/dL (ref 65–99)
Potassium: 4.1 mmol/L (ref 3.5–5.3)
SODIUM: 141 mmol/L (ref 135–146)
TOTAL PROTEIN: 8.1 g/dL (ref 6.1–8.1)

## 2018-02-24 LAB — CBC WITH DIFFERENTIAL/PLATELET
Basophils Absolute: 65 cells/uL (ref 0–200)
Basophils Relative: 0.6 %
Eosinophils Absolute: 491 cells/uL (ref 15–500)
Eosinophils Relative: 4.5 %
HCT: 46 % (ref 38.5–50.0)
Hemoglobin: 14.5 g/dL (ref 13.2–17.1)
Lymphs Abs: 2344 cells/uL (ref 850–3900)
MCH: 22.6 pg — ABNORMAL LOW (ref 27.0–33.0)
MCHC: 31.5 g/dL — ABNORMAL LOW (ref 32.0–36.0)
MCV: 71.8 fL — ABNORMAL LOW (ref 80.0–100.0)
MPV: 10.1 fL (ref 7.5–12.5)
Monocytes Relative: 9.5 %
Neutro Abs: 6965 cells/uL (ref 1500–7800)
Neutrophils Relative %: 63.9 %
PLATELETS: 425 10*3/uL — AB (ref 140–400)
RBC: 6.41 10*6/uL — ABNORMAL HIGH (ref 4.20–5.80)
RDW: 15.9 % — AB (ref 11.0–15.0)
Total Lymphocyte: 21.5 %
WBC: 10.9 10*3/uL — ABNORMAL HIGH (ref 3.8–10.8)
WBCMIX: 1036 {cells}/uL — AB (ref 200–950)

## 2018-02-24 LAB — HIV-1 RNA QUANT-NO REFLEX-BLD
HIV 1 RNA QUANT: 25 {copies}/mL — AB
HIV-1 RNA Quant, Log: 1.4 Log copies/mL — ABNORMAL HIGH

## 2018-02-24 LAB — CYTOLOGY, (ORAL, ANAL, URETHRAL) ANCILLARY ONLY
Chlamydia: NEGATIVE
Neisseria Gonorrhea: NEGATIVE

## 2018-02-24 LAB — URINE CYTOLOGY ANCILLARY ONLY
CHLAMYDIA, DNA PROBE: NEGATIVE
Neisseria Gonorrhea: NEGATIVE

## 2018-02-24 LAB — T-HELPER CELL (CD4) - (RCID CLINIC ONLY)
CD4 T CELL HELPER: 30 % — AB (ref 33–55)
CD4 T Cell Abs: 690 /uL (ref 400–2700)

## 2018-02-24 LAB — RPR: RPR Ser Ql: NONREACTIVE

## 2018-02-24 NOTE — Assessment & Plan Note (Signed)
Reports he was contacted by HD about syphilis exposure/contact. Penile lesion today could be c/w healed chancre. Will administer 2.4 million units bicillin x 1 as well as treat for GC/C with azithromycin + rocephin today. Swabs of all sites obtained. Again counseled on safe sex practices. Condoms provided.

## 2018-02-24 NOTE — Assessment & Plan Note (Signed)
Check HIV labs today - will give him rx for zofran to help with his nausea/vomiting to keep biktarvy down more consistently. Will refill Biktarvy - he needs to make appt with [name] for January to re-enroll in [facility] - discussed again the intervals to get this done.

## 2018-02-24 NOTE — Assessment & Plan Note (Signed)
Symptoms have been ongoing longer than 6 months and meets clinical criteria for depression and GAD - will start him on lexapro 5 mg QD x 7d then increase to 10 mg QD. Will have him meet with Marcie Bal for counseling sessions.

## 2018-02-24 NOTE — Assessment & Plan Note (Addendum)
He is concerned about flare of UC with anemia that may require transfusion. Will check CBC today, however he does not appear anemic on exam and hemodynamics are not c/w this either (hypertensive). Will do course of prednisone and get him referred to GI for management. Provided [employer] today as well for payor access.

## 2018-02-28 LAB — CYTOLOGY, (ORAL, ANAL, URETHRAL) ANCILLARY ONLY
Chlamydia: NEGATIVE
Neisseria Gonorrhea: NEGATIVE

## 2018-03-06 LAB — HIV-1 INTEGRASE GENOTYPE

## 2018-03-06 LAB — HIV-1 GENOTYPE: HIV-1 Genotype: NOT DETECTED

## 2018-03-27 ENCOUNTER — Other Ambulatory Visit: Payer: Self-pay

## 2018-03-27 DIAGNOSIS — K51911 Ulcerative colitis, unspecified with rectal bleeding: Secondary | ICD-10-CM

## 2018-03-27 MED ORDER — PREDNISONE 20 MG PO TABS: ORAL_TABLET | ORAL | 0 refills | Status: DC

## 2018-03-30 ENCOUNTER — Ambulatory Visit: Payer: Self-pay | Admitting: Physician Assistant

## 2018-04-04 ENCOUNTER — Ambulatory Visit: Payer: Self-pay | Admitting: Nurse Practitioner

## 2018-04-27 ENCOUNTER — Encounter: Payer: Self-pay | Admitting: Nurse Practitioner

## 2018-05-01 ENCOUNTER — Encounter (HOSPITAL_COMMUNITY): Payer: Self-pay

## 2018-05-01 ENCOUNTER — Emergency Department (HOSPITAL_COMMUNITY)
Admission: EM | Admit: 2018-05-01 | Discharge: 2018-05-02 | Discharge status: Against Medical Advice | Payer: Self-pay | Attending: Emergency Medicine | Admitting: Emergency Medicine

## 2018-05-01 ENCOUNTER — Other Ambulatory Visit: Payer: Self-pay

## 2018-05-01 ENCOUNTER — Emergency Department (HOSPITAL_COMMUNITY): Payer: Self-pay

## 2018-05-01 DIAGNOSIS — L02219 Cutaneous abscess of trunk, unspecified: Secondary | ICD-10-CM | POA: Insufficient documentation

## 2018-05-01 DIAGNOSIS — K529 Noninfective gastroenteritis and colitis, unspecified: Secondary | ICD-10-CM | POA: Insufficient documentation

## 2018-05-01 DIAGNOSIS — I10 Essential (primary) hypertension: Secondary | ICD-10-CM | POA: Insufficient documentation

## 2018-05-01 DIAGNOSIS — K51918 Ulcerative colitis, unspecified with other complication: Secondary | ICD-10-CM

## 2018-05-01 DIAGNOSIS — N611 Abscess of the breast and nipple: Secondary | ICD-10-CM

## 2018-05-01 DIAGNOSIS — Z79899 Other long term (current) drug therapy: Secondary | ICD-10-CM | POA: Insufficient documentation

## 2018-05-01 LAB — CBC
HCT: 38.5 % — ABNORMAL LOW (ref 39.0–52.0)
Hemoglobin: 11.6 g/dL — ABNORMAL LOW (ref 13.0–17.0)
MCH: 22.6 pg — ABNORMAL LOW (ref 26.0–34.0)
MCHC: 30.1 g/dL (ref 30.0–36.0)
MCV: 74.9 fL — ABNORMAL LOW (ref 80.0–100.0)
Platelets: 451 10*3/uL — ABNORMAL HIGH (ref 150–400)
RBC: 5.14 MIL/uL (ref 4.22–5.81)
RDW: 15 % (ref 11.5–15.5)
WBC: 14.3 10*3/uL — AB (ref 4.0–10.5)
nRBC: 0 % (ref 0.0–0.2)

## 2018-05-01 LAB — COMPREHENSIVE METABOLIC PANEL
ALT: 18 U/L (ref 0–44)
AST: 21 U/L (ref 15–41)
Albumin: 3.3 g/dL — ABNORMAL LOW (ref 3.5–5.0)
Alkaline Phosphatase: 76 U/L (ref 38–126)
Anion gap: 8 (ref 5–15)
BUN: 15 mg/dL (ref 6–20)
CHLORIDE: 97 mmol/L — AB (ref 98–111)
CO2: 30 mmol/L (ref 22–32)
Calcium: 8.9 mg/dL (ref 8.9–10.3)
Creatinine, Ser: 1.27 mg/dL — ABNORMAL HIGH (ref 0.61–1.24)
GFR calc Af Amer: >60 mL/min (ref >60)
Glucose, Bld: 100 mg/dL — ABNORMAL HIGH (ref 70–99)
Potassium: 4.1 mmol/L (ref 3.5–5.1)
Sodium: 135 mmol/L (ref 135–145)
Total Bilirubin: 0.8 mg/dL (ref 0.3–1.2)
Total Protein: 7.8 g/dL (ref 6.5–8.1)

## 2018-05-01 LAB — POC OCCULT BLOOD, ED: Fecal Occult Bld: POSITIVE — AB

## 2018-05-01 MED ORDER — LIDOCAINE HCL (PF) 1 % IJ SOLN
10.0000 mL | Freq: Once | INTRAMUSCULAR | Status: AC
  Administered 2018-05-01: 10 mL
  Filled 2018-05-01: qty 30

## 2018-05-01 MED ORDER — IOPAMIDOL (ISOVUE-300) INJECTION 61%
100.0000 mL | Freq: Once | INTRAVENOUS | Status: AC | PRN
  Administered 2018-05-01: 100 mL via INTRAVENOUS

## 2018-05-01 MED ORDER — SODIUM CHLORIDE (PF) 0.9 % IJ SOLN
INTRAMUSCULAR | Status: AC
  Filled 2018-05-01: qty 50

## 2018-05-01 MED ORDER — SODIUM CHLORIDE 0.9 % IV BOLUS
1000.0000 mL | Freq: Once | INTRAVENOUS | Status: AC
  Administered 2018-05-01: 1000 mL via INTRAVENOUS

## 2018-05-01 MED ORDER — OXYCODONE-ACETAMINOPHEN 5-325 MG PO TABS
1.0000 | ORAL_TABLET | Freq: Once | ORAL | Status: AC
  Administered 2018-05-01: 1 via ORAL
  Filled 2018-05-01: qty 1

## 2018-05-01 MED ORDER — METHYLPREDNISOLONE SODIUM SUCC 125 MG IJ SOLR
125.0000 mg | Freq: Once | INTRAMUSCULAR | Status: AC
  Administered 2018-05-01: 125 mg via INTRAVENOUS
  Filled 2018-05-01: qty 2

## 2018-05-01 MED ORDER — IOPAMIDOL (ISOVUE-300) INJECTION 61%
INTRAVENOUS | Status: AC
  Filled 2018-05-01: qty 100

## 2018-05-01 NOTE — ED Notes (Addendum)
No answer from patient when called for room x 1. Pt later noted to be in bathroom, will be called for next room.

## 2018-05-01 NOTE — ED Provider Notes (Signed)
[facility] DEPT Provider Note   CSN: [id number] Arrival date & time: 05/01/18  1203    History   Chief Complaint Chief Complaint  Patient presents with  . Rectal Bleeding  . Breast Discharge    HPI Larry Lutz is a 28 y.o. male.     Patient is HIV positive, sexually active with males. Patient is compliant with medications. Partners wear condoms. Patient with history of ulcerative colitis. Has been symptom free for about 2 years. Patient reports frequent rectal bleeding with constant rectal pain. Last anal sex attempt 2.5 weeks ago. Patient states he was already experiencing some rectal bleeding prior to that date. Patient also complaining of right breast/nipple pain. Area around nipple is erythematous, edematous, and tender to palpation. Patient had a nipple ring at that location, which has been removed. He reports pus-like drainage from the pierced hole.  The history is provided by the patient. No language interpreter was used.  Rectal Bleeding  Quality:  Bright red Amount:  Copious Duration:  3 weeks Timing:  Constant Chronicity:  New Context: anal penetration, defecation, diarrhea and rectal pain   Pain details:    Quality:  Throbbing and tearing   Severity:  Moderate   Duration:  3 weeks   Timing:  Constant Similar prior episodes: no   Associated symptoms: abdominal pain   Associated symptoms: no fever     Past Medical History:  Diagnosis Date  . Colitis   . HIV (human immunodeficiency virus infection) ([place])   . Hypertension     Patient Active Problem List   Diagnosis Date Noted  . Syphilis contact, untreated 11/10/2017  . Neuropathy of right hand 05/11/2017  . Elevated cholesterol 02/25/2017  . High blood pressure 01/27/2017  . Human immunodeficiency virus (HIV) disease ([place]) 01/05/2017  . Depression with anxiety   . HIV antibody positive ([place]) 06/02/2016  . Ulcerative colitis, acute ([place]) 06/01/2016  . Gastrointestinal  hemorrhage 06/01/2016    Past Surgical History:  Procedure Laterality Date  . BRAIN SURGERY          Home Medications    Prior to Admission medications   Medication Sig Start Date End Date Taking? Authorizing Provider  bictegravir-emtricitabine-tenofovir AF (BIKTARVY) 50-200-25 MG TABS tablet Take 1 tablet by mouth daily. Patient not taking: Reported on 02/22/2018 11/28/17   Kuppelweiser, Cassie L, RPH-CPP  escitalopram (LEXAPRO) 10 MG tablet Take 0.5 tablets (5 mg total) by mouth daily for 7 days, THEN 1 tablet (10 mg total) daily for 23 days. 02/22/18 03/24/18  Lincoln Callas, NP  hydrOXYzine (ATARAX/VISTARIL) 25 MG tablet Take 1 tablet (25 mg total) by mouth every 6 (six) hours as needed for anxiety. Patient not taking: Reported on 01/05/2017 10/23/16   Mordecai Maes, NP  mirtazapine (REMERON) 15 MG tablet Take 1 tablet (15 mg total) at bedtime by mouth. Patient not taking: Reported on 05/11/2017 01/27/17   [place]land Callas, NP  ondansetron (ZOFRAN-ODT) 4 MG disintegrating tablet Take 1 tablet (4 mg total) by mouth every 8 (eight) hours as needed for nausea or vomiting. 02/22/18   [place] Callas, NP  predniSONE (DELTASONE) 20 MG tablet 2 tabs with food daily x 14 days. THEN 1 tab daily for 4 days. THEN 1/2 tab daily until gone 03/27/18   [place] Callas, NP  traZODone (DESYREL) 50 MG tablet Take 1 tablet (50 mg total) by mouth at bedtime as needed for sleep. Patient not taking: Reported on 01/05/2017 10/23/16   Mordecai Maes,  NP    Family History Family History  Problem Relation Age of Onset  . Ulcerative colitis Mother     Social History Social History   Tobacco Use  . Smoking status: Never Smoker  . Smokeless tobacco: Never Used  Substance Use Topics  . Alcohol use: No  . Drug use: Yes    Types: Marijuana, MDMA (Ecstacy)     Allergies   Patient has no known allergies.   Review of Systems Review of Systems  Constitutional: Negative for fever.   Gastrointestinal: Positive for abdominal pain, anal bleeding, blood in stool, diarrhea, hematochezia and rectal pain.  All other systems reviewed and are negative.    Physical Exam Updated Vital Signs BP (!) 149/95 (BP Location: Right Arm)   Pulse (!) 107   Temp 98.2 F (36.8 C) (Oral)   Resp 16   Ht 5' 6"  (1.676 m)   Wt 65.8 kg   SpO2 100%   BMI 23.40 kg/m   Physical Exam Vitals signs and nursing note reviewed.  Constitutional:      General: He is not in acute distress.    Appearance: Normal appearance. He is not ill-appearing.  HENT:     Head: Normocephalic.  Eyes:     Conjunctiva/sclera: Conjunctivae normal.  Cardiovascular:     Rate and Rhythm: Normal rate and regular rhythm.  Pulmonary:     Effort: Pulmonary effort is normal.     Breath sounds: Normal breath sounds.  Abdominal:     General: There is no distension.     Palpations: Abdomen is soft.     Tenderness: There is abdominal tenderness.  Genitourinary:    Rectum: Tenderness present. No anal fissure or external hemorrhoid. Normal anal tone.     Comments: Patient extremely tender at anus when obtaining hemoccult sample. Neurological:     Mental Status: He is alert.      ED Treatments / Results  Labs (all labs ordered are listed, but only abnormal results are displayed) Labs Reviewed  COMPREHENSIVE METABOLIC PANEL - Abnormal; Notable for the following components:      Result Value   Chloride 97 (*)    Glucose, Bld 100 (*)    Creatinine, Ser 1.27 (*)    Albumin 3.3 (*)    All other components within normal limits  CBC - Abnormal; Notable for the following components:   WBC 14.3 (*)    Hemoglobin 11.6 (*)    HCT 38.5 (*)    MCV 74.9 (*)    MCH 22.6 (*)    Platelets 451 (*)    All other components within normal limits  POC OCCULT BLOOD, ED  TYPE AND SCREEN  ABO/RH    EKG None  Radiology No results found.  Procedures .Marland KitchenIncision and Drainage Date/Time: 05/01/2018 11:30 PM Performed by:  Etta Quill, NP Authorized by: Etta Quill, NP   Consent:    Consent obtained:  Verbal   Consent given by:  Patient   Risks discussed:  Incomplete drainage, pain and bleeding   Alternatives discussed:  No treatment Location:    Type:  Abscess   Size:  1.5 x 3 cm   Location:  Trunk   Trunk location:  R breast Pre-procedure details:    Skin preparation:  Betadine Anesthesia (see MAR for exact dosages):    Anesthesia method:  Local infiltration   Local anesthetic:  Lidocaine 1% w/o epi Procedure type:    Complexity:  Simple Procedure details:    Incision types:  Stab incision   Incision depth:  Dermal   Scalpel blade:  11   Wound management:  Probed and deloculated   Drainage:  Bloody and purulent   Drainage amount:  Copious   Wound treatment:  Wound left open Post-procedure details:    Patient tolerance of procedure:  Tolerated well, no immediate complications   (including critical care time)  Medications Ordered in ED Medications  iopamidol (ISOVUE-300) 61 % injection (has no administration in time range)  sodium chloride (PF) 0.9 % injection (has no administration in time range)  sodium chloride 0.9 % bolus 1,000 mL (1,000 mLs Intravenous New Bag/Given 05/01/18 2359)  iopamidol (ISOVUE-300) 61 % injection 100 mL (100 mLs Intravenous Contrast Given 05/01/18 1911)  oxyCODONE-acetaminophen (PERCOCET/ROXICET) 5-325 MG per tablet 1-2 tablet (1 tablet Oral Given 05/01/18 2133)  lidocaine (PF) (XYLOCAINE) 1 % injection 10 mL (10 mLs Infiltration Given 05/01/18 2159)  methylPREDNISolone sodium succinate (SOLU-MEDROL) 125 mg/2 mL injection 125 mg (125 mg Intravenous Given 05/01/18 2359)     Initial Impression / Assessment and Plan / ED Course  I have reviewed the triage vital signs and the nursing notes.  Pertinent labs & imaging results that were available during my care of the patient were reviewed by me and considered in my medical decision making (see chart for details).        Patient is nontoxic, nonseptic appearing. He continues to have multiple episodes of diarrhea, frequently bloody. Fluid bolus given.  Labs, imaging and vitals reviewed. No evidence of significant anemia. Not orthostatic. Concern for colitis exacerbation (inflammatory vs infectious). Patient received dose of steroids in the ED Recommendation for admission for hydration and symptom management. Patient reports that he is starting a new job in the morning after a prolonged period of unemployment, and does not want to be admitted at this time. Patient will sign out against medical advice, but is encouraged to return in symptoms worsen or do not improve. He has an appointment with his gastroenterologist on March 3.  On repeat exam patient does not have a surgical abdomen and there are no peritoneal signs.  Patient sent home with steroids and limited pain medications. I have also discussed reasons to return immediately to the ER.  Patient expresses understanding.  Patient with skin abscess. Incision and drainage performed in the ED today.  Abscess was not large enough to warrant packing or drain placement. Wound recheck in 2 days. Supportive care and return precautions discussed.   Final Clinical Impressions(s) / ED Diagnoses   Final diagnoses:  Abscess of right nipple  Colitis, chronic, ulcerative, other complication [facility])    ED Discharge Orders         Ordered    predniSONE (DELTASONE) 20 MG tablet     05/02/18 0019    HYDROcodone-acetaminophen (NORCO/VICODIN) 5-325 MG tablet  Every 6 hours PRN     05/02/18 0019           Etta Quill, NP 05/02/18 0037    Lacretia Leigh, MD 05/03/18 1530

## 2018-05-01 NOTE — ED Notes (Signed)
Bed: WA04 Expected date:  Expected time:  Means of arrival:  Comments: Hold for triage

## 2018-05-01 NOTE — Discharge Instructions (Addendum)
Take medication as directed. You are welcome to return at any time should your symptoms not improve or worsen, or for any other concern. You have culture results pending, and will be contacted should there be a modification of your treatment.

## 2018-05-01 NOTE — ED Notes (Signed)
Lidocaine and I&D tray at bedside

## 2018-05-01 NOTE — ED Triage Notes (Signed)
Pt states he has had rectal bleeding for 3 weeks. Pt states that he is having rectal pressure. Pt states that he is using the bathroom frequently..  Pt states that he just took out his nipple rings and there is pain in both, worse in the left. Pt states that he has had no relief with cabbage leaves.

## 2018-05-02 LAB — GASTROINTESTINAL PANEL BY PCR, STOOL (REPLACES STOOL CULTURE)

## 2018-05-02 LAB — C DIFFICILE QUICK SCREEN W PCR REFLEX
C DIFFICILE (CDIFF) INTERP: NOT DETECTED
C Diff antigen: NEGATIVE
C Diff toxin: NEGATIVE

## 2018-05-02 LAB — ABO/RH: ABO/RH(D): A POS

## 2018-05-02 MED ORDER — PREDNISONE 20 MG PO TABS: ORAL_TABLET | ORAL | 0 refills | Status: DC

## 2018-05-02 MED ORDER — HYDROCODONE-ACETAMINOPHEN 5-325 MG PO TABS: 1.0000 | ORAL_TABLET | Freq: Four times a day (QID) | ORAL | 0 refills | Status: DC | PRN

## 2018-05-03 ENCOUNTER — Inpatient Hospital Stay (HOSPITAL_COMMUNITY)
Admission: EM | Admit: 2018-05-03 | Discharge: 2018-05-04 | DRG: 386 | Disposition: A | Discharge status: Home / Self Care | Payer: Self-pay | Attending: Internal Medicine | Admitting: Internal Medicine

## 2018-05-03 ENCOUNTER — Other Ambulatory Visit: Payer: Self-pay

## 2018-05-03 ENCOUNTER — Encounter (HOSPITAL_COMMUNITY): Payer: Self-pay | Admitting: Emergency Medicine

## 2018-05-03 DIAGNOSIS — Z21 Asymptomatic human immunodeficiency virus [HIV] infection status: Secondary | ICD-10-CM | POA: Diagnosis present

## 2018-05-03 DIAGNOSIS — Z79899 Other long term (current) drug therapy: Secondary | ICD-10-CM

## 2018-05-03 DIAGNOSIS — K5190 Ulcerative colitis, unspecified, without complications: Principal | ICD-10-CM | POA: Diagnosis present

## 2018-05-03 DIAGNOSIS — Z9114 Patient's other noncompliance with medication regimen: Secondary | ICD-10-CM

## 2018-05-03 DIAGNOSIS — K625 Hemorrhage of anus and rectum: Secondary | ICD-10-CM

## 2018-05-03 DIAGNOSIS — D649 Anemia, unspecified: Secondary | ICD-10-CM | POA: Diagnosis present

## 2018-05-03 DIAGNOSIS — I10 Essential (primary) hypertension: Secondary | ICD-10-CM | POA: Diagnosis present

## 2018-05-03 DIAGNOSIS — K51911 Ulcerative colitis, unspecified with rectal bleeding: Secondary | ICD-10-CM

## 2018-05-03 DIAGNOSIS — B20 Human immunodeficiency virus [HIV] disease: Secondary | ICD-10-CM | POA: Diagnosis present

## 2018-05-03 DIAGNOSIS — D62 Acute posthemorrhagic anemia: Secondary | ICD-10-CM | POA: Diagnosis present

## 2018-05-03 DIAGNOSIS — N611 Abscess of the breast and nipple: Secondary | ICD-10-CM | POA: Diagnosis present

## 2018-05-03 DIAGNOSIS — K921 Melena: Secondary | ICD-10-CM | POA: Diagnosis present

## 2018-05-03 LAB — URINALYSIS, ROUTINE W REFLEX MICROSCOPIC
Bacteria, UA: NONE SEEN
Bilirubin Urine: NEGATIVE
Glucose, UA: NEGATIVE mg/dL
Ketones, ur: NEGATIVE mg/dL
Leukocytes,Ua: NEGATIVE
Nitrite: NEGATIVE
PROTEIN: NEGATIVE mg/dL
Specific Gravity, Urine: 1.011 (ref 1.005–1.030)
pH: 8 (ref 5.0–8.0)

## 2018-05-03 LAB — LACTIC ACID, PLASMA: Lactic Acid, Venous: 1 mmol/L (ref 0.5–1.9)

## 2018-05-03 LAB — C-REACTIVE PROTEIN: CRP: 7.5 mg/dL — ABNORMAL HIGH (ref <1.0)

## 2018-05-03 LAB — COMPREHENSIVE METABOLIC PANEL
ALT: 19 U/L (ref 0–44)
AST: 20 U/L (ref 15–41)
Albumin: 3.1 g/dL — ABNORMAL LOW (ref 3.5–5.0)
Alkaline Phosphatase: 68 U/L (ref 38–126)
Anion gap: 7 (ref 5–15)
BUN: 15 mg/dL (ref 6–20)
CO2: 30 mmol/L (ref 22–32)
Calcium: 8.5 mg/dL — ABNORMAL LOW (ref 8.9–10.3)
Chloride: 101 mmol/L (ref 98–111)
Creatinine, Ser: 1.14 mg/dL (ref 0.61–1.24)
GFR calc Af Amer: >60 mL/min (ref >60)
GFR calc non Af Amer: >60 mL/min (ref >60)
Glucose, Bld: 88 mg/dL (ref 70–99)
Potassium: 4 mmol/L (ref 3.5–5.1)
Sodium: 138 mmol/L (ref 135–145)
Total Bilirubin: 0.3 mg/dL (ref 0.3–1.2)
Total Protein: 7.4 g/dL (ref 6.5–8.1)

## 2018-05-03 LAB — CBC WITH DIFFERENTIAL/PLATELET
ABS IMMATURE GRANULOCYTES: 0.25 10*3/uL — AB (ref 0.00–0.07)
Basophils Absolute: 0 10*3/uL (ref 0.0–0.1)
Basophils Relative: 0 %
Eosinophils Absolute: 0.1 10*3/uL (ref 0.0–0.5)
Eosinophils Relative: 1 %
HCT: 36.9 % — ABNORMAL LOW (ref 39.0–52.0)
HEMOGLOBIN: 10.9 g/dL — AB (ref 13.0–17.0)
Immature Granulocytes: 2 %
Lymphocytes Relative: 23 %
Lymphs Abs: 2.9 10*3/uL (ref 0.7–4.0)
MCH: 22.5 pg — ABNORMAL LOW (ref 26.0–34.0)
MCHC: 29.5 g/dL — ABNORMAL LOW (ref 30.0–36.0)
MCV: 76.1 fL — ABNORMAL LOW (ref 80.0–100.0)
Monocytes Absolute: 2 10*3/uL — ABNORMAL HIGH (ref 0.1–1.0)
Monocytes Relative: 16 %
NEUTROS ABS: 7.5 10*3/uL (ref 1.7–7.7)
Neutrophils Relative %: 58 %
Platelets: 404 10*3/uL — ABNORMAL HIGH (ref 150–400)
RBC: 4.85 MIL/uL (ref 4.22–5.81)
RDW: 15.2 % (ref 11.5–15.5)
WBC: 12.8 10*3/uL — ABNORMAL HIGH (ref 4.0–10.5)
nRBC: 0 % (ref 0.0–0.2)

## 2018-05-03 LAB — TYPE AND SCREEN
ABO/RH(D): A POS
ABO/RH(D): A POS
Antibody Screen: NEGATIVE
Antibody Screen: NEGATIVE

## 2018-05-03 LAB — SEDIMENTATION RATE: Sed Rate: 65 mm/hr — ABNORMAL HIGH (ref 0–16)

## 2018-05-03 MED ORDER — LACTATED RINGERS IV BOLUS
1000.0000 mL | Freq: Once | INTRAVENOUS | Status: AC
  Administered 2018-05-03: 1000 mL via INTRAVENOUS

## 2018-05-03 MED ORDER — MORPHINE SULFATE (PF) 4 MG/ML IV SOLN
4.0000 mg | Freq: Once | INTRAVENOUS | Status: AC
  Administered 2018-05-03: 4 mg via INTRAVENOUS
  Filled 2018-05-03: qty 1

## 2018-05-03 MED ORDER — ACETAMINOPHEN 650 MG RE SUPP: 650.0000 mg | Freq: Four times a day (QID) | RECTAL | Status: DC | PRN

## 2018-05-03 MED ORDER — MESALAMINE 1.2 G PO TBEC
4.8000 g | DELAYED_RELEASE_TABLET | Freq: Every day | ORAL | Status: DC
  Administered 2018-05-04: 4.8 g via ORAL
  Filled 2018-05-03: qty 4

## 2018-05-03 MED ORDER — BICTEGRAVIR-EMTRICITAB-TENOFOV 50-200-25 MG PO TABS
1.0000 | ORAL_TABLET | Freq: Every day | ORAL | Status: DC
  Administered 2018-05-03 – 2018-05-04 (×2): 1 via ORAL
  Filled 2018-05-03 (×2): qty 1

## 2018-05-03 MED ORDER — DOXYCYCLINE HYCLATE 100 MG PO TABS
100.0000 mg | ORAL_TABLET | Freq: Once | ORAL | Status: AC
  Administered 2018-05-03: 100 mg via ORAL
  Filled 2018-05-03: qty 1

## 2018-05-03 MED ORDER — OXYCODONE HCL 5 MG PO TABS
5.0000 mg | ORAL_TABLET | ORAL | Status: DC | PRN
  Administered 2018-05-03 – 2018-05-04 (×3): 5 mg via ORAL
  Filled 2018-05-03 (×3): qty 1

## 2018-05-03 MED ORDER — METHYLPREDNISOLONE SODIUM SUCC 125 MG IJ SOLR
125.0000 mg | Freq: Once | INTRAMUSCULAR | Status: AC
  Administered 2018-05-03: 125 mg via INTRAVENOUS
  Filled 2018-05-03: qty 2

## 2018-05-03 MED ORDER — ACETAMINOPHEN 325 MG PO TABS: 650.0000 mg | ORAL_TABLET | Freq: Four times a day (QID) | ORAL | Status: DC | PRN

## 2018-05-03 MED ORDER — METHYLPREDNISOLONE SODIUM SUCC 40 MG IJ SOLR
40.0000 mg | Freq: Two times a day (BID) | INTRAMUSCULAR | Status: DC
  Administered 2018-05-03 – 2018-05-04 (×2): 40 mg via INTRAVENOUS
  Filled 2018-05-03 (×2): qty 1

## 2018-05-03 MED ORDER — ONDANSETRON HCL 4 MG/2ML IJ SOLN: 4.0000 mg | Freq: Four times a day (QID) | INTRAMUSCULAR | Status: DC | PRN

## 2018-05-03 MED ORDER — DOXYCYCLINE HYCLATE 100 MG PO TABS
100.0000 mg | ORAL_TABLET | Freq: Two times a day (BID) | ORAL | Status: DC
  Administered 2018-05-03 – 2018-05-04 (×2): 100 mg via ORAL
  Filled 2018-05-03 (×2): qty 1

## 2018-05-03 MED ORDER — SODIUM CHLORIDE 0.45 % IV SOLN
INTRAVENOUS | Status: DC
  Administered 2018-05-03 – 2018-05-04 (×2): via INTRAVENOUS

## 2018-05-03 MED ORDER — ONDANSETRON HCL 4 MG PO TABS: 4.0000 mg | ORAL_TABLET | Freq: Four times a day (QID) | ORAL | Status: DC | PRN

## 2018-05-03 MED ORDER — HYDROMORPHONE HCL 1 MG/ML IJ SOLN
0.5000 mg | INTRAMUSCULAR | Status: DC | PRN
  Administered 2018-05-04: 1 mg via INTRAVENOUS
  Filled 2018-05-03: qty 1

## 2018-05-03 NOTE — H&P (Signed)
[facility] History and Physical  Larry Lutz ZTI:[id number] DOB: 12/06/1990 DOA: 05/03/2018   PCP: Patient, No Pcp Per  Specialists: Followed by infectious disease clinic  Chief Complaint: Passing blood in stool  HPI: Larry Lutz is a 28 y.o. male with a past medical history of ulcerative colitis and HIV disease not compliant with his antiretroviral treatment.  He ran out of his medications a few days ago and has not refilled them.  He mentions that for the past month or so he has been noticing blood in his stool.  He has lost about 15 pounds in that time..  And in this timeframe his bleeding has worsened.  He developed abdominal pain.  He came into the emergency department 2 days ago with a similar complaints.  He underwent evaluation including a CT scan which suggested colitis.  He was tested for C. difficile which was negative.  GI pathogen panel was also unremarkable.  Patient was thought to have a flare of ulcerative colitis.  He was discharged on steroid taper.  Symptoms did not improve so he decided to come back to the hospital.  He does not follow with any gastroenterologist here in town.  His last colonoscopy was about 2 years or so ago when he was in [place].  He mentions that his pain is 10 out of 10 in intensity without any radiation.  Associated with some nausea.  He also mentions pain around his right nipple area and 2 days ago he was found to have infection there.  He underwent I&D.  Evaluation in the emergency department revealed that his hemoglobin had dropped some more compared to when it was checked 2 days ago.  Case was discussed with gastroenterology.  He will be hospitalized for further management.  Home Medications: Prior to Admission medications   Medication Sig Start Date End Date Taking? Authorizing Provider  acetaminophen (TYLENOL) 500 MG tablet Take 1,000 mg by mouth 2 (two) times daily as needed for moderate pain.   Yes [provider]    bictegravir-emtricitabine-tenofovir AF (BIKTARVY) 50-200-25 MG TABS tablet Take 1 tablet by mouth daily. 11/28/17  Yes Kuppelweiser, Cassie L, RPH-CPP  escitalopram (LEXAPRO) 10 MG tablet Take 0.5 tablets (5 mg total) by mouth daily for 7 days, THEN 1 tablet (10 mg total) daily for 23 days. Patient not taking: Reported on 05/03/2018 02/22/18 03/24/18  [place] Callas, NP    Allergies: No Known Allergies  Past Medical History: Past Medical History:  Diagnosis Date  . Colitis   . HIV (human immunodeficiency virus infection) ([place])   . Hypertension     Past Surgical History:  Procedure Laterality Date  . BRAIN SURGERY      Social History: He lives by himself.  Denies any smoking alcohol use or illicit drug use.  Usually independent with daily activities.   Family History:  Family History  Problem Relation Age of Onset  . Ulcerative colitis Mother      Review of Systems - History obtained from the patient General ROS: positive for  - chills and fatigue Psychological ROS: negative Ophthalmic ROS: negative ENT ROS: negative Allergy and Immunology ROS: negative Hematological and Lymphatic ROS: negative Endocrine ROS: negative Breast ROS: as in hpi Respiratory ROS: no cough, shortness of breath, or wheezing Cardiovascular ROS: no chest pain or dyspnea on exertion Gastrointestinal ROS: as in hpi Genito-Urinary ROS: no dysuria, trouble voiding, or hematuria Musculoskeletal ROS: negative Neurological ROS: no TIA or stroke symptoms Dermatological ROS: negative  Physical Examination  Vitals:   05/03/18 1123 05/03/18 1330  BP: (!) 145/100 (!) 138/111  Pulse: (!) 102 86  Resp: 17   Temp: 98.6 F (37 C)   TempSrc: Oral   SpO2: 100% 99%    BP (!) 138/111   Pulse 86   Temp 98.6 F (37 C) (Oral)   Resp 17   SpO2 99%   General appearance: alert, cooperative, appears stated age and no distress Head: Normocephalic, without obvious abnormality, atraumatic Eyes:  conjunctivae/corneas clear. PERRL, EOM's intact.  Throat: lips, mucosa, and tongue normal; teeth and gums normal Neck: no adenopathy, no carotid bruit, no JVD, supple, symmetrical, trachea midline and thyroid not enlarged, symmetric, no tenderness/mass/nodules Resp: clear to auscultation bilaterally Chest wall: Erythema noted around the right nipple area.  Warm to touch.  Tender to palpate.  Opening noted without any active drainage currently. Cardio: regular rate and rhythm, S1, S2 normal, no murmur, click, rub or gallop GI: Abdomen is soft.  Tender diffusely but more so in the lower quadrants left more than right.  No rebound rigidity or guarding.  No masses organomegaly. Extremities: extremities normal, atraumatic, no cyanosis or edema Pulses: 2+ and symmetric Skin: Skin color, texture, turgor normal. No rashes or lesions Lymph nodes: Cervical, supraclavicular, and axillary nodes normal. Neurologic: Alert and oriented x3.  No focal neurological deficits.   Labs on Admission: I have personally reviewed following labs and imaging studies  CBC: Recent Labs  Lab 05/01/18 1331 05/03/18 1238  WBC 14.3* 12.8*  NEUTROABS  --  7.5  HGB 11.6* 10.9*  HCT 38.5* 36.9*  MCV 74.9* 76.1*  PLT 451* 353*   Basic Metabolic Panel: Recent Labs  Lab 05/01/18 1331 05/03/18 1238  NA 135 138  K 4.1 4.0  CL 97* 101  CO2 30 30  GLUCOSE 100* 88  BUN 15 15  CREATININE 1.27* 1.14  CALCIUM 8.9 8.5*   GFR: Estimated Creatinine Clearance: 86.3 mL/min (by C-G formula based on SCr of 1.14 mg/dL). Liver Function Tests: Recent Labs  Lab 05/01/18 1331 05/03/18 1238  AST 21 20  ALT 18 19  ALKPHOS 76 68  BILITOT 0.8 0.3  PROT 7.8 7.4  ALBUMIN 3.3* 3.1*     Radiological Exams on Admission: Ct Abdomen Pelvis W Contrast  Result Date: 05/01/2018 CLINICAL DATA:  Rectal bleeding for 3 weeks. EXAM: CT ABDOMEN AND PELVIS WITH CONTRAST TECHNIQUE: Multidetector CT imaging of the abdomen and pelvis was  performed using the standard protocol following bolus administration of intravenous contrast. CONTRAST:  180m ISOVUE-300 IOPAMIDOL (ISOVUE-300) INJECTION 61% COMPARISON:  CT scan 05/20/2016 FINDINGS: Lower chest: 2.6 x 2.0 cm rim enhancing fluid collection in the right chest wall just below the right nipple worrisome for an abscess. The lung bases are clear. No pleural effusion or pericardial effusion. Hepatobiliary: No focal hepatic lesions or intrahepatic biliary dilatation. The gallbladder is normal. No common bile duct dilatation. Pancreas: No mass, inflammation or ductal dilatation. Spleen: Normal size.  No focal lesions. Adrenals/Urinary Tract: The adrenal glands and kidneys are unremarkable. The bladder is normal. Stomach/Bowel: The stomach, duodenum and small bowel are unremarkable. No inflammatory changes or mass lesions or obstructive findings. The terminal ileum is normal. Low lying cecum deep in the pelvis. The appendix is normal. Suspect a diffuse inflammatory process involving the descending colon, sigmoid colon and rectum with mucosal enhancement and mild submucosal edema. There are also numerous small perirectal lymph nodes along with numerous small lymph nodes in the sigmoid mesocolon. This is very  similar to the patient's prior CT scan from 2018. No mass or obstructive findings. Vascular/Lymphatic: The aorta and branch vessels are normal. The major venous structures are patent. Small scattered mesenteric and retroperitoneal lymph nodes. As described above there are numerous small lymph nodes in the sigmoid mesocolon and perirectal areas consistent with an inflammatory or infectious process. No rectal or perirectal abscess and no perianal abscess. Reproductive: The prostate gland and seminal vesicles are unremarkable. Other: No pelvic mass or adenopathy. No free pelvic fluid collections. No inguinal mass or adenopathy. No abdominal wall hernia or subcutaneous lesions. Musculoskeletal: No  significant bony findings. IMPRESSION: 1. Suspect diffuse inflammatory or infectious colitis involving the rectum and descending colon and sigmoid colon with numerous small perirectal and sigmoid mesocolon lymph nodes, likely inflammatory/hyperplastic. This looks very similar to the patient's prior CT scan. Possible ulcerative colitis. 2. No perirectal abscess or colonic mass. 3. 2.8 x 2.0 cm rim enhancing fluid collection near the right nipple suspicious for abscess. Electronically Signed   By: Marijo Sanes M.D.   On: 05/01/2018 19:49   [place] Chest Soft Tissue  Result Date: 05/01/2018 CLINICAL DATA:  28 year old male with possible infection EXAM: ULTRASOUND OF THE CHEST TECHNIQUE: Ultrasound examination of the head and neck soft tissues was performed in the area of clinical concern. COMPARISON:  None. FINDINGS: Limited grayscale and color duplex images in the region of clinical concern on the right chest wall. Hypoechoic fluid collection measures 3.2 cm x 2.0 cm x 1.8 cm. Marginal color flow. IMPRESSION: Limited ultrasound survey demonstrates fluid collection within the anterior right chest wall soft tissues, compatible with abscess given the history. Electronically Signed   By: Corrie Mckusick D.O.   On: 05/01/2018 18:52      Problem List  Principal Problem:   Ulcerative colitis, acute ([place]) Active Problems:   Acute on chronic blood loss anemia   Human immunodeficiency virus (HIV) disease ([place])   Hematochezia   Assessment: This is 28 year old African-American male with a past medical history as stated earlier who comes in with hematochezia.  Patient with a known history of ulcerative colitis.  CT scan showed diffuse inflammatory colitis involving the rectum, descending colon and sigmoid colon.  No abscess was noted.  Patient also was found to have infection around his right nipple with an abscess formation.  He underwent I&D of this 2 days ago when he was in the ED.  Plan:  1. Exacerbation of  ulcerative colitis: Patient merits hospitalization since he has not improved with outpatient management.  He will be placed on intravenous steroids.  Gastroenterology has been consulted by ED physician.  Dr. Penelope Coop to evaluate this patient.  Stool studies done 2 days ago were unremarkable.  No need to repeat.  Clear liquid diet for now.  2.  Cellulitis involving the right nipple area with abscess: He underwent I&D 2 days ago.  More pus was noted today on examination by the ED provider.  He was placed on doxycycline which will be continued for now.  His WBC is mildly elevated.  He is afebrile.  3. History of HIV disease: Patient supposedly on [place] but has been noncompliant.  He has not taken his medication in a few days as he ran out of them.  This will be resumed.  Patient mentions some numbness in both his feet at times.  He could be experiencing some neuropathy.  He should discuss this further with his ID provider as [place] Odor can cause peripheral neuropathy.  4.  Acute on chronic blood loss anemia: Microcytosis is noted.  Appears to be slightly lower compared to how it was 2 days ago.  Anemia panel will be checked.  Recheck hemoglobin tomorrow.  DVT Prophylaxis: SCDs Code Status: Full code Family Communication: Discussed with the patient Disposition: Hopefully he will be able to return home once he is medically better Consults called: [facility] Admission Status: Inpatient  Severity of Illness: The appropriate patient status for this patient is INPATIENT. Inpatient status is judged to be reasonable and necessary in order to provide the required intensity of service to ensure the patient's safety. The patient's presenting symptoms, physical exam findings, and initial radiographic and laboratory data in the context of their chronic comorbidities is felt to place them at high risk for further clinical deterioration. Furthermore, it is not anticipated that the patient will be medically  stable for discharge from the hospital within 2 midnights of admission. The following factors support the patient status of inpatient.   " The patient's presenting symptoms include blood per rectum. " The worrisome physical exam findings include abdominal tenderness. " The initial radiographic and laboratory data are worrisome because of colitis. " The chronic co-morbidities include HIV.   * I certify that at the point of admission it is my clinical judgment that the patient will require inpatient hospital care spanning beyond 2 midnights from the point of admission due to high intensity of service, high risk for further deterioration and high frequency of surveillance required.*  Further management decisions will depend on results of further testing and patient's response to treatment.   Zion Ta [employer]  [facility] operational officer on [email or web address]  05/03/2018, 2:25 PM

## 2018-05-03 NOTE — Progress Notes (Signed)
Entered patients room to do assessment, and help him order his meal.  When this nurse informed him that he was on a clear liquid diet, he became irate and began cursing " I am not on a liquid diet, I told them downstairs that I have not eat in 3 days and they told me I could have whatever I wanted, and if I dont get some food it is not going to be a pretty situation, you can give me my clothes and Ill leave right now."  This nurse informed him that I would page the MD and see if we could change his diet but for right now he could only have clear liquids.  He began yelling " I can get my own food brought to me, I dont need nothing from you, you rude as fuck anyway and I dont want you to be my motherfucking nurse anyway, get the fuck out."  Reported to department director and [facility]

## 2018-05-03 NOTE — Consult Note (Signed)
Subjective:   HPI  The patient is a 28 year old male with a history of HIV and a history of ulcerative colitis. He states he found out about HIV 4 years ago and found out about ulcerative colitis when he was 28 years old. He was diagnosed with ulcerative colitis at age 15 by colonoscopy. He states that the all should've colitis was all around his colon. He was on  Apriso and Imuran for the treatment of ulcerative colitis for years but has not been on this medication for the past 3 years. He has not been on a treatment for ulcerative colitis in 3 years. He states he had a colonoscopy again in about 3 years ago and again was told he had colitis and was told at that time to consider Remicade but he never followed through with that. All of this evaluation was done in [place]. He has since moved to [place]. Over the past 2 weeks he has been experiencing diarrhea and rectal bleeding and abdominal cramping. He came to the emergency room a couple of days ago with similar complaints and had a CT scan suggesting colitis. C. Difficile was negative and GI pathogens panel also negative. He was sent home on a tapering dose of steroids but did not get better. He returns to the ER today and is being admitted.  He states he is being followed by infectious disease here at [facility].  Review of Systems No chest pain or shortness of breath  Past Medical History:  Diagnosis Date  . Colitis   . HIV (human immunodeficiency virus infection) ([place])   . Hypertension    Past Surgical History:  Procedure Laterality Date  . BRAIN SURGERY     Social History   Socioeconomic History  . Marital status: Single    Spouse name: Not on file  . Number of children: Not on file  . Years of education: Not on file  . Highest education level: Not on file  Occupational History  . Not on file  Social Needs  . Financial resource strain: Not on file  . Food insecurity:    Worry: Not on file    Inability: Not on file  .  Transportation needs:    Medical: Not on file    Non-medical: Not on file  Tobacco Use  . Smoking status: Never Smoker  . Smokeless tobacco: Never Used  Substance and Sexual Activity  . Alcohol use: No  . Drug use: Yes    Types: Marijuana, MDMA (Ecstacy)  . Sexual activity: Yes    Partners: Male    Comment: declined comdoms  Lifestyle  . Physical activity:    Days per week: Not on file    Minutes per session: Not on file  . Stress: Not on file  Relationships  . Social connections:    Talks on phone: Not on file    Gets together: Not on file    Attends religious service: Not on file    Active member of club or organization: Not on file    Attends meetings of clubs or organizations: Not on file    Relationship status: Not on file  . Intimate partner violence:    Fear of current or ex partner: Not on file    Emotionally abused: Not on file    Physically abused: Not on file    Forced sexual activity: Not on file  Other Topics Concern  . Not on file  Social History Narrative  . Not on file  family history includes Ulcerative colitis in his mother. No current facility-administered medications for this encounter.   Current Outpatient Medications:  .  acetaminophen (TYLENOL) 500 MG tablet, Take 1,000 mg by mouth 2 (two) times daily as needed for moderate pain., Disp: , Rfl:  .  bictegravir-emtricitabine-tenofovir AF (BIKTARVY) 50-200-25 MG TABS tablet, Take 1 tablet by mouth daily., Disp: 30 tablet, Rfl: 5 .  escitalopram (LEXAPRO) 10 MG tablet, Take 0.5 tablets (5 mg total) by mouth daily for 7 days, THEN 1 tablet (10 mg total) daily for 23 days. (Patient not taking: Reported on 05/03/2018), Disp: 28 tablet, Rfl: 0 No Known Allergies   Objective:     BP (!) 155/107   Pulse 82   Temp 98.6 F (37 C) (Oral)   Resp 17   SpO2 98%   No acute distress  Heart regular rhythm no murmurs  Lungs clear  Abdomen: Bowel sounds present, soft, nontender  Laboratory No  components found for: D1    Assessment:     History of ulcerative colitis, with current flareup of ulcerative colitis  History of HIV      Plan:     The patient is being admitted to the hospital. Short-term to try to help get his colitis symptoms under control, I think that beginning IV Solu-Medrol would be an appropriate thing to do. He wants to get back on Apriso and Imuran. We will have to check with infectious disease to see if Imuran would be okay in the face of HIV. Also going forward we would have to check with infectious disease in the event he might need a biologic such as Remicade. Lab Results  Component Value Date   HGB 10.9 (L) 05/03/2018   HGB 11.6 (L) 05/01/2018   HGB 14.5 02/22/2018   HCT 36.9 (L) 05/03/2018   HCT 38.5 (L) 05/01/2018   HCT 46.0 02/22/2018   ALKPHOS 68 05/03/2018   ALKPHOS 76 05/01/2018   ALKPHOS 70 10/21/2016   AST 20 05/03/2018   AST 21 05/01/2018   AST 26 02/22/2018   ALT 19 05/03/2018   ALT 18 05/01/2018   ALT 19 02/22/2018

## 2018-05-03 NOTE — ED Provider Notes (Signed)
[facility] DEPT Provider Note   CSN: [id number] Arrival date & time: 05/03/18  1054    History   Chief Complaint Chief Complaint  Patient presents with  . rectal bleeding    HPI Larry Lutz is a 28 y.o. male.     28yo M w/ PMH including HIV, ulcerative colitis who p/w rectal bleeding and drainage from abscess. Pt was in ED yesterday and had a R nipple abscess incised and drained. He has continued to have foul smelling drainage and pain in this area. He also reports continued rectal bleeding with BRBPR sometimes mixed with diarrhea. He reports associated generalized, constant, moderate to severe abdominal pain.  He reports nausea but no vomiting.  He has trouble sometimes initiating urinary stream but denies any pain with urination.  He was given a steroid course by his ID specialist a few weeks ago which he completed but it did not improve his symptoms.  When he was evaluated in the ED last night, he was recommended for admission due to his ongoing UC abdomens but he wanted to go home.  He returns today stating that he is not able to tolerate the symptoms anymore. He is not established with GI. No fevers.  The history is provided by the patient.    Past Medical History:  Diagnosis Date  . Colitis   . HIV (human immunodeficiency virus infection) ([place])   . Hypertension     Patient Active Problem List   Diagnosis Date Noted  . Syphilis contact, untreated 11/10/2017  . Neuropathy of right hand 05/11/2017  . Elevated cholesterol 02/25/2017  . High blood pressure 01/27/2017  . Human immunodeficiency virus (HIV) disease ([place]) 01/05/2017  . Depression with anxiety   . HIV antibody positive ([place]) 06/02/2016  . Ulcerative colitis, acute ([place]) 06/01/2016  . Gastrointestinal hemorrhage 06/01/2016    Past Surgical History:  Procedure Laterality Date  . BRAIN SURGERY          Home Medications    Prior to Admission medications   Medication Sig  Start Date End Date Taking? Authorizing Provider  bictegravir-emtricitabine-tenofovir AF (BIKTARVY) 50-200-25 MG TABS tablet Take 1 tablet by mouth daily. 11/28/17   Kuppelweiser, Cassie L, RPH-CPP  escitalopram (LEXAPRO) 10 MG tablet Take 0.5 tablets (5 mg total) by mouth daily for 7 days, THEN 1 tablet (10 mg total) daily for 23 days. 02/22/18 03/24/18  [place] Callas, NP  HYDROcodone-acetaminophen (NORCO/VICODIN) 5-325 MG tablet Take 1 tablet by mouth every 6 (six) hours as needed for severe pain. 05/01/18   Etta Quill, NP  hydrOXYzine (ATARAX/VISTARIL) 25 MG tablet Take 1 tablet (25 mg total) by mouth every 6 (six) hours as needed for anxiety. Patient not taking: Reported on 01/05/2017 10/23/16   Mordecai Maes, NP  mirtazapine (REMERON) 15 MG tablet Take 1 tablet (15 mg total) at bedtime by mouth. Patient not taking: Reported on 05/11/2017 01/27/17   [place] Callas, NP  ondansetron (ZOFRAN-ODT) 4 MG disintegrating tablet Take 1 tablet (4 mg total) by mouth every 8 (eight) hours as needed for nausea or vomiting. Patient not taking: Reported on 05/01/2018 02/22/18   [place] Callas, NP  predniSONE (DELTASONE) 20 MG tablet 2 tabs with food daily x 14 days. THEN 1 tab daily for 4 days. THEN 1/2 tab daily until gone Patient taking differently: Take 40 mg by mouth daily.  03/27/18   [place] Callas, NP  predniSONE (DELTASONE) 20 MG tablet 2 tablets po daily x 7 days  05/01/18   Etta Quill, NP  traZODone (DESYREL) 50 MG tablet Take 1 tablet (50 mg total) by mouth at bedtime as needed for sleep. Patient not taking: Reported on 01/05/2017 10/23/16   Mordecai Maes, NP    Family History Family History  Problem Relation Age of Onset  . Ulcerative colitis Mother     Social History Social History   Tobacco Use  . Smoking status: Never Smoker  . Smokeless tobacco: Never Used  Substance Use Topics  . Alcohol use: No  . Drug use: Yes    Types: Marijuana, MDMA (Ecstacy)      Allergies   Patient has no known allergies.   Review of Systems Review of Systems All other systems reviewed and are negative except that which was mentioned in HPI   Physical Exam Updated Vital Signs BP (!) 145/100 (BP Location: Right Arm)   Pulse (!) 102   Temp 98.6 F (37 C) (Oral)   Resp 17   SpO2 100%   Physical Exam Vitals signs and nursing note reviewed.  Constitutional:      General: He is not in acute distress.    Appearance: He is well-developed.  HENT:     Head: Normocephalic and atraumatic.     Mouth/Throat:     Mouth: Mucous membranes are moist.     Pharynx: Oropharynx is clear.  Eyes:     Conjunctiva/sclera: Conjunctivae normal.  Neck:     Musculoskeletal: Neck supple.  Cardiovascular:     Rate and Rhythm: Normal rate and regular rhythm.     Heart sounds: Normal heart sounds. No murmur.  Pulmonary:     Effort: Pulmonary effort is normal.     Breath sounds: Normal breath sounds.  Abdominal:     General: Bowel sounds are increased. There is no distension.     Palpations: Abdomen is soft.     Tenderness: There is generalized abdominal tenderness.  Skin:    General: Skin is warm and dry.     Comments: Mild edema of R nipple with incision lateral to nipple, copious purulent drainage  Neurological:     Mental Status: He is alert and oriented to person, place, and time.     Comments: Fluent speech  Psychiatric:        Judgment: Judgment normal.      ED Treatments / Results  Labs (all labs ordered are listed, but only abnormal results are displayed) Labs Reviewed  GASTROINTESTINAL PANEL BY PCR, STOOL (REPLACES STOOL CULTURE)  COMPREHENSIVE METABOLIC PANEL  CBC WITH DIFFERENTIAL/PLATELET  LACTIC ACID, PLASMA  LACTIC ACID, PLASMA  SEDIMENTATION RATE  C-REACTIVE PROTEIN  TYPE AND SCREEN    EKG None  Radiology Ct Abdomen Pelvis W Contrast  Result Date: 05/01/2018 CLINICAL DATA:  Rectal bleeding for 3 weeks. EXAM: CT ABDOMEN AND  PELVIS WITH CONTRAST TECHNIQUE: Multidetector CT imaging of the abdomen and pelvis was performed using the standard protocol following bolus administration of intravenous contrast. CONTRAST:  154m ISOVUE-300 IOPAMIDOL (ISOVUE-300) INJECTION 61% COMPARISON:  CT scan 05/20/2016 FINDINGS: Lower chest: 2.6 x 2.0 cm rim enhancing fluid collection in the right chest wall just below the right nipple worrisome for an abscess. The lung bases are clear. No pleural effusion or pericardial effusion. Hepatobiliary: No focal hepatic lesions or intrahepatic biliary dilatation. The gallbladder is normal. No common bile duct dilatation. Pancreas: No mass, inflammation or ductal dilatation. Spleen: Normal size.  No focal lesions. Adrenals/Urinary Tract: The adrenal glands and kidneys are unremarkable. The bladder  is normal. Stomach/Bowel: The stomach, duodenum and small bowel are unremarkable. No inflammatory changes or mass lesions or obstructive findings. The terminal ileum is normal. Low lying cecum deep in the pelvis. The appendix is normal. Suspect a diffuse inflammatory process involving the descending colon, sigmoid colon and rectum with mucosal enhancement and mild submucosal edema. There are also numerous small perirectal lymph nodes along with numerous small lymph nodes in the sigmoid mesocolon. This is very similar to the patient's prior CT scan from 2018. No mass or obstructive findings. Vascular/Lymphatic: The aorta and branch vessels are normal. The major venous structures are patent. Small scattered mesenteric and retroperitoneal lymph nodes. As described above there are numerous small lymph nodes in the sigmoid mesocolon and perirectal areas consistent with an inflammatory or infectious process. No rectal or perirectal abscess and no perianal abscess. Reproductive: The prostate gland and seminal vesicles are unremarkable. Other: No pelvic mass or adenopathy. No free pelvic fluid collections. No inguinal mass or  adenopathy. No abdominal wall hernia or subcutaneous lesions. Musculoskeletal: No significant bony findings. IMPRESSION: 1. Suspect diffuse inflammatory or infectious colitis involving the rectum and descending colon and sigmoid colon with numerous small perirectal and sigmoid mesocolon lymph nodes, likely inflammatory/hyperplastic. This looks very similar to the patient's prior CT scan. Possible ulcerative colitis. 2. No perirectal abscess or colonic mass. 3. 2.8 x 2.0 cm rim enhancing fluid collection near the right nipple suspicious for abscess. Electronically Signed   By: Marijo Sanes M.D.   On: 05/01/2018 19:49   [place] Chest Soft Tissue  Result Date: 05/01/2018 CLINICAL DATA:  28 year old male with possible infection EXAM: ULTRASOUND OF THE CHEST TECHNIQUE: Ultrasound examination of the head and neck soft tissues was performed in the area of clinical concern. COMPARISON:  None. FINDINGS: Limited grayscale and color duplex images in the region of clinical concern on the right chest wall. Hypoechoic fluid collection measures 3.2 cm x 2.0 cm x 1.8 cm. Marginal color flow. IMPRESSION: Limited ultrasound survey demonstrates fluid collection within the anterior right chest wall soft tissues, compatible with abscess given the history. Electronically Signed   By: Corrie Mckusick D.O.   On: 05/01/2018 18:52    Procedures Procedures (including critical care time)  Medications Ordered in ED Medications  lactated ringers bolus 1,000 mL (0 mLs Intravenous Stopped 05/03/18 1401)  doxycycline (VIBRA-TABS) tablet 100 mg (100 mg Oral Given 05/03/18 1311)  methylPREDNISolone sodium succinate (SOLU-MEDROL) 125 mg/2 mL injection 125 mg (125 mg Intravenous Given 05/03/18 1312)  morphine 4 MG/ML injection 4 mg (4 mg Intravenous Given 05/03/18 1312)     Initial Impression / Assessment and Plan / ED Course  I have reviewed the triage vital signs and the nursing notes.  Pertinent labs & imaging results that were available  during my care of the patient were reviewed by me and considered in my medical decision making (see chart for details).       Non-toxic on exam, hypertensive, afebrile. Generalized abd tenderness. Has pus draining from wound. Given underlying HIV, will start on abx for infection.  Regarding ongoing ulcerative colitis flare, reviewed CT scan from yesterday and discussed presentation with gastroenterology at [facility], Dr. Penelope Coop. He will see pt in consultation and agrees w/ plan for IV steroids.  Discussed admission with [facility], Dr. Maryland Pink, and pt admitted for further care.  Final Clinical Impressions(s) / ED Diagnoses   Final diagnoses:  Rectal bleeding  Ulcerative colitis with rectal bleeding, unspecified location [facility])  Abscess of right breast  ED Discharge Orders    None       Jolita Haefner, Wenda Overland, MD 05/03/18 1414

## 2018-05-03 NOTE — ED Triage Notes (Signed)
Pt reports that he was discharged yesterday and was told to come back if rectal bleeding continued. Pt reports that still having frequent loose bright red blood bleeding.  Pt reports that "nipple he cut open smells so bad".

## 2018-05-04 LAB — IRON AND TIBC
Iron: 25 ug/dL — ABNORMAL LOW (ref 45–182)
Saturation Ratios: 8 % — ABNORMAL LOW (ref 17.9–39.5)
TIBC: 328 ug/dL (ref 250–450)
UIBC: 303 ug/dL

## 2018-05-04 LAB — COMPREHENSIVE METABOLIC PANEL
ALT: 21 U/L (ref 0–44)
AST: 20 U/L (ref 15–41)
Albumin: 3.2 g/dL — ABNORMAL LOW (ref 3.5–5.0)
Alkaline Phosphatase: 71 U/L (ref 38–126)
Anion gap: 8 (ref 5–15)
BUN: 17 mg/dL (ref 6–20)
CO2: 28 mmol/L (ref 22–32)
Calcium: 9.1 mg/dL (ref 8.9–10.3)
Chloride: 100 mmol/L (ref 98–111)
Creatinine, Ser: 1.03 mg/dL (ref 0.61–1.24)
GFR calc non Af Amer: >60 mL/min (ref >60)
Glucose, Bld: 117 mg/dL — ABNORMAL HIGH (ref 70–99)
Potassium: 4.4 mmol/L (ref 3.5–5.1)
SODIUM: 136 mmol/L (ref 135–145)
Total Bilirubin: 0.5 mg/dL (ref 0.3–1.2)
Total Protein: 7.9 g/dL (ref 6.5–8.1)

## 2018-05-04 LAB — CBC
HCT: 38.2 % — ABNORMAL LOW (ref 39.0–52.0)
Hemoglobin: 11.3 g/dL — ABNORMAL LOW (ref 13.0–17.0)
MCH: 22.5 pg — ABNORMAL LOW (ref 26.0–34.0)
MCHC: 29.6 g/dL — ABNORMAL LOW (ref 30.0–36.0)
MCV: 75.9 fL — ABNORMAL LOW (ref 80.0–100.0)
NRBC: 0 % (ref 0.0–0.2)
Platelets: 490 10*3/uL — ABNORMAL HIGH (ref 150–400)
RBC: 5.03 MIL/uL (ref 4.22–5.81)
RDW: 15.1 % (ref 11.5–15.5)
WBC: 16.9 10*3/uL — ABNORMAL HIGH (ref 4.0–10.5)

## 2018-05-04 LAB — RETICULOCYTES
Immature Retic Fract: 25.2 % — ABNORMAL HIGH (ref 2.3–15.9)
RBC.: 5.03 MIL/uL (ref 4.22–5.81)
RETIC COUNT ABSOLUTE: 52.3 10*3/uL (ref 19.0–186.0)
Retic Ct Pct: 1 % (ref 0.4–3.1)

## 2018-05-04 LAB — TSH: TSH: 0.285 u[IU]/mL — ABNORMAL LOW (ref 0.350–4.500)

## 2018-05-04 LAB — VITAMIN B12: Vitamin B-12: 303 pg/mL (ref 180–914)

## 2018-05-04 LAB — FERRITIN: Ferritin: 94 ng/mL (ref 24–336)

## 2018-05-04 LAB — FOLATE: Folate: 7.9 ng/mL (ref >5.9)

## 2018-05-04 MED ORDER — MESALAMINE 1.2 G PO TBEC: 4.8000 g | DELAYED_RELEASE_TABLET | Freq: Every day | ORAL | 0 refills | Status: DC

## 2018-05-04 MED ORDER — DIPHENHYDRAMINE HCL 25 MG PO CAPS
25.0000 mg | ORAL_CAPSULE | Freq: Once | ORAL | Status: AC
  Administered 2018-05-04: 25 mg via ORAL

## 2018-05-04 MED ORDER — PREDNISONE 10 MG PO TABS: ORAL_TABLET | ORAL | 0 refills | Status: DC

## 2018-05-04 MED ORDER — DIPHENHYDRAMINE HCL 25 MG PO CAPS
ORAL_CAPSULE | ORAL | Status: AC
  Administered 2018-05-04: 25 mg
  Filled 2018-05-04: qty 1

## 2018-05-04 MED ORDER — DOXYCYCLINE HYCLATE 100 MG PO TABS: 100.0000 mg | ORAL_TABLET | Freq: Two times a day (BID) | ORAL | 0 refills | Status: AC

## 2018-05-04 NOTE — Discharge Summary (Signed)
Physician Discharge Summary  Patient ID: Larry Lutz MRN: [id number] DOB/AGE: 06/30/1990 27 y.o.  Admit date: 05/03/2018 Discharge date: 05/04/2018  Admission Diagnoses:  Discharge Diagnoses:  Principal Problem:   Ulcerative colitis, acute ([place]) Active Problems:   Acute on chronic blood loss anemia   Human immunodeficiency virus (HIV) disease ([place])   Hematochezia   Discharged Condition: stable  Hospital Course:  Larry Lutz is a 28 year old African-American male, with past medical history significant for ulcerative colitis and HIV.  Patient is not compliant with the antiretroviral treatment. According to the patient, he ran out of his medications a few days ago and has not refilled them.    Patient presented with 21-monthhistory of bloody stool, with associated 15 pound weight loss.  Apparently, patient was seen in the emergency room 2 days ago with same symptoms and was discharged back home.  Recent CT scan done revealed colitis.  C. difficile came back negative as well as GI pathogen panel.  Patient was admitted and managed for flare of ulcerative colitis.  Patient was managed with IV steroids as well as mesalamine.  Patient's symptoms have improved.  GI team is cleared patient for discharge.  Patient will be discharged on tapering dose of steroids.  Significantly, during the hospital stay, patient was reported to be rude to the nursing staff.  Patient did what he wanted, including, taking a shower without approval.  Cellulitis involving the right nipple area with abscess:  Patient underwent I&D 2 days prior to presentation.   Patient is currently on doxycycline.   Patient will need to follow with a primary care provider times resolution.   History of HIV disease:  [place] has been [place]compl[place].   He has not taken his medication in a few days as he ran out of them.    Acute on chronic blood loss anemia:   Consults: GI  Significant Diagnostic Studies:     Discharge Exam: Blood pressure (!) 151/101, pulse (!) 58, temperature 98.1 F (36.7 C), temperature source Oral, resp. rate 18, SpO2 100 %.  Disposition: Discharge disposition: 01-Home or Self Care  Discharge Instructions    Diet - low sodium heart healthy   Complete by:  As directed    Increase activity slowly   Complete by:  As directed      Allergies as of 05/04/2018   No Known Allergies     Medication List    STOP taking these medications   acetaminophen 500 MG tablet Commonly known as:  TYLENOL   escitalopram 10 MG tablet Commonly known as:  LEXAPRO     TAKE these medications   bictegravir-emtricitabine-tenofovir AF 50-200-25 MG Tabs tablet Commonly known as:  BIKTARVY Take 1 tablet by mouth daily.   doxycycline 100 MG tablet Commonly known as:  VIBRA-TABS Take 1 tablet (100 mg total) by mouth every 12 (twelve) hours for 7 days.   mesalamine 1.2 g EC tablet Commonly known as:  LIALDA Take 4 tablets (4.8 g total) by mouth daily with breakfast for 30 days. Start taking on:  May 05, 2018   predniSONE 10 MG tablet Commonly known as:  DELTASONE Prednisone 60 mg po daily for 7 days, then 40 mg po daily for 7 days, then decrease by 10 mg every 7 days.        Signed:Bonnell Public2/20/2020, 12:57 PM

## 2018-05-04 NOTE — Progress Notes (Signed)
Patient continues to be Larry Lutz with staff regarding standard protocols such as use of bed alarm, bedside reporting.  According to staff patient gets very agitated and swears at staff.  Patient because loud with Dr Marthenia Rolling at bedside, MD requested security presence. Dr Marthenia Rolling  attempted speak with patient at bedside regarding plan of care and working together as a team for his healthcare needs.  Patient proceeded to become more agitated, asked for another MD to take over his care, and asked to be moved off of department [id number].  We will honor the patient's wishes, as we are unable to speak with him in a reasonable manner.

## 2018-05-04 NOTE — Progress Notes (Signed)
NT notify writer that patient wanted to take his shower and have his IV wrapped. Writer notify NT that Probation officer will go in Patients room as soon as Probation officer was finished with a patient she was with. NT called writer back and notified that patient took his own IV out. NT also asked writer if she could put new bedding on patient's room. Writer went to patient's room. Patient in the shower. Writer made patient's bed. Dr. Marthenia Rolling came to speak to the patient. Writer notified patient. He stated" Give me two minutes. Dr. Marthenia Rolling waited for the patient. After several minutes writer went back in the patient's room and notified patient Dr. Marthenia Rolling is waiting. Patient stated, " Give me a minute". Patient came out of the bathroom had a towel covering his private and told the Dr to come. Security was called. Dr. Marthenia Rolling, Probation officer, Scientist, research (medical) went in patient room. Patient was very disrespectful and wants a another Dr. He also requested to me moved to another floor.

## 2018-05-04 NOTE — Progress Notes (Signed)
Discharged reviewed with patient.  No questions asked.  Patient voices feeling better at this time with events that occurred earlier today.

## 2018-05-04 NOTE — Progress Notes (Signed)
[facility] Progress Note  Subjective: The patient states he is still having diarrhea and rectal bleeding. States medicines have not helped. We have started him on steroids as well as mesalamine. Complains about the care he is receiving. Complains about the staff. States he doesn't like it here.  Objective: Vital signs in last 24 hours: Temp:  [97.7 F (36.5 C)-98.1 F (36.7 C)] 98.1 F (36.7 C) (02/20 0531) Pulse Rate:  [58-90] 58 (02/20 0531) Resp:  [18] 18 (02/20 0531) BP: (123-155)/(74-111) 151/101 (02/20 0531) SpO2:  [97 %-100 %] 100 % (02/20 0531) Weight change:    PE:  No distress. He is up in the room ready to take a shower.  Lab Results: Results for orders placed or performed during the hospital encounter of 05/03/18 (from the past 24 hour(s))  Lactic acid, plasma     Status: None   Collection Time: 05/03/18 11:45 AM  Result Value Ref Range   Lactic Acid, Venous 1.0 0.5 - 1.9 mmol/L  Type and screen [place]     Status: None   Collection Time: 05/03/18 12:30 PM  Result Value Ref Range   ABO/RH(D) A POS    Antibody Screen NEG    Sample Expiration      05/06/2018 Performed at [facility], [place] [address] [place], [place] [address]   Comprehensive metabolic panel     Status: Abnormal   Collection Time: 05/03/18 12:38 PM  Result Value Ref Range   Sodium 138 135 - 145 mmol/L   Potassium 4.0 3.5 - 5.1 mmol/L   Chloride 101 98 - 111 mmol/L   CO2 30 22 - 32 mmol/L   Glucose, Bld 88 70 - 99 mg/dL   BUN 15 6 - 20 mg/dL   Creatinine, Ser 1.14 0.61 - 1.24 mg/dL   Calcium 8.5 (L) 8.9 - 10.3 mg/dL   Total Protein 7.4 6.5 - 8.1 g/dL   Albumin 3.1 (L) 3.5 - 5.0 g/dL   AST 20 15 - 41 U/L   ALT 19 0 - 44 U/L   Alkaline Phosphatase 68 38 - 126 U/L   Total Bilirubin 0.3 0.3 - 1.2 mg/dL   GFR calc non Af Amer >60 >60 mL/min   GFR calc Af Amer >60 >60 mL/min   Anion gap 7 5 - 15  CBC with Differential     Status:  Abnormal   Collection Time: 05/03/18 12:38 PM  Result Value Ref Range   WBC 12.8 (H) 4.0 - 10.5 K/uL   RBC 4.85 4.22 - 5.81 MIL/uL   Hemoglobin 10.9 (L) 13.0 - 17.0 g/dL   HCT 36.9 (L) 39.0 - 52.0 %   MCV 76.1 (L) 80.0 - 100.0 fL   MCH 22.5 (L) 26.0 - 34.0 pg   MCHC 29.5 (L) 30.0 - 36.0 g/dL   RDW 15.2 11.5 - 15.5 %   Platelets 404 (H) 150 - 400 K/uL   nRBC 0.0 0.0 - 0.2 %   Neutrophils Relative % 58 %   Neutro Abs 7.5 1.7 - 7.7 K/uL   Lymphocytes Relative 23 %   Lymphs Abs 2.9 0.7 - 4.0 K/uL   Monocytes Relative 16 %   Monocytes Absolute 2.0 (H) 0.1 - 1.0 K/uL   Eosinophils Relative 1 %   Eosinophils Absolute 0.1 0.0 - 0.5 K/uL   Basophils Relative 0 %   Basophils Absolute 0.0 0.0 - 0.1 K/uL   Immature Granulocytes 2 %   Abs Immature Granulocytes 0.25 (  H) 0.00 - 0.07 K/uL  Sedimentation rate     Status: Abnormal   Collection Time: 05/03/18 12:38 PM  Result Value Ref Range   Sed Rate 65 (H) 0 - 16 mm/hr  C-reactive protein     Status: Abnormal   Collection Time: 05/03/18 12:39 PM  Result Value Ref Range   CRP 7.5 (H) <1.0 mg/dL  Urinalysis, Routine w reflex microscopic- may I&O cath if menses     Status: Abnormal   Collection Time: 05/03/18  1:25 PM  Result Value Ref Range   Color, Urine YELLOW YELLOW   APPearance CLEAR CLEAR   Specific Gravity, Urine 1.011 1.005 - 1.030   pH 8.0 5.0 - 8.0   Glucose, UA NEGATIVE NEGATIVE mg/dL   Hgb urine dipstick SMALL (A) NEGATIVE   Bilirubin Urine NEGATIVE NEGATIVE   Ketones, ur NEGATIVE NEGATIVE mg/dL   Protein, ur NEGATIVE NEGATIVE mg/dL   Nitrite NEGATIVE NEGATIVE   Leukocytes,Ua NEGATIVE NEGATIVE   RBC / HPF 0-5 0 - 5 RBC/hpf   WBC, UA 0-5 0 - 5 WBC/hpf   Bacteria, UA NONE SEEN NONE SEEN   Mucus PRESENT   Comprehensive metabolic panel     Status: Abnormal   Collection Time: 05/04/18  5:25 AM  Result Value Ref Range   Sodium 136 135 - 145 mmol/L   Potassium 4.4 3.5 - 5.1 mmol/L   Chloride 100 98 - 111 mmol/L   CO2 28 22  - 32 mmol/L   Glucose, Bld 117 (H) 70 - 99 mg/dL   BUN 17 6 - 20 mg/dL   Creatinine, Ser 1.03 0.61 - 1.24 mg/dL   Calcium 9.1 8.9 - 10.3 mg/dL   Total Protein 7.9 6.5 - 8.1 g/dL   Albumin 3.2 (L) 3.5 - 5.0 g/dL   AST 20 15 - 41 U/L   ALT 21 0 - 44 U/L   Alkaline Phosphatase 71 38 - 126 U/L   Total Bilirubin 0.5 0.3 - 1.2 mg/dL   GFR calc non Af Amer >60 >60 mL/min   GFR calc Af Amer >60 >60 mL/min   Anion gap 8 5 - 15  CBC     Status: Abnormal   Collection Time: 05/04/18  5:25 AM  Result Value Ref Range   WBC 16.9 (H) 4.0 - 10.5 K/uL   RBC 5.03 4.22 - 5.81 MIL/uL   Hemoglobin 11.3 (L) 13.0 - 17.0 g/dL   HCT 38.2 (L) 39.0 - 52.0 %   MCV 75.9 (L) 80.0 - 100.0 fL   MCH 22.5 (L) 26.0 - 34.0 pg   MCHC 29.6 (L) 30.0 - 36.0 g/dL   RDW 15.1 11.5 - 15.5 %   Platelets 490 (H) 150 - 400 K/uL   nRBC 0.0 0.0 - 0.2 %  TSH     Status: Abnormal   Collection Time: 05/04/18  5:25 AM  Result Value Ref Range   TSH 0.285 (L) 0.350 - 4.500 uIU/mL  Vitamin B12     Status: None   Collection Time: 05/04/18  5:25 AM  Result Value Ref Range   Vitamin B-12 303 180 - 914 pg/mL  Folate     Status: None   Collection Time: 05/04/18  5:25 AM  Result Value Ref Range   Folate 7.9 >5.9 ng/mL  Iron and TIBC     Status: Abnormal   Collection Time: 05/04/18  5:25 AM  Result Value Ref Range   Iron 25 (L) 45 - 182 ug/dL   TIBC 328  250 - 450 ug/dL   Saturation Ratios 8 (L) 17.9 - 39.5 %   UIBC 303 ug/dL  Ferritin     Status: None   Collection Time: 05/04/18  5:25 AM  Result Value Ref Range   Ferritin 94 24 - 336 ng/mL  Reticulocytes     Status: Abnormal   Collection Time: 05/04/18  5:25 AM  Result Value Ref Range   Retic Ct Pct 1.0 0.4 - 3.1 %   RBC. 5.03 4.22 - 5.81 MIL/uL   Retic Count, Absolute 52.3 19.0 - 186.0 K/uL   Immature Retic Fract 25.2 (H) 2.3 - 15.9 %    Studies/Results: No results found.    Assessment: Ulcerative colitis  Plan:   Continue current regimen.    Cassell Clement 05/04/2018, 11:36 AM  Pager: [phone] If no answer or after 5 PM call [phone]

## 2018-05-05 ENCOUNTER — Other Ambulatory Visit: Payer: Self-pay

## 2018-05-05 ENCOUNTER — Emergency Department (HOSPITAL_COMMUNITY)
Admission: EM | Admit: 2018-05-05 | Discharge: 2018-05-05 | Disposition: A | Discharge status: Home / Self Care | Payer: Self-pay | Attending: Emergency Medicine | Admitting: Emergency Medicine

## 2018-05-05 DIAGNOSIS — Z79899 Other long term (current) drug therapy: Secondary | ICD-10-CM | POA: Insufficient documentation

## 2018-05-05 DIAGNOSIS — K51918 Ulcerative colitis, unspecified with other complication: Secondary | ICD-10-CM

## 2018-05-05 DIAGNOSIS — F329 Major depressive disorder, single episode, unspecified: Secondary | ICD-10-CM | POA: Insufficient documentation

## 2018-05-05 DIAGNOSIS — Z21 Asymptomatic human immunodeficiency virus [HIV] infection status: Secondary | ICD-10-CM | POA: Insufficient documentation

## 2018-05-05 DIAGNOSIS — F419 Anxiety disorder, unspecified: Secondary | ICD-10-CM | POA: Insufficient documentation

## 2018-05-05 DIAGNOSIS — I10 Essential (primary) hypertension: Secondary | ICD-10-CM | POA: Insufficient documentation

## 2018-05-05 DIAGNOSIS — K51911 Ulcerative colitis, unspecified with rectal bleeding: Secondary | ICD-10-CM | POA: Insufficient documentation

## 2018-05-05 DIAGNOSIS — K625 Hemorrhage of anus and rectum: Secondary | ICD-10-CM

## 2018-05-05 LAB — BASIC METABOLIC PANEL WITH GFR
Anion gap: 8 (ref 5–15)
BUN: 14 mg/dL (ref 6–20)
CO2: 28 mmol/L (ref 22–32)
Calcium: 8.6 mg/dL — ABNORMAL LOW (ref 8.9–10.3)
Chloride: 101 mmol/L (ref 98–111)
Creatinine, Ser: 1.23 mg/dL (ref 0.61–1.24)
GFR calc Af Amer: >60 mL/min
GFR calc non Af Amer: >60 mL/min
Glucose, Bld: 112 mg/dL — ABNORMAL HIGH (ref 70–99)
Potassium: 3.6 mmol/L (ref 3.5–5.1)
Sodium: 137 mmol/L (ref 135–145)

## 2018-05-05 LAB — CBC
HCT: 36 % — ABNORMAL LOW (ref 39.0–52.0)
Hemoglobin: 10.5 g/dL — ABNORMAL LOW (ref 13.0–17.0)
MCH: 21.7 pg — ABNORMAL LOW (ref 26.0–34.0)
MCHC: 29.2 g/dL — ABNORMAL LOW (ref 30.0–36.0)
MCV: 74.5 fL — ABNORMAL LOW (ref 80.0–100.0)
Platelets: 547 K/uL — ABNORMAL HIGH (ref 150–400)
RBC: 4.83 MIL/uL (ref 4.22–5.81)
RDW: 15 % (ref 11.5–15.5)
WBC: 15.3 K/uL — ABNORMAL HIGH (ref 4.0–10.5)
nRBC: 0.1 % (ref 0.0–0.2)

## 2018-05-05 LAB — PROTIME-INR
INR: 1.13
Prothrombin Time: 14.4 seconds (ref 11.4–15.2)

## 2018-05-05 LAB — TYPE AND SCREEN
ABO/RH(D): A POS
Antibody Screen: NEGATIVE

## 2018-05-05 NOTE — ED Notes (Signed)
Pt had loose bloody bowel movement. Pt was cleaned and had new chucks placed under him

## 2018-05-05 NOTE — Discharge Instructions (Addendum)
Please return to the ER tomorrow morning for repeat hemoglobin/evaluation  Call you GI team for follow up  Continue your steroids

## 2018-05-05 NOTE — ED Triage Notes (Signed)
Pt reports that he has had rectal bleeding with pain going on for about one month. Pt reports that he was recently seen at [facility] long earlier in the week and was told to come back if it worsened. Pt reports that he has had some chest pain for the last two days on his right side as well. Pt reports bloody stools this morning.

## 2018-05-05 NOTE — ED Provider Notes (Addendum)
[facility] EMERGENCY DEPARTMENT Provider Note   CSN: [id number] Arrival date & time: 05/05/18  4098    History   Chief Complaint Chief Complaint  Patient presents with  . Rectal Bleeding  . Chest Pain    HPI Larry Lutz is a 28 y.o. male.     HPI Patient is a 28 year old male who presents the emergency department because of ongoing rectal bleeding.  He has a known history of HIV with intermittent compliance with his medications as well as a history of ulcerative colitis.  He was discharged from the hospital yesterday and seen by GI at that time.  He had been hospitalized for 24 hours for serial CBCs and rectal bleeding.  He was felt to be stable for discharge.  He was discharged home on prednisone despite some ongoing mild bleeding.  He presents because of ongoing bleeding.  No lightheadedness or syncope.  He had nonspecific chest pain for the past 2 days as well. None currently. No hx of MI or PE.  His follow-up GI appointment is not until the first part of March.  No other complaints at this time   Past Medical History:  Diagnosis Date  . Colitis   . HIV (human immunodeficiency virus infection) ([place])   . Hypertension     Patient Active Problem List   Diagnosis Date Noted  . Hematochezia 05/03/2018  . Syphilis contact, untreated 11/10/2017  . Neuropathy of right hand 05/11/2017  . Elevated cholesterol 02/25/2017  . High blood pressure 01/27/2017  . Human immunodeficiency virus (HIV) disease ([place]) 01/05/2017  . Depression with anxiety   . HIV antibody positive ([place]) 06/02/2016  . Ulcerative colitis, acute ([place]) 06/01/2016  . Gastrointestinal hemorrhage 06/01/2016  . Acute on chronic blood loss anemia 06/01/2016    Past Surgical History:  Procedure Laterality Date  . BRAIN SURGERY          Home Medications    Prior to Admission medications   Medication Sig Start Date End Date Taking? Authorizing Provider  bictegravir-emtricitabine-tenofovir  AF (BIKTARVY) 50-200-25 MG TABS tablet Take 1 tablet by mouth daily. 11/28/17  Yes Kuppelweiser, Cassie L, RPH-CPP  doxycycline (VIBRA-TABS) 100 MG tablet Take 1 tablet (100 mg total) by mouth every 12 (twelve) hours for 7 days. 05/04/18 05/11/18  Bonnell Public, MD  mesalamine (LIALDA) 1.2 g EC tablet Take 4 tablets (4.8 g total) by mouth daily with breakfast for 30 days. 05/05/18 06/04/18  Bonnell Public, MD  predniSONE (DELTASONE) 10 MG tablet Prednisone 60 mg po daily for 7 days, then 40 mg po daily for 7 days, then decrease by 10 mg every 7 days. 05/04/18   Bonnell Public, MD    Family History Family History  Problem Relation Age of Onset  . Ulcerative colitis Mother     Social History Social History   Tobacco Use  . Smoking status: Never Smoker  . Smokeless tobacco: Never Used  Substance Use Topics  . Alcohol use: No  . Drug use: Yes    Types: Marijuana, MDMA (Ecstacy)     Allergies   Patient has no known allergies.   Review of Systems Review of Systems  All other systems reviewed and are negative.    Physical Exam Updated Vital Signs BP (!) 133/106   Pulse 80   Temp 98 F (36.7 C) (Oral)   Resp 15   Ht 5' 6"  (1.676 m)   Wt 65.8 kg   SpO2 99%   BMI  23.40 kg/m   Physical Exam Vitals signs and nursing note reviewed.  HENT:     Head: Normocephalic and atraumatic.  Neck:     Musculoskeletal: Normal range of motion.  Cardiovascular:     Rate and Rhythm: Normal rate and regular rhythm.  Pulmonary:     Effort: Pulmonary effort is normal. No respiratory distress.     Breath sounds: Normal breath sounds.  Abdominal:     General: There is no distension.     Tenderness: There is no abdominal tenderness.  Musculoskeletal: Normal range of motion.  Skin:    General: Skin is warm and dry.  Neurological:     Mental Status: He is alert and oriented to person, place, and time.      ED Treatments / Results  Labs (all labs ordered are listed, but  only abnormal results are displayed) Labs Reviewed  CBC - Abnormal; Notable for the following components:      Result Value   WBC 15.3 (*)    Hemoglobin 10.5 (*)    HCT 36.0 (*)    MCV 74.5 (*)    MCH 21.7 (*)    MCHC 29.2 (*)    Platelets 547 (*)    All other components within normal limits  BASIC METABOLIC PANEL - Abnormal; Notable for the following components:   Glucose, Bld 112 (*)    Calcium 8.6 (*)    All other components within normal limits  PROTIME-INR  TYPE AND SCREEN    EKG None  Radiology No results found.  Procedures Procedures (including critical care time)  Medications Ordered in ED Medications - No data to display   Initial Impression / Assessment and Plan / ED Course  I have reviewed the triage vital signs and the nursing notes.  Pertinent labs & imaging results that were available during my care of the patient were reviewed by me and considered in my medical decision making (see chart for details).        Stool evaluated here in the emergency department as he had an episode while in the bed.  This is very thin watery type blood.  No thick clots passed.  Hemoglobin is less than 1 g lower than yesterday.  Rectal bleeding and flare of his ulcerative colitis is somewhat of a recurrent issue for the patient.  At this time with stable vital signs I do not think he needs admission to the hospital.  I have asked that he return to the emergency department tomorrow for repeat evaluation and a repeat hemoglobin at that time.  I do not think he needs acute hospitalization at this time.  Patient understands return to the ER sooner for any new or worsening symptoms otherwise he agrees to return tomorrow for repeat hemoglobin and check of his vital signs.  Final Clinical Impressions(s) / ED Diagnoses   Final diagnoses:  Ulcerative colitis with other complication, unspecified location [facility])  Rectal bleeding    ED Discharge Orders    None       Jola Schmidt,  MD 05/05/18 [place], MD 05/05/18 1248

## 2018-05-05 NOTE — ED Notes (Signed)
Patient verbalizes understanding of discharge instructions. Opportunity for questioning and answers were provided. Armband removed by staff, pt discharged from ED via wheelchair to home.

## 2018-05-08 ENCOUNTER — Encounter: Payer: Self-pay | Admitting: Infectious Diseases

## 2018-05-09 ENCOUNTER — Telehealth: Payer: Self-pay | Admitting: Behavioral Health

## 2018-05-09 NOTE — Telephone Encounter (Signed)
Thanks Caryl Pina. I agree with you and would say that he needs to go to GI for this since they are prescribing the medication.

## 2018-05-09 NOTE — Telephone Encounter (Signed)
Agree - I am not familiar with this medication. The GI team would be the best resource for knowledge about medication assistance around this.

## 2018-05-09 NOTE — Telephone Encounter (Signed)
Patient called stating he was recently hospitalized for Ulcerative Colitis.  He states he was prescribed a medication called Lialda 4.8g oral daily for his UC.  Patient states he is unable to afford the medication because it will be 700 dollars for him out of pocket.  Patient is calling asking if our office can assist him with medication assistance for this medication.  He states he was told in the hospital that [facility] would help him with this.  Patient has a follow up appointment at [facility] GI 05/16/2018.  Advised him to call GI to see if they can assits or have samples.  He states he cannot wait to that appointment on 05/16/2018 and needs the medication now.  Explained to him to do what was suggested and would inform out pharmacy and Janene Madeira to see if they had further suggestions.  Patient verbalized understanding. Pricilla Riffle RN

## 2018-05-10 NOTE — Telephone Encounter (Signed)
Called Larry Lutz and informed him per Janene Madeira NP and [facility] pharmacist he needs to contact his GI office to further assist him with obtaining hies Lialda for his Ulcerative Colitis.  Patient verbalized understanding.  He states was overdue for an appointment with [facility] [place] so he was scheduled and appointment. Pricilla Riffle RN

## 2018-05-16 ENCOUNTER — Encounter: Payer: Self-pay | Admitting: *Deleted

## 2018-05-16 ENCOUNTER — Encounter: Payer: Self-pay | Admitting: Nurse Practitioner

## 2018-05-16 ENCOUNTER — Ambulatory Visit (INDEPENDENT_AMBULATORY_CARE_PROVIDER_SITE_OTHER): Payer: Self-pay | Admitting: Nurse Practitioner

## 2018-05-16 VITALS — BP 138/78 | HR 104 | Ht 66.0 in | Wt 155.0 lb

## 2018-05-16 DIAGNOSIS — K51919 Ulcerative colitis, unspecified with unspecified complications: Secondary | ICD-10-CM

## 2018-05-16 DIAGNOSIS — B20 Human immunodeficiency virus [HIV] disease: Secondary | ICD-10-CM

## 2018-05-16 MED ORDER — PREDNISONE 10 MG PO TABS: ORAL_TABLET | ORAL | 0 refills | Status: DC

## 2018-05-16 MED ORDER — SUPREP BOWEL PREP KIT 17.5-3.13-1.6 GM/177ML PO SOLN: 1.0000 | ORAL | 0 refills | Status: DC

## 2018-05-16 MED ORDER — DICYCLOMINE HCL 20 MG PO TABS: 20.0000 mg | ORAL_TABLET | Freq: Two times a day (BID) | ORAL | 5 refills | Status: DC

## 2018-05-16 NOTE — Progress Notes (Signed)
ASSESSMENT / PLAN:   59. 28 yo HIV positive male with UC diagnosed 2009 in [place].  Last colonoscopy in 2018 while incarcerated in [place]. Previous GI records not available. Off UC meds (Apriso and Imuran) for ~ 2 years and now with 2-3 month history of UC flare type symptoms and CTAP showing diffuse inflammatory changes involving descending and sigmoid colon.  -Inpatient C-diff, GI path panel negative on 05/02/18 -Will try to get 2018 colonoscopy report. -ESR, CRP, CBC -Patient will be scheduled for colonoscopy to get a better idea of disease activity. In the interim will start him on Prednisone 40 mg daily. He will stay on this dose until colonoscopy next week.  -Dicyclomine as needed for lower abdominal discomfort.  -Sounds like Biologic therapy has been entertained in the recent past.  We will know more following colonoscopy next week .  Of note, Quantiferon Gold in March 2018 was indeterminate.   2. Microcytic anemia, probably a combination of chronic disease and GI bleeding related to #1.  Hemoglobin stable at 10.5 as of 05/05/18. Baseline hgb 14.5  -Repeat CBC.   3. HIV, on Biktarvy. CD4 within normal range as of mid December.     HPI:    Chief Complaint: Ulcerative colitis     Patient is a 28 year old male with hx of depression and HIV on Biktarvy though some problems tolerating it secondary to N/V.  Patient was diagnosed with UC in 2009 in [place].  He is new to this practice, referred by Janene Madeira, NP with ID. None of his previous GI records are available. UC was apparently well controlled for years on Apriso (? and Imuran).  He was incarcerated between 2016 -2018 and since then his GI care has been fragmented.  In jail in [place] he was cared for  Dr. Posey Pronto.  Patient reports having had a colonoscopy by Dr. Posey Pronto in 2018, told his colon look like " hamburger meat" and apparently some polyps were removed at the time.  There was a question of starting some other  medication but HIV may have interfered with that.  Patient has not been on any treatment for UC since discharge from jail January 2018.  Patient has since moved to [place].    March 2018- hospitalized with a UC flare seen by [facility] GI, treated with steroids.  Dr. Paulita Fujita with the [facility] GI at that time spoke with ID, who felt comfortable with starting Remicade if   QuantiFERON gold negative.  Not clear what happened from there but patient still remained off UC medication and continued to do poorly.  05/03/2018- hospitalized with right nipple abscess and also UC flare symptoms consisting of bloody diarrhea and lower abdominal pain.   Hemoglobin 10.9 down from baseline of around 14.5 . Patient was again treated with steroids, plan was to discuss with ID about Biologics.  Patient was unhappy with the care at the hospital, felt like UC symptoms were not improving. Stool studies were negative.  He was discharged home on prednisone taper and Lialda.  He has already completed the prednisone as the quantity of pills was insufficient for the taper described on discharge instructions. He couldn't afford the Lialda.  Patient is here with ongoing lower abdominal pain.  The pain is constant, diffuse across the lower abdomen and unrelated to meals nor defecation.  No urinary symptoms.  He continues to have diarrhea though the amount of  bleeding has improved. Not having significant amounts of diarrhea, some stools are solid.  No fevers nor joint aches.  Apparently patient was not able to get into see [facility] for hospital follow up for quite some time  Still on doxycycline for nipple abscess.   Patient does not consume alcohol.  He smokes 1 joint a day, mainly to control his abdominal pain  Data Reviewed:  CTAP 05/01/18 IMPRESSION: 1. Suspect diffuse inflammatory or infectious colitis involving the rectum and descending colon and sigmoid colon with numerous small perirectal and sigmoid mesocolon lymph nodes,  likely inflammatory/hyperplastic. This looks very similar to the patient's prior CT scan. Possible ulcerative colitis. 2. No perirectal abscess or colonic mass. 3. 2.8 x 2.0 cm rim enhancing fluid collection near the right nipple suspicious for abscess.  Ferritin 94, TIBC 320, saturation percent.  Folate 7.9 As of 05/05/2018 white count 15.3, hemoglobin 10.5, MCV 74, platelets 574   Past Medical History:  Diagnosis Date  . Anxiety   . Asthma   . Colitis   . Colon polyp   . Crohn disease ([place])   . Depressed   . GI bleed   . HIV (human immunodeficiency virus infection) ([place])   . Hypertension   . IBS (irritable bowel syndrome)   . UC (ulcerative colitis) [facility])     Past Surgical History:  Procedure Laterality Date  . BRAIN SURGERY    . COLONOSCOPY     in [place] around 2017 or 2018    Family History  Problem Relation Age of Onset  . Ulcerative colitis Mother   . Irritable bowel syndrome Mother   . Prostate cancer Paternal Grandfather   . Diabetes Paternal Aunt   . Colon cancer Neg Hx   . Esophageal cancer Neg Hx    Social History   Tobacco Use  . Smoking status: Current Every Day Smoker  . Smokeless tobacco: Never Used  Substance Use Topics  . Alcohol use: No  . Drug use: Yes    Types: Marijuana, MDMA (Ecstacy)   Current Outpatient Medications  Medication Sig Dispense Refill  . bictegravir-emtricitabine-tenofovir AF (BIKTARVY) 50-200-25 MG TABS tablet Take 1 tablet by mouth daily. 30 tablet 5  . predniSONE (DELTASONE) 10 MG tablet Prednisone 60 mg po daily for 7 days, then 40 mg po daily for 7 days, then decrease by 10 mg every 7 days. 112 tablet 0  . mesalamine (LIALDA) 1.2 g EC tablet Take 4 tablets (4.8 g total) by mouth daily with breakfast for 30 days. (Patient not taking: Reported on 05/16/2018) 120 tablet 0   No current facility-administered medications for this visit.    Allergies  Allergen Reactions  . Morphine And Related     Caused him to itch      Review of Systems: Positive for anxiety. All systems reviewed and negative except where noted in HPI.   Serum creatinine: 1.23 mg/dL 05/05/18 1014 Estimated creatinine clearance: 80 mL/min   Physical Exam:    Wt Readings from Last 3 Encounters:  05/16/18 155 lb (70.3 kg)  05/05/18 145 lb (65.8 kg)  05/01/18 145 lb (65.8 kg)    BP 138/78   Pulse (!) 104   Ht _0  (1.676 m)   Wt 155 lb (70.3 kg)   BMI 25.02 kg/m  Constitutional:  Pleasant male in no acute distress. Psychiatric: Normal mood and affect. Behavior is normal. EENT: Pupils normal.  Conjunctivae are normal. No scleral icterus. Neck supple.  Cardiovascular: Normal rate, regular rhythm.  No edema Pulmonary/chest: Effort normal and breath sounds normal. No wheezing, rales or rhonchi. Abdominal: Soft, nondistended, nontender. Bowel sounds active throughout. There are no masses palpable. No hepatomegaly. Neurological: Alert and oriented to person place and time. Skin: Skin is warm and dry. No rashes noted. Multiple tatoos  Tye Savoy, NP  05/16/2018, 10:00 AM  Cc: Kinder Callas, NP

## 2018-05-16 NOTE — Patient Instructions (Signed)
If you are age 29 or older, your body mass index should be between 23-30. Your Body mass index is 25.02 kg/m. If this is out of the aforementioned range listed, please consider follow up with your Primary Care Provider.  If you are age 37 or younger, your body mass index should be between 19-25. Your Body mass index is 25.02 kg/m. If this is out of the aformentioned range listed, please consider follow up with your Primary Care Provider.    You have been scheduled for a colonoscopy. Please follow written instructions given to you at your visit today.  Please pick up your prep supplies at the pharmacy within the next 1-3 days. If you use inhalers (even only as needed), please bring them with you on the day of your procedure. Your physician has requested that you go to www.startemmi.com and enter the access code given to you at your visit today. This web site gives a general overview about your procedure. However, you should still follow specific instructions given to you by our office regarding your preparation for the procedure.  We have sent the following medications to your pharmacy for you to pick up at your convenience: Prednisone and Bentyl  You requested to have labs done at outside clinic [facility]. Lab order has been faxed to their office.   Thank you for choosing me and [facility].  [place]

## 2018-05-17 ENCOUNTER — Encounter: Payer: Self-pay | Admitting: Infectious Diseases

## 2018-05-17 ENCOUNTER — Ambulatory Visit (INDEPENDENT_AMBULATORY_CARE_PROVIDER_SITE_OTHER): Payer: Self-pay | Admitting: Infectious Diseases

## 2018-05-17 VITALS — BP 147/89 | HR 132 | Temp 98.2°F | Wt 154.0 lb

## 2018-05-17 DIAGNOSIS — K51911 Ulcerative colitis, unspecified with rectal bleeding: Secondary | ICD-10-CM

## 2018-05-17 DIAGNOSIS — Z5900 Homelessness unspecified: Secondary | ICD-10-CM

## 2018-05-17 DIAGNOSIS — R109 Unspecified abdominal pain: Secondary | ICD-10-CM

## 2018-05-17 DIAGNOSIS — B20 Human immunodeficiency virus [HIV] disease: Secondary | ICD-10-CM

## 2018-05-17 DIAGNOSIS — D62 Acute posthemorrhagic anemia: Secondary | ICD-10-CM

## 2018-05-17 LAB — CBC
HCT: 28.1 % — ABNORMAL LOW (ref 38.5–50.0)
Hemoglobin: 8.9 g/dL — ABNORMAL LOW (ref 13.2–17.1)
MCH: 22.3 pg — ABNORMAL LOW (ref 27.0–33.0)
MCHC: 31.7 g/dL — ABNORMAL LOW (ref 32.0–36.0)
MCV: 70.4 fL — ABNORMAL LOW (ref 80.0–100.0)
MPV: 8.8 fL (ref 7.5–12.5)
Platelets: 755 10*3/uL — ABNORMAL HIGH (ref 140–400)
RBC: 3.99 10*6/uL — ABNORMAL LOW (ref 4.20–5.80)
RDW: 15.2 % — ABNORMAL HIGH (ref 11.0–15.0)
WBC: 20.4 10*3/uL — ABNORMAL HIGH (ref 3.8–10.8)

## 2018-05-17 NOTE — Progress Notes (Signed)
 Patient Name: Larry Lutz  DOB: 11/06/1990  MRN: [id number]   Patient Active Problem List   Diagnosis Date Noted  . Abdominal cramps 05/19/2018  . Homeless 05/19/2018  . Hematochezia 05/03/2018  . Neuropathy of right hand 05/11/2017  . Elevated cholesterol 02/25/2017  . High blood pressure 01/27/2017  . Human immunodeficiency virus (HIV) disease (HCC) 01/05/2017  . Depression with anxiety   . Ulcerative colitis, acute (HCC) 06/01/2016  . Gastrointestinal hemorrhage 06/01/2016  . Acute on chronic blood loss anemia 06/01/2016   SUBJECTIVE: Brief Narrative:  Larry Lutz is a 27 y.o. AA male with HIV infection. Originally diagnosed with HIV in 2017 in [facility] (no records available). Never on medications in the past but had initially undetectable VL until recently without explanation. History of OIs: none  Previous Regimen:   Biktarvy >> suppressed   Genotype:   wild type 12/2016.   CC: HIV follow up care. Post-hospital d/c follow up with UC flare, anemia and severe abdominal pain.   HPI:  Interval history noted for hospitalizations and ER visits for ulcerative colitis flares. Saw [facility] for new patient visit - consideration to start on biologic agent in the future but for now receiving prednisone 40 mg daily. He is scheduled for a colonoscopy next week to evaluate. Previously quantiferon gold was indeterminate at entry to care with [facility].   Larry Lutz tells me he has been having a really hard time lately. He has been taking his biktarvy every day up until 2 weeks ago when he was evicted. He had his bottle in storage and was not able to get access to this until recently. Back on medication regularly now over the last 4 days. He is very nervous to stop his HIV medication and is glad he is taking it again. He is tearful today in telling me that he was evicted from is apartment, lost his job and is now staying in a hotel. He has been discussing with a close friend for emotional support. He is  upset because his sister won't allow him to stay with her short term. He has had ongoing bleeding from rectum and abdominal pain/distension; he is having fecal incontinence which is making it hard for him to use the bus system to get to where he needs to go timely. He is not having any fevers, dizziness or headaches. He does have dry mouth and rapid heart beat. He cannot afford the bentyl that he was recommended and is having a hard time dealing with the pain and does not want to go back to the ER. He was last given Vicodin 5/325 on 2/17 and reports that he needs 1 and 1/2 tabs twice a day to deal with the pain so he can eat. He also mentions that he has started having nearly debilitating cramps in lower extremities/calves that come on randomly throughout the day.   Review of Systems  All other systems reviewed and are negative. 12-point review   Past Medical History:  Diagnosis Date  . Anxiety   . Asthma   . Colitis   . Colon polyp   . Crohn disease (HCC)   . Depressed   . GI bleed   . HIV (human immunodeficiency virus infection) (HCC)   . Hypertension   . IBS (irritable bowel syndrome)   . UC (ulcerative colitis) (HCC)    Allergies  Allergen Reactions  . Morphine And Related     Caused him to itch     Outpatient Medications Prior   to Visit  Medication Sig Dispense Refill  . bictegravir-emtricitabine-tenofovir AF (BIKTARVY) 50-200-25 MG TABS tablet Take 1 tablet by mouth daily. 30 tablet 5  . SUPREP BOWEL PREP KIT 17.5-3.13-1.6 GM/177ML SOLN Take 1 kit by mouth as directed. For colonoscopy prep 2 Bottle 0  . mesalamine (LIALDA) 1.2 g EC tablet Take 4 tablets (4.8 g total) by mouth daily with breakfast for 30 days. (Patient not taking: Reported on 05/16/2018) 120 tablet 0  . predniSONE (DELTASONE) 10 MG tablet Take 4 tablet by mouth once daily until colonoscopy. (Patient not taking: Reported on 05/17/2018) 100 tablet 0  . dicyclomine (BENTYL) 20 MG tablet Take 1 tablet (20 mg total) by  mouth 2 (two) times daily. (Patient not taking: Reported on 05/17/2018) 60 tablet 5   No facility-administered medications prior to visit.     Social History   Tobacco Use  . Smoking status: Current Every Day Smoker  . Smokeless tobacco: Never Used  Substance Use Topics  . Alcohol use: No  . Drug use: Yes    Types: Marijuana, MDMA (Ecstacy)    Family History  Problem Relation Age of Onset  . Ulcerative colitis Mother   . Irritable bowel syndrome Mother   . Prostate cancer Paternal Grandfather   . Diabetes Paternal Aunt   . Colon cancer Neg Hx   . Esophageal cancer Neg Hx    Male partners, infrequent condom use  Physical Exam and Objective Findings:  Vitals:   05/17/18 1621  BP: (!) 147/89  Pulse: (!) 132  Temp: 98.2 F (36.8 C)  TempSrc: Oral  Weight: 154 lb (69.9 kg)   Body mass index is 24.86 kg/m.  Physical Exam  Constitutional: He is oriented to person, place, and time.  Seated in chair, appears to be in pain. Crying at times in discussion over what has happened with him and pain  HENT:  Mouth/Throat: No oropharyngeal exudate.  Dry mucous membranes  Eyes: Pupils are equal, round, and reactive to light. No scleral icterus.  Cardiovascular: Regular rhythm and normal heart sounds. Tachycardia present.  Pulmonary/Chest: Effort normal and breath sounds normal. No respiratory distress.  Abdominal: He exhibits distension. There is abdominal tenderness.  Neurological: He is alert and oriented to person, place, and time.  Skin: Skin is warm and dry. No rash noted.  Psychiatric: Judgment normal. He exhibits a depressed mood. He is not apathetic.  Vitals reviewed.   Lab Results Lab Results  Component Value Date   WBC 20.4 (H) 05/17/2018   HGB 8.9 (L) 05/17/2018   HCT 28.1 (L) 05/17/2018   MCV 70.4 (L) 05/17/2018   PLT 755 (H) 05/17/2018    Lab Results  Component Value Date   CREATININE 0.92 05/17/2018   BUN 23 05/17/2018   NA 139 05/17/2018   K 4.2  05/17/2018   CL 103 05/17/2018   CO2 26 05/17/2018    Lab Results  Component Value Date   ALT 21 05/04/2018   AST 20 05/04/2018   ALKPHOS 71 05/04/2018   BILITOT 0.5 05/04/2018    Lab Results  Component Value Date   CHOL 236 (H) 01/05/2017   HDL 45 01/05/2017   LDLCALC 163 (H) 01/05/2017   TRIG 138 01/05/2017   CHOLHDL 5.2 (H) 01/05/2017   HIV 1 RNA Quant (copies/mL)  Date Value  02/22/2018 25 (H)  10/17/2017 <20 DETECTED  05/11/2017 <20 DETECTED (A)   CD4 T Cell Abs (/uL)  Date Value  05/17/2018 470  02/22/2018 690  10/17/2017  970   Lab Results  Component Value Date   HAV REACTIVE (A) 01/05/2017   Lab Results  Component Value Date   HEPBSAG NON-REACTIVE 01/05/2017   HEPBSAB REACTIVE (A) 01/05/2017   No results found for: HCVAB Lab Results  Component Value Date   CHLAMYDIAWP Negative 02/22/2018   CHLAMYDIAWP Negative 02/22/2018   CHLAMYDIAWP Negative 02/22/2018   N Negative 02/22/2018   N Negative 02/22/2018   N Negative 02/22/2018   ASSESSMENT & PLAN:    Problem List Items Addressed This Visit      Unprioritized   Abdominal cramps    We found a GoodRx coupon for bentyl 20 mg capsules 1m supply $17 through [employer] [facility] program. He will call to transfer prescription there.  Will also send in small supply of Vicodin to be taken BID to same pharmacy for severe pain. NCCRS reviewed and no abnormalities that are discordant with what he reports.       Acute on chronic blood loss anemia    Will repeat blood counts today and forward to GI team. I do worry he is getting to the point where he needs a blood transfusion with HR in 120s today. He is otherwise not symptomatic. Will call with results.       Homeless    Will reach out to Taylor with [facility] to help call Aasir to encourage enrollment into [facility] vs [facility] medical CM services. Will provide him with bus passes today.       Human immunodeficiency virus (HIV) disease (HCC) - Primary (Chronic)    Back on his  biktarvy. He is requesting VL to be checked today - will check VL/CD4. He is on prednisone 40mg QD and if his CD4 is < 100 would consider putting him back on bactrim for prophylaxis even if undetectable. Also explained that being off HAART x 2w it is likely we will find a low-level viremia but the most important thing is to get him back on biktarvy regularly.   Will repeat quantiferon gold assay today with consideration of future biologic agent. If his quantiferon is positive would prefer to treat with 4m rifampin for LTBI prior to any consideration of biologics. Would not want to do INH with his anemia as this can cause worsening myelosuppression. If again indeterminate results will arrange for TST through health department for further evaluation.   Return in about 3 months (around 08/17/2018). or sooner if needed.       Relevant Orders   QuantiFERON-TB Gold Plus (Completed)   HIV-1 RNA quant-no reflex-bld   T-helper cell (CD4)- ([facility] clinic only) (Completed)   Ulcerative colitis, acute (HCC)    Appreciate GI team's assistance. Colonoscopy Monday to determine further planning.  Prednisone 40 mg QD for now.  Repeat ESR/CRP as requested by GI to follow  Supplied with some depends today to help with his incontinence.       Relevant Orders   Sedimentation rate (Completed)   C-reactive protein (Completed)   CBC (Completed)   Basic metabolic panel (Completed)     Return in about 3 months (around 08/17/2018).   Stephanie Dixon, MSN, NP-C [facility] Pager: [phone]  05/19/2018  2:44 PM   

## 2018-05-17 NOTE — Patient Instructions (Addendum)
Great job getting back on [employer] - please continue this every day as you are. We will check your levels today and release results to MyChart for you and send to your GI team.   We gave you a list of medications that are on the medication list covered by [employer].   We can see you again in 3 months or sooner if needed.   Will forward all results from today to your GI team.   Will have Lovena Le from [facility] call you tomorrow.   I want you to try the bentyl for the pain first.  We arrange a coupon for [facility] to get this for $16. Please call [facility] on [address] [phone] to transfer and have them transfer the prescription to Kristopher Oppenheim at [facility] location their phone # is [phone].

## 2018-05-17 NOTE — Progress Notes (Signed)
____________________________________________________________  Attending physician addendum:  Thank you for sending this case to me. I have reviewed the entire note, and the outlined plan seems appropriate.  He appears to have very active UC.  Prednisone the right choice - colonoscopy soon with [place] since poor outpatient follow up /coordination of care with [facility] (who saw him in recent hospital consult).   [facility] may be the right choice for him - will see what colonoscopy shows.     Wilfrid Lund, MD  ____________________________________________________________

## 2018-05-18 ENCOUNTER — Telehealth: Payer: Self-pay

## 2018-05-18 LAB — SEDIMENTATION RATE: Sed Rate: 36 mm/h — ABNORMAL HIGH (ref 0–15)

## 2018-05-18 MED ORDER — DICYCLOMINE HCL 20 MG PO TABS: 20.0000 mg | ORAL_TABLET | Freq: Two times a day (BID) | ORAL | 0 refills | Status: DC

## 2018-05-18 MED ORDER — HYDROCODONE-ACETAMINOPHEN 5-325 MG PO TABS: 1.0000 | ORAL_TABLET | Freq: Two times a day (BID) | ORAL | 0 refills | Status: DC | PRN

## 2018-05-18 NOTE — Telephone Encounter (Signed)
-----   Message from Horace Callas, NP sent at 05/18/2018  8:55 AM EST ----- Please call Larry Lutz to let him know that his hemoglobin has dropped a little bit more and is now 8.9. No need for transfusion at this time. His electrolytes are all normal and I think much of the cramping of his lower leg muscles can be due to dehydration and low iron. Would encourage him to continue drinking fluids - sip on something all day long, always have water in hand to drink. HIV labs are still pending.

## 2018-05-18 NOTE — Progress Notes (Signed)
Hgb down to 8.9. He was tachycardic yesterday during visit at rest but otherwise stable and w/o symptoms. He has a colonoscopy scheduled next week - will forward labs to GI team. ESR with some improvement. CRP pending. Repeat Quantiferon pending - if still indeterminate will set up for TST via health department. He is having significant cramping of the lower extremities - ?related to dehydration vs iron deficiency from blood loss.

## 2018-05-18 NOTE — Telephone Encounter (Signed)
Marcene Brawn, per Janene Madeira NP. Discussed results.   Rashed expressed understanding with feedback. No further questions at the moment.  Reminded [facility] that more results are pending.   Lenore Cordia, [place]

## 2018-05-19 DIAGNOSIS — Z5900 Homelessness unspecified: Secondary | ICD-10-CM | POA: Insufficient documentation

## 2018-05-19 DIAGNOSIS — R109 Unspecified abdominal pain: Secondary | ICD-10-CM | POA: Insufficient documentation

## 2018-05-19 LAB — T-HELPER CELL (CD4) - (RCID CLINIC ONLY)
CD4 % Helper T Cell: 23 % — ABNORMAL LOW (ref 33–55)
CD4 T CELL ABS: 470 /uL (ref 400–2700)

## 2018-05-19 LAB — BASIC METABOLIC PANEL
BUN: 23 mg/dL (ref 7–25)
CO2: 26 mmol/L (ref 20–32)
Calcium: 8.7 mg/dL (ref 8.6–10.3)
Chloride: 103 mmol/L (ref 98–110)
Creat: 0.92 mg/dL (ref 0.60–1.35)
Glucose, Bld: 140 mg/dL — ABNORMAL HIGH (ref 65–99)
Potassium: 4.2 mmol/L (ref 3.5–5.3)
Sodium: 139 mmol/L (ref 135–146)

## 2018-05-19 LAB — HIV-1 RNA QUANT-NO REFLEX-BLD
HIV 1 RNA Quant: 83 copies/mL — ABNORMAL HIGH
HIV-1 RNA Quant, Log: 1.92 Log copies/mL — ABNORMAL HIGH

## 2018-05-19 LAB — QUANTIFERON-TB GOLD PLUS
Mitogen-NIL: 4.56 IU/mL
NIL: 0.04 IU/mL
QuantiFERON-TB Gold Plus: NEGATIVE
TB1-NIL: 0 IU/mL
TB2-NIL: 0 IU/mL

## 2018-05-19 LAB — C-REACTIVE PROTEIN: CRP: 3.9 mg/L (ref <8.0)

## 2018-05-19 NOTE — Assessment & Plan Note (Signed)
We found a GoodRx coupon for bentyl 20 mg capsules 73msupply [place]17 [facility] He will call to transfer prescription there.  Will also send in small supply of Vicodin to be taken BID to same pharmacy for severe pain. NCCRS reviewed and no abnormalities that are discordant with what he reports.

## 2018-05-19 NOTE — Assessment & Plan Note (Signed)
Back on his biktarvy. He is requesting VL to be checked today - will check VL/CD4. He is on prednisone 26m QD and if his CD4 is < 100 would consider putting him back on bactrim for prophylaxis even if undetectable. Also explained that being off HAART x 2w it is likely we will find a low-level viremia but the most important thing is to get him back on biktarvy regularly.   Will repeat quantiferon gold assay today with consideration of future biologic agent. If his quantiferon is positive would prefer to treat with 435mifampin for LTBI prior to any consideration of biologics. Would not want to do INH with his anemia as this can cause worsening myelosuppression. If again indeterminate results will arrange for TST through health department for further evaluation.   Return in about 3 months (around 08/17/2018). or sooner if needed.

## 2018-05-19 NOTE — Assessment & Plan Note (Addendum)
Appreciate GI team's assistance. Colonoscopy Monday to determine further planning.  Prednisone 40 mg QD for now.  Repeat ESR/CRP as requested by GI to follow  Supplied with some depends today to help with his incontinence.

## 2018-05-19 NOTE — Assessment & Plan Note (Signed)
Will reach out to [facility] with [facility] to help call Larry Lutz to encourage enrollment into [facility] vs [facility] medical CM services. Will provide him with bus passes today.

## 2018-05-19 NOTE — Assessment & Plan Note (Signed)
Will repeat blood counts today and forward to GI team. I do worry he is getting to the point where he needs a blood transfusion with HR in 120s today. He is otherwise not symptomatic. Will call with results.

## 2018-05-19 NOTE — Progress Notes (Signed)
Quantiferon repeated and is negative - we can see indeterminate results in the setting of immunosuppression. Now that he has been well established on his medication and immune system is better functioning test negative.

## 2018-05-22 ENCOUNTER — Other Ambulatory Visit: Payer: Self-pay

## 2018-05-22 ENCOUNTER — Encounter: Payer: Self-pay | Admitting: Gastroenterology

## 2018-05-22 ENCOUNTER — Ambulatory Visit (AMBULATORY_SURGERY_CENTER): Payer: Federal, State, Local not specified - Other | Admitting: Gastroenterology

## 2018-05-22 VITALS — BP 124/81 | HR 105 | Temp 97.1°F | Resp 15 | Ht 66.0 in | Wt 155.0 lb

## 2018-05-22 DIAGNOSIS — K625 Hemorrhage of anus and rectum: Secondary | ICD-10-CM

## 2018-05-22 DIAGNOSIS — B259 Cytomegaloviral disease, unspecified: Secondary | ICD-10-CM

## 2018-05-22 DIAGNOSIS — K529 Noninfective gastroenteritis and colitis, unspecified: Secondary | ICD-10-CM

## 2018-05-22 DIAGNOSIS — K51919 Ulcerative colitis, unspecified with unspecified complications: Secondary | ICD-10-CM

## 2018-05-22 MED ORDER — SODIUM CHLORIDE 0.9 % IV SOLN: 500.0000 mL | Freq: Once | INTRAVENOUS | Status: DC

## 2018-05-22 NOTE — Progress Notes (Signed)
PT taken to PACU. Monitors in place. VSS. Report given to RN. 

## 2018-05-22 NOTE — Patient Instructions (Signed)
Continue present meds including Prednisone 84m by mouth daily. Staff from 3 rd floor will call you in the next 2 days to schedule office appointment. Await pathology.    [facility]   Refer to the procedure report that was given to you for any specific questions about what was found during the examination.  If the procedure report does not answer your questions, please call your gastroenterologist to clarify.  If you requested that your care partner not be given the details of your procedure findings, then the procedure report has been included in a sealed envelope for you to review at your convenience later.  YOU SHOULD EXPECT: Some feelings of bloating in the abdomen. Passage of more gas than usual.  Walking can help get rid of the air that was put into your GI tract during the procedure and reduce the bloating. If you had a lower endoscopy (such as a colonoscopy or flexible sigmoidoscopy) you may notice spotting of blood in your stool or on the toilet paper. If you underwent a bowel prep for your procedure, you may not have a normal bowel movement for a few days.  Please Note:  You might notice some irritation and congestion in your nose or some drainage.  This is from the oxygen used during your procedure.  There is no need for concern and it should clear up in a day or so.  SYMPTOMS TO REPORT IMMEDIATELY:   Following lower endoscopy (colonoscopy or flexible sigmoidoscopy):  Excessive amounts of blood in the stool  Significant tenderness or worsening of abdominal pains  Swelling of the abdomen that is new, acute  Fever of 100F or higher   For urgent or emergent issues, a gastroenterologist can be reached at any hour by calling [phone]   DIET:  We do recommend a small meal at first, but then you may proceed to your regular diet.  Drink plenty of fluids but you should avoid alcoholic beverages for 24 hours.  ACTIVITY:   You should plan to take it easy for the rest of today and you should NOT DRIVE or use heavy machinery until tomorrow (because of the sedation medicines used during the test).    FOLLOW UP: Our staff will call the number listed on your records the next business day following your procedure to check on you and address any questions or concerns that you may have regarding the information given to you following your procedure. If we do not reach you, we will leave a message.  However, if you are feeling well and you are not experiencing any problems, there is no need to return our call.  We will assume that you have returned to your regular daily activities without incident.  If any biopsies were taken you will be contacted by phone or by letter within the next 1-3 weeks.  Please call [place] [phone] you have not heard about the biopsies in 3 weeks.    SIGNATURES/CONFIDENTIALITY: You and/or your care partner have signed paperwork which will be entered into your electronic medical record.  These signatures attest to the fact that that the information above on your After Visit Summary has been reviewed and is understood.  Full responsibility of the confidentiality of this discharge information lies with you and/or your care-partner.

## 2018-05-22 NOTE — Op Note (Signed)
[facility] Patient Name: Larry Lutz Procedure Date: 05/22/2018 3:40 PM MRN: [id number] Endoscopist: Mallie Mussel L. Loletha Carrow , MD Age: 28 Referring MD:  Date of Birth: 1990/05/26 Gender: Male Account #: [id number] Procedure:                Colonoscopy Indications:              Chronic diarrhea, Hematochezia, Iron deficiency                            anemia secondary to chronic blood loss, Ulcerative                            colitis Medicines:                Monitored Anesthesia Care Procedure:                Pre-Anesthesia Assessment:                           - Prior to the procedure, a History and Physical                            was performed, and patient medications and                            allergies were reviewed. The patient's tolerance of                            previous anesthesia was also reviewed. The risks                            and benefits of the procedure and the sedation                            options and risks were discussed with the patient.                            All questions were answered, and informed consent                            was obtained. Prior Anticoagulants: The patient has                            taken no previous anticoagulant or antiplatelet                            agents. ASA Grade Assessment: II - A patient with                            mild systemic disease. After reviewing the risks                            and benefits, the patient was deemed in  satisfactory condition to undergo the procedure.                           After obtaining informed consent, the colonoscope                            was passed under direct vision. Throughout the                            procedure, the patient's blood pressure, pulse, and                            oxygen saturations were monitored continuously. The                            Colonoscope was introduced through the anus with                    the intention of advancing to the cecum. The scope                            was advanced to the sigmoid colon before the                            procedure was aborted. Medications were given. The                            colonoscopy was performed with difficulty due to                            inadequate bowel prep. The patient tolerated the                            procedure. The quality of the bowel preparation was                            poor WITH A LARGE AMOUNT OF SOLID STOOL PRECLUDING                            FURTHER SCOPE ADVANCEMENT AND LIMITING MUCOSAL                            VISUALIZATION. Scope In: 3:51:02 PM Scope Out: 3:56:51 PM Scope Withdrawal Time: 0 hours 2 minutes 9 seconds  Total Procedure Duration: 0 hours 5 minutes 49 seconds  Findings:                 The perianal and digital rectal examinations were                            normal.                           A large amount of solid stool was found in the  rectum and in the sigmoid colon, interfering with                            visualization.                           Inflammation characterized by altered vascularity,                            congestion (edema), erythema, mucus, pseudopolyps                            and scarring was found in a continuous and                            circumferential pattern from the anus to the                            sigmoid colon. This was severe. Biopsies were taken                            with a cold forceps for histology. Complications:            No immediate complications. Estimated Blood Loss:     Estimated blood loss was minimal. Impression:               - Preparation of the colon was poor.                           - Stool in the rectum and in the sigmoid colon.                           - Colitis. Inflammation was found from the anus to                            the sigmoid colon. This was  severe. Biopsied. Recommendation:           - Patient has a contact number available for                            emergencies. The signs and symptoms of potential                            delayed complications were discussed with the                            patient. Return to normal activities tomorrow.                            Written discharge instructions were provided to the                            patient.                           - Resume  previous diet.                           - Continue present medications, including                            prednisone 40 mg once daily.                           - Await pathology results.                           - Return to my office at appointment to be                            scheduled. Most likely plan will be biologic IBD                            medicine such as vedolizumab or infliximab. Henry L. Loletha Carrow, MD 05/22/2018 4:05:41 PM This report has been signed electronically. CC Letter to:             Janene Madeira, NP (Infect[facility] Disease Clinic)

## 2018-05-23 ENCOUNTER — Other Ambulatory Visit: Payer: Self-pay

## 2018-05-23 ENCOUNTER — Telehealth: Payer: Self-pay

## 2018-05-23 ENCOUNTER — Telehealth: Payer: Self-pay | Admitting: *Deleted

## 2018-05-23 DIAGNOSIS — K5190 Ulcerative colitis, unspecified, without complications: Secondary | ICD-10-CM

## 2018-05-23 DIAGNOSIS — D509 Iron deficiency anemia, unspecified: Secondary | ICD-10-CM

## 2018-05-23 NOTE — Telephone Encounter (Signed)
  Follow up Call-  Call back number 05/22/2018  Post procedure Call Back phone  # [phone]  Permission to leave phone message Yes     Patient questions:  Do you have a fever, pain , or abdominal swelling? No. Pain Score  0 *  Have you tolerated food without any problems? Yes.    Have you been able to return to your normal activities? Yes.    Do you have any questions about your discharge instructions: Diet   No. Medications  No. Follow up visit  No.  Do you have questions or concerns about your Care? No.  Actions: * If pain score is 4 or above: No action needed, pain <4.

## 2018-05-23 NOTE — Telephone Encounter (Signed)
Called [phone] and left a messaged we tried to reach pt for a follow up call. maw

## 2018-05-26 ENCOUNTER — Telehealth: Payer: Self-pay | Admitting: Gastroenterology

## 2018-05-26 ENCOUNTER — Encounter (HOSPITAL_COMMUNITY): Payer: Self-pay

## 2018-05-26 NOTE — Telephone Encounter (Signed)
Pt advised.

## 2018-05-26 NOTE — Telephone Encounter (Signed)
I will be tied up in endoscopy the rest of the day.  My advice remains the same.  - HD

## 2018-05-26 NOTE — Telephone Encounter (Signed)
I received a call from Dr. Vic Ripper, the pathologist reviewing Larry Lutz colon biopsies. 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  It will need a course of intravenous antiviral therapy.  Given his immune compromised status, severity of colitis and lack of medical coverage, he needs to be admitted to the hospital to be evaluated begin treatment.  Please contact him today and instruct him to come to the [facility] long hospital emergency department today.  Contact the triage nurse there to let them know he will be coming in that he will need admission to the medical service with consultation by our practice and the infectious disease service (who have recently seen him as outpatient).  I will speak to our weekend call doctor to let them know.  If you are unable to reach Larry Lutz, please call his sister Larry Lutz, whose number is listed in the chart and was with him for his colonoscopy earlier this week.

## 2018-05-26 NOTE — Telephone Encounter (Signed)
Spoke with pt and he is aware of the results and the need to go to the hospital. Pt reports his colitis symptoms are better, explained this is most likely due to the prednisone. Discussed with pt that he needs to proceed to [facility] ER. Pt states he will probably go tomorrow, he has to work today and cannot call out. Pt requests that Dr. Loletha Carrow call him, he wants to speak with him directly. Dr. Loletha Carrow notified.

## 2018-05-29 ENCOUNTER — Encounter (HOSPITAL_COMMUNITY): Payer: Self-pay

## 2018-05-31 ENCOUNTER — Encounter: Payer: Self-pay | Admitting: Gastroenterology

## 2018-05-31 ENCOUNTER — Telehealth: Payer: Self-pay | Admitting: Gastroenterology

## 2018-05-31 NOTE — Telephone Encounter (Signed)
Thinking more about his situation and the current concerns surrounding reducing exposures, I spoke with Dr. Megan Salon about options given his difficult social circumstances and what I would liek to propose is to start him on PO valcyte for treatment without IV induction to keep him out of the hospital, of course if we can get in touch with him with close monitoring.  That would be our preferred tx plan for him.   I tried to reach the number listed being his sister and whoever it was on that number kindly asked me to stop calling.

## 2018-05-31 NOTE — Telephone Encounter (Signed)
Unfortunately we have not been able to reach him. We will continue to try to reach him and once we do I will update you.   I think it is likely best to arrange for hospitalization and monitoring given his difficult history to get in touch with him and keep open communication with high risk medication.

## 2018-05-31 NOTE — Telephone Encounter (Signed)
Ms. Doren Custard,  Has your office made any progress contacting this patient to make plans for treatment of his CMV colitis?  As you saw from previous result notes, it was my intention that the patient present to the hospital for admission and management of this complex condition.  Given the current infectious epidemic, it is not clear if that plan is feasible or wise due to this patient's immunocompromised status.  If not, he needs outpatient management now.

## 2018-05-31 NOTE — Telephone Encounter (Signed)
I will compose a letter which we will mail to him today and strongly advised him to contact your office immediately for treatment of this condition.  I recommend you do the same if you are unable to contact him by phone.

## 2018-05-31 NOTE — Telephone Encounter (Signed)
Last I heard from him prior to his colonoscopy he told me he was evicted from his apartment and was staying in a hotel locally.   I sent him a MyChart message which he read 05/30/18 per the time stamp. I sent another one today.

## 2018-06-01 ENCOUNTER — Other Ambulatory Visit: Payer: Self-pay

## 2018-06-01 ENCOUNTER — Telehealth: Payer: Self-pay | Admitting: Behavioral Health

## 2018-06-01 ENCOUNTER — Other Ambulatory Visit: Payer: Self-pay | Admitting: Behavioral Health

## 2018-06-01 DIAGNOSIS — H538 Other visual disturbances: Secondary | ICD-10-CM

## 2018-06-01 DIAGNOSIS — B259 Cytomegaloviral disease, unspecified: Principal | ICD-10-CM

## 2018-06-01 DIAGNOSIS — A0839 Other viral enteritis: Secondary | ICD-10-CM

## 2018-06-01 MED ORDER — VALGANCICLOVIR HCL 450 MG PO TABS: 900.0000 mg | ORAL_TABLET | Freq: Two times a day (BID) | ORAL | 0 refills | Status: AC

## 2018-06-01 NOTE — Telephone Encounter (Signed)
Please try to reach this patient by phone for the following medication dosing instructions:  Continue 40 mg of prednisone daily until April 1, then decrease to 30 mg once daily.  On April 8, decrease to 20 mg once daily. Send a new prescription if necessary so he has a sufficient supply of prednisone until his next appointment with me. His current appointment with me is scheduled for April 24.  Please move it up to the week before that.  Definitely take the CMV medication as recommended by the infectious disease clinic.  He does not need to take someone else's medication, since mesalamine is not adding much to the control of this colitis while he is on this dose of prednisone.  If we have some samples of Apriso, please give him enough for 7 to 14-day supply (depending on how much we have) to take 4 capsules once daily starting when his prednisone decreases to 20 mg daily.  Since we should avoid hospital contact for immunosuppressed patients if possible, it would be best not to do the IV iron treatment at this time. I recommend iron sulfate 325 mg, 1 tablet twice daily.

## 2018-06-01 NOTE — Telephone Encounter (Signed)
Patient called and wanted Colletta Maryland to be aware that his cell phone has been turned back on so he is now receiving calls.  Read My Chart communication Janene Madeira had with him.  Informed him Colletta [place] would like him to start a medication called Valcyte twice a day which would be covered under the ADAP program.  He would need to take is  one month or longer.  Also told him he would need to monitor his lab work while he was on this medication so we will set up lab appointments that he will have to come to.  Lastly we are working on getting him an eye exam  as well.  Makel verbalized understanding Informed Symir he would receive a call back with definite plans once Colletta [place] is made aware that he is available. Pricilla Riffle RN

## 2018-06-01 NOTE — Addendum Note (Signed)
Addended by: [place] Callas on: 06/01/2018 10:10 AM   Modules accepted: Orders

## 2018-06-01 NOTE — Telephone Encounter (Signed)
I spoke with Larry Lutz about treatment recommendations for him. We are going to start Valcyte 900 mg (2 tabs) twice a day. He is going to come in today for CBC and CMV PCR - appointment has been made (will be here at 1:45 pm and will need bus passes to return to work).  He will need a follow up lab appointment in 1 week to repeat labs. He will need an office visit with me in 1 month.   He confided to me that he has been taking his mothers mesalamine in addtion to his prednisone and has had cessation of bleeding. This AM he did have a bad bout of diarrhea and continues to be bloated.   He says he has had worsening eye vision/blurriness for about 3 months now. He will need urgent evaluation for ophthalmology to rule out CMV retinitis findings as this would warrant additional treatment on top of the valcyte.   He thanked me for my call and assures me he now has minutes on his phone to stay in communication. I asked him to please call his GI team as well to inform them of his medications and update them of his condition.   Larry Madeira, MSN, NP-C [facility] for Infectious Disease [email or web address] Pager: [phone] Office: [phone]

## 2018-06-01 NOTE — Progress Notes (Signed)
labs

## 2018-06-01 NOTE — Telephone Encounter (Signed)
Spoke with pt and reviewed instructions with pt and he verbalized understanding. Instructions sent to pt via mychart also. Apriso samples left up front for pt to pick up and start once on Predisone 36m dose. Pt aware.

## 2018-06-04 LAB — CMV (CYTOMEGALOVIRUS) DNA ULTRAQUANT, PCR
CMV DNA, QN PCR: 2.56 log IU/mL — ABNORMAL HIGH (ref <2.30)
CMV DNA, QN Real Time PCR: 363 IU/mL (ref <200)

## 2018-06-04 LAB — CBC
HCT: 28.6 % — ABNORMAL LOW (ref 38.5–50.0)
Hemoglobin: 8.6 g/dL — ABNORMAL LOW (ref 13.2–17.1)
MCH: 20.2 pg — ABNORMAL LOW (ref 27.0–33.0)
MCHC: 30.1 g/dL — AB (ref 32.0–36.0)
MCV: 67.3 fL — ABNORMAL LOW (ref 80.0–100.0)
MPV: 9.4 fL (ref 7.5–12.5)
Platelets: 463 10*3/uL — ABNORMAL HIGH (ref 140–400)
RBC: 4.25 10*6/uL (ref 4.20–5.80)
RDW: 15.4 % — ABNORMAL HIGH (ref 11.0–15.0)
WBC: 10.9 10*3/uL — AB (ref 3.8–10.8)

## 2018-06-08 ENCOUNTER — Other Ambulatory Visit: Payer: Self-pay | Admitting: *Deleted

## 2018-06-08 ENCOUNTER — Other Ambulatory Visit: Payer: Self-pay

## 2018-06-08 DIAGNOSIS — B259 Cytomegaloviral disease, unspecified: Principal | ICD-10-CM

## 2018-06-08 DIAGNOSIS — A0839 Other viral enteritis: Secondary | ICD-10-CM

## 2018-06-08 LAB — CBC
HCT: 27.5 % — ABNORMAL LOW (ref 38.5–50.0)
Hemoglobin: 8.3 g/dL — ABNORMAL LOW (ref 13.2–17.1)
MCH: 20.1 pg — ABNORMAL LOW (ref 27.0–33.0)
MCHC: 30.2 g/dL — ABNORMAL LOW (ref 32.0–36.0)
MCV: 66.6 fL — ABNORMAL LOW (ref 80.0–100.0)
MPV: 9.4 fL (ref 7.5–12.5)
Platelets: 523 10*3/uL — ABNORMAL HIGH (ref 140–400)
RBC: 4.13 10*6/uL — ABNORMAL LOW (ref 4.20–5.80)
RDW: 15.6 % — ABNORMAL HIGH (ref 11.0–15.0)
WBC: 16.1 10*3/uL — ABNORMAL HIGH (ref 3.8–10.8)

## 2018-06-11 ENCOUNTER — Other Ambulatory Visit: Payer: Self-pay | Admitting: Infectious Diseases

## 2018-06-11 DIAGNOSIS — D62 Acute posthemorrhagic anemia: Secondary | ICD-10-CM

## 2018-06-12 ENCOUNTER — Telehealth: Payer: Self-pay | Admitting: *Deleted

## 2018-06-12 NOTE — Telephone Encounter (Addendum)
Mychart message sent to patient to notify him of his appointment 4/2. Thank you! Landis Gandy, RN   ----- Message from [place] Callas, NP sent at 06/11/2018 11:00 AM EDT ----- Please call [facility] to schedule a follow up lab visit on Thursday April 2nd to re-check his blood counts while on Valcyte. His red blood cells/hemaglobin, while still low, are unchanged which is good news.

## 2018-06-14 ENCOUNTER — Other Ambulatory Visit: Payer: Self-pay

## 2018-06-14 MED ORDER — PREDNISONE 10 MG PO TABS: 30.0000 mg | ORAL_TABLET | Freq: Every day | ORAL | 0 refills | Status: DC

## 2018-06-15 ENCOUNTER — Other Ambulatory Visit: Payer: Self-pay

## 2018-06-15 ENCOUNTER — Ambulatory Visit (INDEPENDENT_AMBULATORY_CARE_PROVIDER_SITE_OTHER): Payer: Self-pay | Admitting: Internal Medicine

## 2018-06-15 ENCOUNTER — Encounter: Payer: Self-pay | Admitting: Internal Medicine

## 2018-06-15 ENCOUNTER — Telehealth: Payer: Self-pay | Admitting: Behavioral Health

## 2018-06-15 DIAGNOSIS — A0839 Other viral enteritis: Secondary | ICD-10-CM

## 2018-06-15 DIAGNOSIS — N5082 Scrotal pain: Secondary | ICD-10-CM

## 2018-06-15 DIAGNOSIS — D62 Acute posthemorrhagic anemia: Secondary | ICD-10-CM

## 2018-06-15 DIAGNOSIS — B259 Cytomegaloviral disease, unspecified: Principal | ICD-10-CM

## 2018-06-15 MED ORDER — FLUCONAZOLE 100 MG PO TABS: 100.0000 mg | ORAL_TABLET | Freq: Every day | ORAL | 1 refills | Status: DC

## 2018-06-15 NOTE — Progress Notes (Signed)
Patient Active Problem List   Diagnosis Date Noted  . Scrotal pain 06/15/2018    Priority: High  . Abdominal cramps 05/19/2018  . Homeless 05/19/2018  . Hematochezia 05/03/2018  . Neuropathy of right hand 05/11/2017  . Elevated cholesterol 02/25/2017  . High blood pressure 01/27/2017  . Human immunodeficiency virus (HIV) disease ([place]) 01/05/2017  . Depression with anxiety   . Ulcerative colitis, acute ([place]) 06/01/2016  . Gastrointestinal hemorrhage 06/01/2016  . Acute on chronic blood loss anemia 06/01/2016    Patient's Medications  New Prescriptions   FLUCONAZOLE (DIFLUCAN) 100 MG TABLET    Take 1 tablet (100 mg total) by mouth daily.  Previous Medications   BICTEGRAVIR-EMTRICITABINE-TENOFOVIR AF (BIKTARVY) 50-200-25 MG TABS TABLET    Take 1 tablet by mouth daily.   DICYCLOMINE (BENTYL) 20 MG TABLET    Take 1 tablet (20 mg total) by mouth 2 (two) times daily.   HYDROCODONE-ACETAMINOPHEN (NORCO/VICODIN) 5-325 MG TABLET    Take 1-2 tablets by mouth 2 (two) times daily as needed for moderate pain.   MESALAMINE (LIALDA) 1.2 G EC TABLET    Take 4 tablets (4.8 g total) by mouth daily with breakfast for 30 days.   PREDNISONE (DELTASONE) 10 MG TABLET    Take 3 tablets (30 mg total) by mouth daily with breakfast. And decrease as directed   VALGANCICLOVIR (VALCYTE) 450 MG TABLET    Take 2 tablets (900 mg total) by mouth 2 (two) times daily for 30 days.  Modified Medications   No medications on file  Discontinued Medications   No medications on file    Subjective: Larry Lutz is seen on a work in basis today.  He recently started on valganciclovir for CMV colitis.  Shortly after starting it he developed some itching around his neck and increasing redness and burning pain of his scrotum.  Review of Systems: Review of Systems  Constitutional: Negative for chills, diaphoresis and fever.  Genitourinary:       As noted in HPI.  Skin: Positive for itching. Negative for rash.     Past Medical History:  Diagnosis Date  . Anxiety   . Asthma   . Colitis   . Colon polyp   . Crohn disease ([place])   . Depressed   . GI bleed   . HIV (human immunodeficiency virus infection) ([place])   . Hypertension   . IBS (irritable bowel syndrome)   . UC (ulcerative colitis) ([place])     Social History   Tobacco Use  . Smoking status: Current Every Day Smoker  . Smokeless tobacco: Never Used  Substance Use Topics  . Alcohol use: No  . Drug use: Yes    Types: Marijuana    Family History  Problem Relation Age of Onset  . Ulcerative colitis Mother   . Irritable bowel syndrome Mother   . Prostate cancer Paternal Grandfather   . Diabetes Paternal Aunt   . Colon cancer Neg Hx   . Esophageal cancer Neg Hx   . Rectal cancer Neg Hx   . Stomach cancer Neg Hx     Allergies  Allergen Reactions  . Morphine And Related     Caused him to itch    Health Maintenance  Topic Date Due  . TETANUS/TDAP  04/14/2008  . INFLUENZA VACCINE  10/14/2018  . HIV Screening  Completed    Objective:  Vitals:   06/15/18 1341  BP: (!) 163/106  Pulse: 96  Weight:  161 lb (73 kg)   Body mass index is 25.99 kg/m.  Physical Exam Constitutional:      Comments: He is uncomfortable due to the scrotal pain but otherwise in good spirits.  HENT:     Mouth/Throat:     Pharynx: No oropharyngeal exudate.  Genitourinary:    Comments: His entire scrotum is red and slightly moist.  I do not detect any underlying testicular or epididymal lesions. Skin:    Findings: No rash.     Lab Results Lab Results  Component Value Date   WBC 16.1 (H) 06/08/2018   HGB 8.3 (L) 06/08/2018   HCT 27.5 (L) 06/08/2018   MCV 66.6 (L) 06/08/2018   PLT 523 (H) 06/08/2018    Lab Results  Component Value Date   CREATININE 0.92 05/17/2018   BUN 23 05/17/2018   NA 139 05/17/2018   K 4.2 05/17/2018   CL 103 05/17/2018   CO2 26 05/17/2018    Lab Results  Component Value Date   ALT 21 05/04/2018   AST 20  05/04/2018   ALKPHOS 71 05/04/2018   BILITOT 0.5 05/04/2018    Lab Results  Component Value Date   CHOL 236 (H) 01/05/2017   HDL 45 01/05/2017   LDLCALC 163 (H) 01/05/2017   TRIG 138 01/05/2017   CHOLHDL 5.2 (H) 01/05/2017   Lab Results  Component Value Date   LABRPR NON-REACTIVE 02/22/2018   HIV 1 RNA Quant (copies/mL)  Date Value  05/17/2018 83 (H)  02/22/2018 25 (H)  10/17/2017 <20 DETECTED   CD4 T Cell Abs (/uL)  Date Value  05/17/2018 470  02/22/2018 690  10/17/2017 970     Problem List Items Addressed This Visit      High   Scrotal pain    I suspect that he may have fungal infection of his scrotum.  I doubt this is an allergic, adverse reaction to valganciclovir.  I will treat him with fluconazole for now.      Relevant Medications   fluconazole (DIFLUCAN) 100 MG tablet        Michel Bickers, MD [facility] for Infectious [place] [phone] pager   [phone] cell 06/15/2018, 2:00 PM

## 2018-06-15 NOTE — Telephone Encounter (Signed)
Patient came in today for a lab visit and wanted to speak with a nurse.  Patient's complaint was painful, burning irritated scrotum.  Patient also complains of a mild rash on the right side of his neck that he states itches.  Patient has been taking Valcyte for CMV Colitis, for the last couple of weeks.  Also patient has been experiencing diarrhea that he attributes to food poisoning.  Informed Colletta Maryland, patient is to see Dr. Megan Salon today 06/15/2018. Pricilla Riffle RN

## 2018-06-15 NOTE — Assessment & Plan Note (Signed)
I suspect that he may have fungal infection of his scrotum.  I doubt this is an allergic, adverse reaction to valganciclovir.  I will treat him with fluconazole for now.

## 2018-06-16 LAB — CBC
HCT: 30.4 % — ABNORMAL LOW (ref 38.5–50.0)
Hemoglobin: 8.9 g/dL — ABNORMAL LOW (ref 13.2–17.1)
MCH: 19.3 pg — ABNORMAL LOW (ref 27.0–33.0)
MCHC: 29.3 g/dL — ABNORMAL LOW (ref 32.0–36.0)
MCV: 66.1 fL — ABNORMAL LOW (ref 80.0–100.0)
MPV: 10.5 fL (ref 7.5–12.5)
Platelets: 566 10*3/uL — ABNORMAL HIGH (ref 140–400)
RBC: 4.6 10*6/uL (ref 4.20–5.80)
RDW: 16 % — ABNORMAL HIGH (ref 11.0–15.0)
WBC: 11.6 10*3/uL — ABNORMAL HIGH (ref 3.8–10.8)

## 2018-06-16 LAB — CMV (CYTOMEGALOVIRUS) DNA ULTRAQUANT, PCR
CMV DNA, QN PCR: <2.3 log IU/mL (ref <2.30)
CMV DNA, QN Real Time PCR: <200 IU/mL (ref <200)

## 2018-06-19 NOTE — Progress Notes (Signed)
CMV levels are undetectable with 2 weeks of use. Hgb and other cell counts seem to be tolerating. Will repeat labs again at upcoming office visit. Will continue on suppression dose of valcyte after treatment.

## 2018-06-27 ENCOUNTER — Encounter: Payer: Self-pay | Admitting: Gastroenterology

## 2018-06-27 ENCOUNTER — Other Ambulatory Visit: Payer: Self-pay

## 2018-06-27 ENCOUNTER — Ambulatory Visit (INDEPENDENT_AMBULATORY_CARE_PROVIDER_SITE_OTHER): Payer: Self-pay | Admitting: Gastroenterology

## 2018-06-27 DIAGNOSIS — A0839 Other viral enteritis: Secondary | ICD-10-CM

## 2018-06-27 DIAGNOSIS — D509 Iron deficiency anemia, unspecified: Secondary | ICD-10-CM

## 2018-06-27 DIAGNOSIS — B259 Cytomegaloviral disease, unspecified: Secondary | ICD-10-CM

## 2018-06-27 DIAGNOSIS — K51919 Ulcerative colitis, unspecified with unspecified complications: Secondary | ICD-10-CM

## 2018-06-27 NOTE — Progress Notes (Signed)
This patient contacted our office requesting a physician telemedicine video consultation regarding clinical questions and/or test results. Interactive audio and video telecommunications were attempted between this provider and the patient.  However, this technology failed due to the patient having technical difficulties OR they did not have access to video capabilities.  We continued and completed the visit with audio only.   Participants on the phone : myself and patient   The patient consented to phone consultation and was aware that a charge will be placed through their insurance.  I was in my office and the patient was at work  He was unable to get away from work for video conference earlier today.  He then tried to connect this afternoon but was unable to do so due to technical problems.  He was talking to me during a very brief break he was allowed at work today.   Encounter time:  Total time 23 minutes, with 18 minutes spent with patient on phone/webex    Wilfrid Lund, MD   _____________________________________________________________________________________________               [facility] Progress Note  Chief Complaint: Ulcerative colitis  Subjective  History:  Larry Lutz has longstanding poorly controlled ulcerative colitis.  Recent emptied colonoscopy (poor preparation only allowed sigmoidoscopy) showed severe inflammation and biopsy showed CMV colitis.  After considerable effort, he ultimately did see infectious disease and follow-up and they started him on oral ganciclovir.  He has taken it faithfully, but does not know how long he should be on it. He has "up and down days" with abdominal pain.  He has 2-4 semi-formed stools per day.  There is been no blood for at least several weeks.  He is also still taking prednisone 30 mg a day.  He has not yet come to our office to pick up the samples of mesalamine in order to taper down his prednisone as described on 06/01/2018. He  was also noted to be persistently anemic on labs on 06/15/2018 with a hemoglobin of 8.9 and a low MCV.  It was felt unwise to bring him into a hospital setting for IV iron because he is immunocompromised, and oral iron was recommended.  It does not sound like he has been taking that yet.  ROS: Cardiovascular:  no chest pain Respiratory: no dyspnea  The patient's Past Medical, Family and Social History were reviewed and are on file in the EMR.  Objective:  Med list reviewed  Current Outpatient Medications:  .  bictegravir-emtricitabine-tenofovir AF (BIKTARVY) 50-200-25 MG TABS tablet, Take 1 tablet by mouth daily., Disp: 30 tablet, Rfl: 5 .  dicyclomine (BENTYL) 20 MG tablet, Take 1 tablet (20 mg total) by mouth 2 (two) times daily. (Patient not taking: Reported on 05/22/2018), Disp: 60 tablet, Rfl: 0 .  fluconazole (DIFLUCAN) 100 MG tablet, Take 1 tablet (100 mg total) by mouth daily., Disp: 14 tablet, Rfl: 1 .  HYDROcodone-acetaminophen (NORCO/VICODIN) 5-325 MG tablet, Take 1-2 tablets by mouth 2 (two) times daily as needed for moderate pain., Disp: 30 tablet, Rfl: 0 .  mesalamine (LIALDA) 1.2 g EC tablet, Take 4 tablets (4.8 g total) by mouth daily with breakfast for 30 days. (Patient not taking: Reported on 05/16/2018), Disp: 120 tablet, Rfl: 0 .  predniSONE (DELTASONE) 10 MG tablet, Take 3 tablets (30 mg total) by mouth daily with breakfast. And decrease as directed, Disp: 100 tablet, Rfl: 0 .  valGANciclovir (VALCYTE) 450 MG tablet, Take 2 tablets (900 mg total) by  mouth 2 (two) times daily for 30 days., Disp: 120 tablet, Rfl: 0     Recent Labs:  Colon biopsies showing CMV colitis as previously noted.  CBC Latest Ref Rng & Units 06/15/2018 06/08/2018 06/01/2018  WBC 3.8 - 10.8 Thousand/uL 11.6(H) 16.1(H) 10.9(H)  Hemoglobin 13.2 - 17.1 g/dL 8.9(L) 8.3(L) 8.6(L)  Hematocrit 38.5 - 50.0 % 30.4(L) 27.5(L) 28.6(L)  Platelets 140 - 400 Thousand/uL 566(H) 523(H) 463(H)        @ASSESSMENTPLANBEGIN @ Assessment: Encounter Diagnoses  Name Primary?  . Ulcerative colitis with complication, unspecified location ([place]) Yes  . Iron deficiency anemia, unspecified iron deficiency anemia type   . 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  He lacks insurance is not getting regular primary care or GI care since being released from incarceration.  This will make it challenging for him to take the medicines he needs to keep this colitis under control in the long run.  Plan:  I told him to decrease prednisone to 20 mg a day starting tomorrow.  In a week, he then will go down to 10 mg a day.  A week later, down to 5 mg a day and then stop.  Come to our office this week to pick up the samples of mesalamine (Lialda) with instructions as given previously.  We will contact pharmaceutical providers for mesalamine and also vedolizumab (which would be the ideal medicine for him) to see what programs they might have available for uninsured patients.  Larry Lutz says that his job does not [employer], and he was told that he does not qualify for Medicaid.  I would like him to start iron sulfate 325 mg twice daily.  My note will be copied to the infectious disease clinic provider to help coordinate this patient's care, and also to better understand how long he is likely to be on ganciclovir.  He needs an in person clinic visit with me in mid to late May, though this is potentially subject to change based on COVID restrictions that may continue to be necessary in our community.   Nelida Meuse III

## 2018-06-27 NOTE — Progress Notes (Signed)
Thank you - I have an e visit with him arranged with me also at the 4 week mark (usually 4-6 weeks of treatment recommended). It may be beneficial to go on with prophylactic dosing after treatment course considering he continues on prednisone.   Will discuss with Dr. Linus Salmons about what he thinks about this plan and discuss further with [place].   Appreciate your care.

## 2018-06-29 ENCOUNTER — Ambulatory Visit: Payer: Self-pay | Admitting: Gastroenterology

## 2018-06-30 NOTE — Progress Notes (Signed)
Sent pt message that Dr. Loletha Carrow would like him to start on Uceris. Have application form for patient assistance and asked pt to let us know if he wants to come and pick up the form or if he wants them mailed to him. WIll wait to hear back from pt.

## 2018-07-06 ENCOUNTER — Telehealth: Payer: Self-pay | Admitting: Gastroenterology

## 2018-07-06 NOTE — Progress Notes (Signed)
Pt sent the fax number today via mychart. Pt assistance application faxed to number and requested pt either mail them back or fax them back with all requested documents.

## 2018-07-06 NOTE — Telephone Encounter (Signed)
Papers were faxed per pt request.

## 2018-07-06 NOTE — Progress Notes (Signed)
Have communicated with pt via mychart and asked if he wanted to pick up the pt assistance forms for Uceris or have them mailed. Pt replied asking if I could fax them and I replied back "yes if you will provide a fax number." Then received message back to call him. Call placed to patient with no answer, left him a voicemail to call back and also sent him a message in my chart that this RN just tried to reach him and left him a message on his phone.

## 2018-07-06 NOTE — Telephone Encounter (Signed)
Pt called to inform that his fax number is [phone], he is requesting if papers can be faxed asap because that is the fax at his work.

## 2018-07-07 ENCOUNTER — Ambulatory Visit: Payer: Self-pay | Admitting: Gastroenterology

## 2018-07-10 ENCOUNTER — Telehealth: Payer: Self-pay

## 2018-07-10 NOTE — Telephone Encounter (Signed)
Doran Stabler, MD  Algernon Huxley, RN        Please see my note and call him to review treatment instructions.   As best we can, we need to explore options for him to get reduced cost or free medicine through a drug company. Ideally vedolizumab, but I will settle for Uceris or mesalamine (mesalamine less desirable since less likely than others to give long term remission for his degree of colitis).

## 2018-07-10 NOTE — Telephone Encounter (Signed)
-----   Message from [place], MD sent at 06/27/2018  4:42 PM EDT ----- Please see my note and call him to review treatment instructions.  As best we can, we need to explore options for him to get reduced cost or free medicine through a drug company.  Ideally vedolizumab, but I will settle for Uceris or mesalamine (mesalamine less desirable since less likely than others to give long term remission for his degree of colitis).

## 2018-07-10 NOTE — Telephone Encounter (Signed)
Left message on machine to call back  

## 2018-07-11 ENCOUNTER — Ambulatory Visit: Payer: Self-pay | Admitting: Infectious Diseases

## 2018-07-13 NOTE — Telephone Encounter (Signed)
Left message on machine to call back  

## 2018-07-17 ENCOUNTER — Other Ambulatory Visit: Payer: Self-pay

## 2018-07-17 MED ORDER — PREDNISONE 10 MG PO TABS: ORAL_TABLET | ORAL | 0 refills | Status: DC

## 2018-07-17 NOTE — Telephone Encounter (Signed)
Communicated with pt via mychart.

## 2018-07-17 NOTE — Telephone Encounter (Signed)
I understand he is having a very difficult time with the colitis.    There is a limit to what I can do since he currently has no means of getting medicines that I feel would be helpful (such as Uceris or possibly vedolizumab).  As such, I do not feel I can complete FMLA paperwork (if that is the paperwork to which you refer) since I cannot comment on the long term prognosis and do not know what his treatment can look like.  It does not appear he has worked through the financial process with [place] to help get care.  As I have told him before, prednisone is not a long term solution to this.  That said, I am willing to prescribe prednisone for the next 2 months only.  In that time, he needs a solution to care coverage.    Please send a prescription for sufficient supply of prednisone 10 mg tablets so he can take 20 mg once daily for 6 weeks, then 10 mg daily for a week, then 5 mg daily for a week, then stop.  He must continue the antiviral therapy with infectious disease clinic per their recommendations.  I am not prescribing pain medicines.

## 2018-07-18 ENCOUNTER — Other Ambulatory Visit: Payer: Self-pay | Admitting: Infectious Diseases

## 2018-07-18 DIAGNOSIS — Z21 Asymptomatic human immunodeficiency virus [HIV] infection status: Secondary | ICD-10-CM

## 2018-07-18 DIAGNOSIS — B259 Cytomegaloviral disease, unspecified: Principal | ICD-10-CM

## 2018-07-18 DIAGNOSIS — A0839 Other viral enteritis: Secondary | ICD-10-CM

## 2018-07-18 MED ORDER — VALGANCICLOVIR HCL 450 MG PO TABS: 900.0000 mg | ORAL_TABLET | Freq: Every day | ORAL | 2 refills | Status: DC

## 2018-08-01 ENCOUNTER — Telehealth: Payer: Self-pay

## 2018-08-01 NOTE — Telephone Encounter (Signed)
Covid-19 travel screening questions  Have you traveled in the last 14 days? NO If yes where?  Do you now or have you had a fever in the last 14 days? NO  Do you have any respiratory symptoms of shortness of breath or cough now or in the last 14 days? NO  Do you have a medical history of Congestive Heart Failure? NO  Do you have a medical history of lung disease? NO  Do you have any family members or close contacts with diagnosed or suspected Covid-19? NO

## 2018-08-02 ENCOUNTER — Other Ambulatory Visit: Payer: Self-pay

## 2018-08-02 ENCOUNTER — Encounter: Payer: Self-pay | Admitting: Gastroenterology

## 2018-08-02 ENCOUNTER — Ambulatory Visit (INDEPENDENT_AMBULATORY_CARE_PROVIDER_SITE_OTHER): Payer: Self-pay | Admitting: Gastroenterology

## 2018-08-02 VITALS — BP 140/90 | HR 72 | Ht 66.0 in | Wt 156.0 lb

## 2018-08-02 DIAGNOSIS — K625 Hemorrhage of anus and rectum: Secondary | ICD-10-CM

## 2018-08-02 DIAGNOSIS — B259 Cytomegaloviral disease, unspecified: Secondary | ICD-10-CM

## 2018-08-02 DIAGNOSIS — A0839 Other viral enteritis: Secondary | ICD-10-CM

## 2018-08-02 DIAGNOSIS — B20 Human immunodeficiency virus [HIV] disease: Secondary | ICD-10-CM

## 2018-08-02 DIAGNOSIS — K51919 Ulcerative colitis, unspecified with unspecified complications: Secondary | ICD-10-CM

## 2018-08-02 MED ORDER — MESALAMINE 400 MG PO CPDR: 800.0000 mg | DELAYED_RELEASE_CAPSULE | Freq: Three times a day (TID) | ORAL | 6 refills | Status: DC

## 2018-08-02 NOTE — Progress Notes (Signed)
[facility] Progress Note  Chief Complaint: Ulcerative pancolitis  Subjective  History:  Larry Lutz was seen in follow-up for his ulcerative colitis, which is the first time I have seen him since his colonoscopy on 05/22/2018.  Unfortunately, he did not take his bowel preparation, thus he had much formed stool in the colon precluding more than examination of just the rectum and distal sigmoid.  That showed a severe colitis with ulceration, and biopsy showed CMV.  There has been great difficulty coordinating and managing his testing and treatment because he has often been difficult to reach by phone, reluctant to come for office visits, and making specific demands about his therapy such as ongoing prednisone as well as request for pain medicine. I originally felt he should be admitted for IV therapy of the CMV.  Infectious disease clinic was very helpful in putting together oral treatment regimen and following up with him on this.  He remains on ganciclovir, but is not clear to me what the duration or endpoint of therapy would be.  We tried to provide samples of mesalamine for Larry Lutz but he did not come by the office to get them on multiple occasions.  He apparently filled out some paperwork in hopes of getting reduced cost medicine from 1 of the drug companies, process which we also assisted with, but he does not believe he has received a reply. He does not have medical coverage, and says that he has been told he does not qualify for Medicaid.  This has made it especially difficult to design a treatment regimen for him.  Today, it was difficult to communicate with him because he was distracted and reluctant to talk to me.  He said he was aggravated that he had to come for a visit at all, and wanted to know why we could not do another phone visit, such as we had done several weeks ago.  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  I also had not examine him in some time.  Nevertheless, he was reluctant to interact with me today.  He was constantly on his phone, said his friends were only briefly waiting in the parking lot he was afraid they would leave, and he was late for work. He also felt that since I was not willing to prescribe him more prednisone nor pain medications, he did not "see the point" in a visit today. [name] has had several weeks of throat discomfort and says he is bringing up some mucus.  He feels his throat is swollen and it is sometimes difficult to swallow.  As near as I can tell, he has having 3-4 bowel movements per day that are semi-formed, he has not taking any antidiarrheal medicines.  When I asked if there is bleeding, he says "some". He also reports frequent abdominal pain and again requested pain medicine.  ROS: Cardiovascular:  no chest pain Respiratory: no dyspnea Throat symptoms as noted above Remainder of systems were attempted but patient did not want a participate in an extended history today.  The patient's Past Medical, Family and Social History were reviewed and are on file in the EMR.  Larry Lutz smells strongly of marijuana, said he used to just before coming into the office, that he does so daily and has done so for many years with no intention of stopping.  He also smokes cigarettes.  Objective:  Med list reviewed  Current Outpatient Medications:  .  bictegravir-emtricitabine-tenofovir AF (BIKTARVY) 50-200-25 MG TABS tablet, Take 1 tablet by mouth daily., Disp: 30 tablet, Rfl: 5 .  dicyclomine (BENTYL) 20 MG tablet, Take 1 tablet (20 mg total) by mouth 2 (two) times daily., Disp: 60 tablet, Rfl: 0 .  fluconazole (DIFLUCAN) 100 MG tablet, Take 1 tablet (100 mg total) by mouth daily., Disp: 14 tablet, Rfl: 1 .  HYDROcodone-acetaminophen (NORCO/VICODIN) 5-325 MG tablet, Take 1-2 tablets by mouth 2 (two) times daily as needed for moderate pain., Disp: 30 tablet, Rfl: 0  .  valGANciclovir (VALCYTE) 450 MG tablet, Take 2 tablets (900 mg total) by mouth daily., Disp: 60 tablet, Rfl: 2 .  Mesalamine (ASACOL) 400 MG CPDR DR capsule, Take 2 capsules (800 mg total) by mouth 3 (three) times daily., Disp: 180 capsule, Rfl: 6 .  mesalamine (LIALDA) 1.2 g EC tablet, Take 4 tablets (4.8 g total) by mouth daily with breakfast for 30 days. (Patient not taking: Reported on 05/16/2018), Disp: 120 tablet, Rfl: 0 .  predniSONE (DELTASONE) 10 MG tablet, Take 3 tablets (30 mg total) by mouth daily with breakfast. And decrease as directed (Patient not taking: Reported on 08/02/2018), Disp: 100 tablet, Rfl: 0 .  predniSONE (DELTASONE) 10 MG tablet, Take 2 tablets by mouth daily for 6 weeks, take 1 tablet daily for 7 days, take 1/2 tab daily for 7 days then stop. (Patient not taking: Reported on 08/02/2018), Disp: 100 tablet, Rfl: 0   Vital signs in last 24 hrs: Vitals:   08/02/18 1042  BP: 140/90  Pulse: 72    Physical Exam  He appears well-hydrated.  Defensive and distracted affect.  HEENT: sclera anicteric, oral mucosa moist without lesions -no thrush seen  Neck: supple, no thyromegaly, JVD or lymphadenopathy  Cardiac: RRR without murmurs, S1S2 heard, no peripheral edema  Pulm: clear to auscultation bilaterally, normal RR and effort noted  Abdomen: soft, no tenderness, with active bowel sounds. No guarding or palpable hepatosplenomegaly.  Skin; warm and dry, no jaundice or rash    @ASSESSMENTPLANBEGIN @ Assessment: Encounter Diagnoses  Name Primary?  . Ulcerative colitis with complication, unspecified location ([place]) Yes  . 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 Rectal bleeding   . HIV disease ([place])    Larry Lutz has longstanding poorly controlled ulcerative colitis.  It is difficult to know how much of the the severity recently seen on limited lower scope is related to the CMV.  Unfortunately, he was unwilling to take the required bowel preparation, so his exam was limited.  He is not  willing to take a bowel preparation, so I do not know how I could reasonably reassess his disease activity.  If it was still severe and CMV was gone, he would need therapy like vedolizumab.  Unfortunately, he has no medical coverage, does not seem to have made progress obtaining any in order to get medicines, and thus I do not know how to adequately provide therapy this condition needs. I did my best to explain to him why prednisone is not the long-term solution, including its risks of both short and long-term side effects, and its risk of immunosuppression in a patient who has HIV.  He was upset to hear that, felt I did not care about his condition, and said that his previous GI doctors had kept him on that therapy whenever he asked.  Demetrios has been taking his mother's Apriso, since she reportedly has ulcerative colitis.  We gave the  small supply of that medicine that we had as samples, and then I prescribed him Delzicol 3 times daily in hopes that it might be affordable.  I also highly recommend he not take another person's medication.  Regarding his throat symptoms, I highly recommended that he seek urgent care or emergency department evaluation for this, as he could have an infection like strep, candidiasis or other. He was upset there was nothing I could do to test for that here, and said he would not do that because he had to go to work.  He was then agreeable to taking the medicine samples and left the office, saying he did not plan to see me again.  Bascom's psychosocial issues have been a great obstacle to treatment of his colitis.  Total time 20 minutes, over half spent in direct face-to-face visit with patient.  Nelida Meuse III

## 2018-08-02 NOTE — Patient Instructions (Signed)
If you are age 29 or older, your body mass index should be between 23-30. Your Body mass index is 25.18 kg/m. If this is out of the aforementioned range listed, please consider follow up with your Primary Care Provider.  If you are age 64 or younger, your body mass index should be between 19-25. Your Body mass index is 25.18 kg/m. If this is out of the aformentioned range listed, please consider follow up with your Primary Care Provider.   We sent a medication to your pharmacy for you to pick up at your convenience.    It was a pleasure to see you today!  Dr. Loletha Carrow

## 2018-08-02 NOTE — Telephone Encounter (Signed)
Patient called to follow up on his mychart messages soon after he sent them.  RN encouraged patient to go to [facility] or urgent for strep throat testing, to try over the counter allergy medicine for drainage from what he thinks might be allergies.  He is due for labs, visit.  I asked him to call back next week once he knows his schedule. Landis Gandy, RN

## 2018-08-04 ENCOUNTER — Ambulatory Visit: Payer: Self-pay | Admitting: Gastroenterology

## 2018-08-15 ENCOUNTER — Other Ambulatory Visit: Payer: Self-pay | Admitting: Infectious Diseases

## 2018-08-15 ENCOUNTER — Other Ambulatory Visit: Payer: Self-pay

## 2018-08-15 DIAGNOSIS — B20 Human immunodeficiency virus [HIV] disease: Secondary | ICD-10-CM

## 2018-08-15 DIAGNOSIS — Z21 Asymptomatic human immunodeficiency virus [HIV] infection status: Secondary | ICD-10-CM

## 2018-08-15 DIAGNOSIS — A0839 Other viral enteritis: Secondary | ICD-10-CM

## 2018-08-16 LAB — T-HELPER CELL (CD4) - (RCID CLINIC ONLY)
CD4 % Helper T Cell: 32 % — ABNORMAL LOW (ref 33–65)
CD4 T Cell Abs: 701 /uL (ref 400–1790)

## 2018-08-22 LAB — CBC
HCT: 38.6 % (ref 38.5–50.0)
Hemoglobin: 10.7 g/dL — ABNORMAL LOW (ref 13.2–17.1)
MCH: 16.8 pg — ABNORMAL LOW (ref 27.0–33.0)
MCHC: 27.7 g/dL — ABNORMAL LOW (ref 32.0–36.0)
MCV: 60.7 fL — ABNORMAL LOW (ref 80.0–100.0)
MPV: 9.6 fL (ref 7.5–12.5)
Platelets: 551 10*3/uL — ABNORMAL HIGH (ref 140–400)
RBC: 6.36 10*6/uL — ABNORMAL HIGH (ref 4.20–5.80)
RDW: 18.5 % — ABNORMAL HIGH (ref 11.0–15.0)
WBC: 9.4 10*3/uL (ref 3.8–10.8)

## 2018-08-22 LAB — COMPLETE METABOLIC PANEL WITH GFR
AG Ratio: 1.4 (calc) (ref 1.0–2.5)
ALT: 12 U/L (ref 9–46)
AST: 20 U/L (ref 10–40)
Albumin: 4.7 g/dL (ref 3.6–5.1)
Alkaline phosphatase (APISO): 79 U/L (ref 36–130)
BUN: 15 mg/dL (ref 7–25)
CO2: 29 mmol/L (ref 20–32)
Calcium: 10.5 mg/dL — ABNORMAL HIGH (ref 8.6–10.3)
Chloride: 103 mmol/L (ref 98–110)
Creat: 1.15 mg/dL (ref 0.60–1.35)
GFR, Est African American: 99 mL/min/{1.73_m2} (ref >=60)
GFR, Est Non African American: 86 mL/min/{1.73_m2} (ref >=60)
Globulin: 3.3 g/dL (calc) (ref 1.9–3.7)
Glucose, Bld: 60 mg/dL — ABNORMAL LOW (ref 65–99)
Potassium: 4.5 mmol/L (ref 3.5–5.3)
Sodium: 141 mmol/L (ref 135–146)
Total Bilirubin: 0.6 mg/dL (ref 0.2–1.2)
Total Protein: 8 g/dL (ref 6.1–8.1)

## 2018-08-22 LAB — HIV-1 RNA QUANT-NO REFLEX-BLD
HIV 1 RNA Quant: 176 copies/mL — ABNORMAL HIGH
HIV-1 RNA Quant, Log: 2.25 Log copies/mL — ABNORMAL HIGH

## 2018-08-22 LAB — CMV (CYTOMEGALOVIRUS) DNA ULTRAQUANT, PCR
CMV DNA, QN PCR: <2.3 log IU/mL (ref <2.30)
CMV DNA, QN Real Time PCR: <200 IU/mL (ref <200)

## 2018-09-04 ENCOUNTER — Telehealth: Payer: Self-pay | Admitting: Infectious Diseases

## 2018-09-04 NOTE — Telephone Encounter (Signed)
COVID-19 Pre-Screening Questions:  Do you currently have a fever (>100 F), chills or unexplained body aches? No    Are you currently experiencing new cough, shortness of breath, sore throat, runny nose? No     Have you recently travelled outside the state of [place] in the last 14 days? no   1. Have you been in contact with someone that is currently pending confirmation of Covid19 testing or has been confirmed to have the [place] virus?  No

## 2018-09-05 ENCOUNTER — Ambulatory Visit: Payer: Self-pay | Admitting: Infectious Diseases

## 2018-11-06 ENCOUNTER — Telehealth: Payer: Self-pay | Admitting: Infectious Diseases

## 2018-11-06 NOTE — Telephone Encounter (Signed)
COVID-19 Pre-Screening Questions: ° °Do you currently have a fever (>100 °F), chills or unexplained body aches? N ° °Are you currently experiencing new cough, shortness of breath, sore throat, runny nose? N °•  °Have you recently travelled outside the state of [place] in the last 14 days? N  °•  °Have you been in contact with someone that is currently pending confirmation of Covid19 testing or has been confirmed to have the Covid19 virus?  N ° °**If the patient answers NO to ALL questions -  advise the patient to please call the clinic before coming to the office should any symptoms develop.  ° ° ° °

## 2018-11-07 ENCOUNTER — Ambulatory Visit: Payer: Self-pay | Admitting: Infectious Diseases

## 2018-11-29 ENCOUNTER — Ambulatory Visit (INDEPENDENT_AMBULATORY_CARE_PROVIDER_SITE_OTHER)
Admission: RE | Admit: 2018-11-29 | Discharge: 2018-11-29 | Disposition: A | Discharge status: Home / Self Care | Payer: Self-pay | Source: Ambulatory Visit

## 2018-11-29 NOTE — ED Provider Notes (Signed)
Patient mistakenly scheduled appt with UC.  He meant to schedule with his GI specialist.  Instructed patient to go to the ED if he is actively bleeding.     Sharion Balloon, NP 11/29/18 1505

## 2018-12-07 ENCOUNTER — Encounter: Payer: Self-pay | Admitting: Infectious Diseases

## 2018-12-07 ENCOUNTER — Ambulatory Visit (INDEPENDENT_AMBULATORY_CARE_PROVIDER_SITE_OTHER): Payer: Self-pay | Admitting: Infectious Diseases

## 2018-12-07 ENCOUNTER — Other Ambulatory Visit: Payer: Self-pay | Admitting: Infectious Diseases

## 2018-12-07 ENCOUNTER — Other Ambulatory Visit: Payer: Self-pay

## 2018-12-07 VITALS — BP 176/126 | HR 112 | Temp 98.3°F

## 2018-12-07 DIAGNOSIS — I10 Essential (primary) hypertension: Secondary | ICD-10-CM

## 2018-12-07 DIAGNOSIS — K51811 Other ulcerative colitis with rectal bleeding: Secondary | ICD-10-CM

## 2018-12-07 DIAGNOSIS — F418 Other specified anxiety disorders: Secondary | ICD-10-CM

## 2018-12-07 DIAGNOSIS — Z23 Encounter for immunization: Secondary | ICD-10-CM

## 2018-12-07 DIAGNOSIS — B259 Cytomegaloviral disease, unspecified: Secondary | ICD-10-CM

## 2018-12-07 DIAGNOSIS — Z202 Contact with and (suspected) exposure to infections with a predominantly sexual mode of transmission: Secondary | ICD-10-CM

## 2018-12-07 DIAGNOSIS — A0839 Other viral enteritis: Secondary | ICD-10-CM

## 2018-12-07 DIAGNOSIS — Z113 Encounter for screening for infections with a predominantly sexual mode of transmission: Secondary | ICD-10-CM

## 2018-12-07 DIAGNOSIS — B20 Human immunodeficiency virus [HIV] disease: Secondary | ICD-10-CM

## 2018-12-07 MED ORDER — VALGANCICLOVIR HCL 450 MG PO TABS: 900.0000 mg | ORAL_TABLET | Freq: Every day | ORAL | 2 refills | Status: DC

## 2018-12-07 MED ORDER — CEFTRIAXONE SODIUM 250 MG IJ SOLR
250.0000 mg | Freq: Once | INTRAMUSCULAR | Status: AC
  Administered 2018-12-07: 250 mg via INTRAMUSCULAR

## 2018-12-07 MED ORDER — LISINOPRIL 10 MG PO TABS: 10.0000 mg | ORAL_TABLET | Freq: Every day | ORAL | 1 refills | Status: DC

## 2018-12-07 MED ORDER — ABACAVIR-DOLUTEGRAVIR-LAMIVUD 600-50-300 MG PO TABS: 1.0000 | ORAL_TABLET | Freq: Every day | ORAL | 5 refills | Status: DC

## 2018-12-07 MED ORDER — AZITHROMYCIN 250 MG PO TABS
1000.0000 mg | ORAL_TABLET | Freq: Once | ORAL | Status: AC
  Administered 2018-12-07: 1000 mg via ORAL

## 2018-12-07 NOTE — Patient Instructions (Addendum)
Stop your Biktarvy and start taking Triumeq once a day.   Start your Valcyte twice a day again until we see you next month.   Please also start taking your lisinopril for your blood pressure, once a day.   I would like for you to to try to for your cough/congestion Zyrtec once a day, and Flonase nasal spray to each nostril.   Please call the GI team again to see if you can get an appointment back. The valcyte is not likely going to fix all the problems with the bleeding. You need more care from their team.   Please come back in 4 weeks - look forward to seeing you!

## 2018-12-07 NOTE — Progress Notes (Signed)
Patient Name: Larry Lutz  DOB: [birth year]  MRN: [id number]    Patient Active Problem List   Diagnosis Date Noted  . 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 Abdominal cramps 05/19/2018  . Hematochezia 05/03/2018  . Neuropathy of right hand 05/11/2017  . Elevated cholesterol 02/25/2017  . Possible exposure to STD 01/27/2017  . High blood pressure 01/27/2017  . Human immunodeficiency virus (HIV) disease ([place]) 01/05/2017  . Depression with anxiety   . Ulcerative colitis ([place]) 06/01/2016   SUBJECTIVE: Brief Narrative:  Byran is a 28 y.o. AA male with HIV infection. Originally diagnosed with HIV in 2017 in [place] (no records available). Never on medications in the past but had initially undetectable VL until recently without explanation. HIV Risk: MSM History of OIs: none  Previous Regimen:   Biktarvy >> suppressed   Genotype:   wild type 12/2016   CC: HIV follow up care. Still bleeding from rectum with diarrhea. Dysuria symptoms with hematuria x 2 months - requesting STD treatment. Not taking his Biktarvy routinely. Lots of anxiety.    HPI:  Larry Lutz comes in today after several missed appointments with several things he needs addressed.  First off is not taking his Biktarvy regularly and is requesting a new pill to treat his HIV.  He would like to go back on Triumeq.  While this pill is larger he felt like he was more "motivated to take it" and "it worked better for him."  He has not consistently taken it since April since our last visit together.  He has been having unprotected sex with other HIV-positive male partners but has had to refrain from " bottom position" due to his ongoing bleeding.  He says he feels masses in his rectum.  He continues to douche routinely to help "clean himself out."  He has not been back to see his GI team.   He has dysuria with blood noted in urine.  He notes some urine hesitancy and feels as if sometimes he has trouble holding onto his bowels when he tries  to pass urine.  He does have urethral discharge.   He also mentions some central chest burning when he swallows.  He has had no trouble with cough or reflux symptoms aside from this.  He has not been taking his Valcyte.  Review of Systems  All other systems reviewed and are negative. 12-point review   Past Medical History:  Diagnosis Date  . Anxiety   . Asthma   . Colitis   . Colon polyp   . Crohn disease ([place])   . Depressed   . GI bleed   . HIV (human immunodeficiency virus infection) ([place])   . Hypertension   . IBS (irritable bowel syndrome)   . UC (ulcerative colitis) ([facility])    Allergies  Allergen Reactions  . Morphine And Related     Caused him to itch     Outpatient Medications Prior to Visit  Medication Sig Dispense Refill  . dicyclomine (BENTYL) 20 MG tablet Take 1 tablet (20 mg total) by mouth 2 (two) times daily. 60 tablet 0  . fluconazole (DIFLUCAN) 100 MG tablet Take 1 tablet (100 mg total) by mouth daily. 14 tablet 1  . HYDROcodone-acetaminophen (NORCO/VICODIN) 5-325 MG tablet Take 1-2 tablets by mouth 2 (two) times daily as needed for moderate pain. 30 tablet 0  . Mesalamine (ASACOL) 400 MG CPDR DR capsule Take 2 capsules (800 mg total) by mouth 3 (three) times daily. 180 capsule 6  .  predniSONE (DELTASONE) 10 MG tablet Take 3 tablets (30 mg total) by mouth daily with breakfast. And decrease as directed 100 tablet 0  . predniSONE (DELTASONE) 10 MG tablet Take 2 tablets by mouth daily for 6 weeks, take 1 tablet daily for 7 days, take 1/2 tab daily for 7 days then stop. 100 tablet 0  . valGANciclovir (VALCYTE) 450 MG tablet Take 2 tablets (900 mg total) by mouth daily. 60 tablet 2  . bictegravir-emtricitabine-tenofovir AF (BIKTARVY) 50-200-25 MG TABS tablet Take 1 tablet by mouth daily. (Patient not taking: Reported on 12/07/2018) 30 tablet 5  . mesalamine (LIALDA) 1.2 g EC tablet Take 4 tablets (4.8 g total) by mouth daily with breakfast for 30 days. (Patient not  taking: Reported on 05/16/2018) 120 tablet 0   No facility-administered medications prior to visit.     Social History   Tobacco Use  . Smoking status: Current Every Day Smoker  . Smokeless tobacco: Never Used  Substance Use Topics  . Alcohol use: No  . Drug use: Yes    Types: Marijuana    Physical Exam and Objective Findings:  Vitals:   12/07/18 1050  BP: (!) 176/126  Pulse: (!) 112  Temp: 98.3 F (36.8 C)   There is no height or weight on file to calculate BMI.  Physical Exam  Constitutional: He is oriented to person, place, and time.  Seated comfortably in chair. Dressed nicely - going for a job interview after this.   HENT:  Mouth/Throat: No oropharyngeal exudate.  Eyes: Pupils are equal, round, and reactive to light. No scleral icterus.  Cardiovascular: Regular rhythm and normal heart sounds. Tachycardia present.  Pulmonary/Chest: Effort normal and breath sounds normal. No respiratory distress.  Abdominal: Soft. He exhibits no distension. There is no abdominal tenderness.  Neurological: He is alert and oriented to person, place, and time.  Skin: Skin is warm and dry. No rash noted.  Psychiatric: His mood appears anxious. He exhibits disordered thought content.  Vitals reviewed.   Lab Results Lab Results  Component Value Date   WBC 9.4 08/15/2018   HGB 10.7 (L) 08/15/2018   HCT 38.6 08/15/2018   MCV 60.7 (L) 08/15/2018   PLT 551 (H) 08/15/2018    Lab Results  Component Value Date   CREATININE 1.15 08/15/2018   BUN 15 08/15/2018   NA 141 08/15/2018   K 4.5 08/15/2018   CL 103 08/15/2018   CO2 29 08/15/2018    Lab Results  Component Value Date   ALT 12 08/15/2018   AST 20 08/15/2018   ALKPHOS 71 05/04/2018   BILITOT 0.6 08/15/2018    Lab Results  Component Value Date   CHOL 236 (H) 01/05/2017   HDL 45 01/05/2017   LDLCALC 163 (H) 01/05/2017   TRIG 138 01/05/2017   CHOLHDL 5.2 (H) 01/05/2017   HIV 1 RNA Quant (copies/mL)  Date Value   08/15/2018 176 (H)  05/17/2018 83 (H)  02/22/2018 25 (H)   CD4 T Cell Abs (/uL)  Date Value  08/15/2018 701  05/17/2018 470  02/22/2018 690   ASSESSMENT & PLAN:    Problem List Items Addressed This Visit      Unprioritized   Human immunodeficiency virus (HIV) disease ([place]) - Primary (Chronic)    Reviewed his previous viral loads and explained that his poor adherence is causing his virus to get out of control again. He would like to go back to taking his Triumeq citing several reasons why but felt ultimately "  it worked better." If he has a perceived benefit and can get motivated to take any treatment consistently we can make the change; will have him stop the [place] and start taking Triumeq every day with or without food. Counseled on avoiding co-administration of MVIs/supplements, Tums/Rolaids, etc.  He is having unprotected sex and concerned about STIs again with urethral discharge/painful urination etc. Counseled on safe sex especially in the setting of poor medication adherence.  Return in about 4 weeks (around 01/04/2019).       Relevant Medications   abacavir-dolutegravir-lamiVUDine (TRIUMEQ) 600-50-300 MG tablet   valGANciclovir (VALCYTE) 450 MG tablet   Other Relevant Orders   Tdap vaccine greater than or equal to 7yo IM (Completed)   Flu Vaccine QUAD 36+ mos IM (Completed)   Ulcerative colitis ([place])    I reminded him that we need the help of his GI team for his care, going forward. He admitted had was out of line at recent appointment as he was quite impaired with marijuana. I asked him to please call for an appointment to help with management long-term; counseled that he needs to be compliant with ARVs to ensure he does not suffer from opportunistic process and to make him eligible for therapies in the future without increasing risk.       Depression with anxiety    I had him meet Marcie Bal our counselor today - he has a follow up appointment with her in 1 weeks for formal  intake. He will benefit greatly from some talk therapy. He does not want to take any anti-anxiety meds at this point.       Possible exposure to STD    Will treat empirically. Safe sex counseling provided. Condoms provided.  With urine symptoms and complaint of sometimes seeing what looks like blood will do U/A with micro and add culture in addition to gonorrhea and chlamydia NAAT.       Relevant Orders   Cytology (oral, anal, urethral) ancillary only   High blood pressure    Will resume his previous antihypertensives. He may benefit from atenolol addition given his elevated HR.  Likely worsened due to acute anxiety and abdominal pain.       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 It is possible the painful swallowing may also reflect CMV esophagitis (never been formally diagnosed with EGD).   Fortunately his CD4 was 701 in June and, as long as he avoids prednisone, is not at risk for this to be significantly decreased today even in the 3 months he has been off ARV.        Relevant Medications   abacavir-dolutegravir-lamiVUDine (TRIUMEQ) 600-50-300 MG tablet   valGANciclovir (VALCYTE) 450 MG tablet    Other Visit Diagnoses    Need for diphtheria-tetanus-pertussis (Tdap) vaccine       Relevant Orders   Tdap vaccine greater than or equal to 7yo IM (Completed)   Screening examination for venereal disease       Relevant Orders   Urine cytology ancillary only   Need for immunization against influenza       Relevant Orders   Flu Vaccine QUAD 36+ mos IM (Completed)     Return in about 4 weeks (around 01/04/2019).  Janene Madeira, MSN, NP-C [facility] [place] Pager: [phone]   12/18/2018  4:01 PM

## 2018-12-08 LAB — CYTOLOGY, (ORAL, ANAL, URETHRAL) ANCILLARY ONLY
Chlamydia: NEGATIVE
Neisseria Gonorrhea: NEGATIVE

## 2018-12-08 LAB — URINE CYTOLOGY ANCILLARY ONLY
Chlamydia: NEGATIVE
Neisseria Gonorrhea: NEGATIVE

## 2018-12-14 ENCOUNTER — Ambulatory Visit: Payer: Self-pay

## 2018-12-18 DIAGNOSIS — A0839 Other viral enteritis: Secondary | ICD-10-CM | POA: Insufficient documentation

## 2018-12-18 NOTE — Assessment & Plan Note (Signed)
Will treat empirically. Safe sex counseling provided. Condoms provided.  With urine symptoms and complaint of sometimes seeing what looks like blood will do U/A with micro and add culture in addition to gonorrhea and chlamydia NAAT.

## 2018-12-18 NOTE — Assessment & Plan Note (Signed)
I reminded him that we need the help of his GI team for his care, going forward. He admitted had was out of line at recent appointment as he was quite impaired with marijuana. I asked him to please call for an appointment to help with management long-term; counseled that he needs to be compliant with ARVs to ensure he does not suffer from opportunistic process and to make him eligible for therapies in the future without increasing risk.

## 2018-12-18 NOTE — Assessment & Plan Note (Signed)
Will have him re-induce with valcyte given his rectal bleeding again, presuming this may be ongoing for him since he is not taking his ARVs and he never took the suppressive medication after initial induction before. It is possible the painful swallowing may also reflect CMV esophagitis (never been formally diagnosed with EGD).   Fortunately his CD4 was 701 in June and, as long as he avoids prednisone, is not at risk for this to be significantly decreased today even in the 3 months he has been off ARV.

## 2018-12-18 NOTE — Assessment & Plan Note (Addendum)
Will resume his previous antihypertensives. He may benefit from atenolol addition given his elevated HR.  Likely worsened due to acute anxiety and abdominal pain.

## 2018-12-18 NOTE — Assessment & Plan Note (Signed)
I had him meet Marcie Bal our counselor today - he has a follow up appointment with her in 1 weeks for formal intake. He will benefit greatly from some talk therapy. He does not want to take any anti-anxiety meds at this point.

## 2018-12-18 NOTE — Assessment & Plan Note (Signed)
Reviewed his previous viral loads and explained that his poor adherence is causing his virus to get out of control again. He would like to go back to taking his Triumeq citing several reasons why but felt ultimately "it worked better." If he has a perceived benefit and can get motivated to take any treatment consistently we can make the change; will have him stop the [place] and start taking Triumeq every day with or without food. Counseled on avoiding co-administration of MVIs/supplements, Tums/Rolaids, etc.  He is having unprotected sex and concerned about STIs again with urethral discharge/painful urination etc. Counseled on safe sex especially in the setting of poor medication adherence.  Return in about 4 weeks (around 01/04/2019).

## 2019-01-04 ENCOUNTER — Ambulatory Visit: Payer: Self-pay | Admitting: Infectious Diseases

## 2019-01-05 ENCOUNTER — Ambulatory Visit: Payer: Self-pay | Admitting: Infectious Diseases

## 2019-04-02 ENCOUNTER — Ambulatory Visit: Payer: Self-pay | Admitting: Infectious Diseases

## 2019-04-03 ENCOUNTER — Telehealth (INDEPENDENT_AMBULATORY_CARE_PROVIDER_SITE_OTHER): Payer: Self-pay | Admitting: Infectious Diseases

## 2019-04-03 ENCOUNTER — Encounter: Payer: Self-pay | Admitting: Infectious Diseases

## 2019-04-03 ENCOUNTER — Other Ambulatory Visit: Payer: Self-pay

## 2019-04-03 DIAGNOSIS — K51811 Other ulcerative colitis with rectal bleeding: Secondary | ICD-10-CM

## 2019-04-03 DIAGNOSIS — B20 Human immunodeficiency virus [HIV] disease: Secondary | ICD-10-CM

## 2019-04-03 DIAGNOSIS — R4586 Emotional lability: Secondary | ICD-10-CM

## 2019-04-03 DIAGNOSIS — B259 Cytomegaloviral disease, unspecified: Secondary | ICD-10-CM

## 2019-04-03 DIAGNOSIS — A0839 Other viral enteritis: Secondary | ICD-10-CM

## 2019-04-03 MED ORDER — BIKTARVY 50-200-25 MG PO TABS: 1.0000 | ORAL_TABLET | Freq: Every day | ORAL | 5 refills | Status: DC

## 2019-04-03 NOTE — Assessment & Plan Note (Signed)
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 He completed recommended valcyte for treatment initially but then fell out of care. Back in September I planned to refill this at treatment dose pending labs however not sure he ever picked that up. Considering his last CD4 was well over 100 and CMV PCR negative recently he does not need further treatment or prophylaxis at this time.  Discussed that this is another reason not to use prednisone as it may lead to super-infection given risk of suppression to the immune system long term.

## 2019-04-03 NOTE — Assessment & Plan Note (Signed)
I think he would benefit from evaluation with psychiatry and gave him information to be seen at [facility] and / or at minimum re-schedule an appointment with Marcie Bal here for talk therapy. He may however have an underlying need for medication which may make this intervention more successful in the long run.

## 2019-04-03 NOTE — Assessment & Plan Note (Signed)
UMAP has expired September.  He was having nausea to Triumeq that seems to have prohibited his ability to take his medication. He was considering some of the newer options (Dovato) however I do not want him on 2-drug therapy until his adherence has marked improvements to allow this as an option to switch to in the future.  May be a good candidate for long acting injectable management once available.  Will have our team reach out to him re: re-application then plan to re-initiate his Biktarvy once a day once approved. Follow up in 8 weeks to repeat blood work and can do another video visit to review.

## 2019-04-03 NOTE — Progress Notes (Signed)
Patient Name: Larry Lutz  DOB: November 16, 1990  MRN: [id number]    Patient Active Problem List   Diagnosis Date Noted  . Mood swings 04/03/2019  . 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 Abdominal cramps 05/19/2018  . Hematochezia 05/03/2018  . Neuropathy of right hand 05/11/2017  . Elevated cholesterol 02/25/2017  . Possible exposure to STD 01/27/2017  . High blood pressure 01/27/2017  . Human immunodeficiency virus (HIV) disease ([place]) 01/05/2017  . Depression with anxiety   . Ulcerative colitis ([place]) 06/01/2016   SUBJECTIVE: Brief Narrative:  Larry Lutz is a 29 y.o. AA male with HIV infection. Originally diagnosed with HIV in 2017 in [place] (no records available). Never on medications in the past but had initially undetectable VL until recently without explanation. HIV Risk: MSM History of OIs: none  Previous Regimen:   Biktarvy >> suppressed   Genotype:   wild type 12/2016   CC: HIV follow up care. Still bleeding from rectum with diarrhea. Dysuria symptoms with hematuria x 2 months - requesting STD treatment. Not taking his Biktarvy routinely. Lots of anxiety.    HPI:  Tag is here for follow-up.  His last office visit was in September of this year and he was off his medication at that time and we switched him back to Triumeq per his request.  It sounds like he picked up a months worth and started taking it but had trouble with nausea and has since stopped.  He states that it has been about 3 months been since has been on any medications for his HIV. He is not taking the Valcyte at this time.  Last CD4 count was 700 and viral load was nearly 200 copies.  He has 1 male partner who is also HIV + and on treatment.  His partner feels like he may be bipolar.  Karmello agrees that that might be a possibility.  He feels that his moods swing rapidly from feeling okay to angry and isolated during the week.  He thinks that this is mostly related to the fact that he "worries about everybody's emotions  and tries to fix everybody."   He is currently working outside of the home.   He has continued to have stomach pain and diarrhea. He was on lortab for chronic abdominal pain which was helpful in the past. Mesalamine helps with the inflammation as well. He never picked up the Bentyl I tried to get him with coupon assistance from last March when he was having more severe symptoms and distension. He has been taking Excedrine with mild relief.  Reports "a lot" of bowel movements still that are described to have tan mucus mixed with occasional blood and brown diarrhea stool. He has not lost any weight. He states he has called the GI clinic and mentioned consideration of using Humira but hasn't heard back.   Last concern is ongoing need to clear his nose and throat.  He reports some greenish-yellow mucus that he can suck from the nose into the mouth and pressure.  He has no fevers or increasing pain or facial tenderness/swelling.  He has the past attempted allergy pill and steroid nasal spray but not sure how consistent the use was.  He wonders if he needs an antibiotic to fix his current problem that has been going on for over a year.   Review of Systems  Constitutional: Negative for appetite change, chills, fatigue, fever and unexpected weight change.  HENT: Positive for congestion, postnasal drip and sore throat (  at times, especially in the morning). Negative for voice change.   Eyes: Negative for visual disturbance.  Respiratory: Negative for cough and shortness of breath.   Cardiovascular: Negative for chest pain.  Gastrointestinal: Positive for abdominal distention, abdominal pain, blood in stool, diarrhea and rectal pain. Negative for nausea and vomiting.  Genitourinary: Negative for dysuria.  Musculoskeletal: Negative for joint swelling.  Skin: Negative for color change and rash.  Neurological: Negative for dizziness and headaches.  Hematological: Negative for adenopathy.    Psychiatric/Behavioral: Positive for agitation and dysphoric mood. Negative for sleep disturbance. The patient is not nervous/anxious.      Past Medical History:  Diagnosis Date  . Anxiety   . Asthma   . Colitis   . Colon polyp   . Crohn disease ([place])   . Depressed   . GI bleed   . HIV (human immunodeficiency virus infection) ([place])   . Hypertension   . IBS (irritable bowel syndrome)   . UC (ulcerative colitis) ([facility])    Allergies  Allergen Reactions  . Morphine And Related     Caused him to itch     Outpatient Medications Prior to Visit  Medication Sig Dispense Refill  . dicyclomine (BENTYL) 20 MG tablet Take 1 tablet (20 mg total) by mouth 2 (two) times daily. 60 tablet 0  . fluconazole (DIFLUCAN) 100 MG tablet Take 1 tablet (100 mg total) by mouth daily. 14 tablet 1  . HYDROcodone-acetaminophen (NORCO/VICODIN) 5-325 MG tablet Take 1-2 tablets by mouth 2 (two) times daily as needed for moderate pain. 30 tablet 0  . lisinopril (PRINIVIL) 10 MG tablet Take 1 tablet (10 mg total) by mouth daily. 30 tablet 1  . Mesalamine (ASACOL) 400 MG CPDR DR capsule Take 2 capsules (800 mg total) by mouth 3 (three) times daily. 180 capsule 6  . mesalamine (LIALDA) 1.2 g EC tablet Take 4 tablets (4.8 g total) by mouth daily with breakfast for 30 days. (Patient not taking: Reported on 05/16/2018) 120 tablet 0  . predniSONE (DELTASONE) 10 MG tablet Take 3 tablets (30 mg total) by mouth daily with breakfast. And decrease as directed 100 tablet 0  . predniSONE (DELTASONE) 10 MG tablet Take 2 tablets by mouth daily for 6 weeks, take 1 tablet daily for 7 days, take 1/2 tab daily for 7 days then stop. 100 tablet 0  . valGANciclovir (VALCYTE) 450 MG tablet Take 2 tablets (900 mg total) by mouth daily. 60 tablet 2  . abacavir-dolutegravir-lamiVUDine (TRIUMEQ) 600-50-300 MG tablet Take 1 tablet by mouth daily. 30 tablet 5  . bictegravir-emtricitabine-tenofovir AF (BIKTARVY) 50-200-25 MG TABS tablet Take 1  tablet by mouth daily. (Patient not taking: Reported on 12/07/2018) 30 tablet 5   No facility-administered medications prior to visit.    Social History   Tobacco Use  . Smoking status: Current Every Day Smoker  . Smokeless tobacco: Never Used  Substance Use Topics  . Alcohol use: No  . Drug use: Yes    Types: Marijuana    Physical Exam and Objective Findings:  There were no vitals filed for this visit. There is no height or weight on file to calculate BMI.  Physical Exam Constitutional:      Appearance: Normal appearance. He is not ill-appearing.  HENT:     Head: Normocephalic.     Mouth/Throat:     Mouth: Mucous membranes are moist.     Pharynx: Oropharynx is clear.  Eyes:     General: No scleral icterus. Pulmonary:  Effort: Pulmonary effort is normal.  Musculoskeletal:        General: Normal range of motion.     Cervical back: Normal range of motion.  Skin:    Coloration: Skin is not jaundiced or pale.  Neurological:     Mental Status: He is alert and oriented to person, place, and time.  Psychiatric:        Mood and Affect: Mood normal.        Judgment: Judgment normal.     Lab Results Lab Results  Component Value Date   WBC 9.4 08/15/2018   HGB 10.7 (L) 08/15/2018   HCT 38.6 08/15/2018   MCV 60.7 (L) 08/15/2018   PLT 551 (H) 08/15/2018    Lab Results  Component Value Date   CREATININE 1.15 08/15/2018   BUN 15 08/15/2018   NA 141 08/15/2018   K 4.5 08/15/2018   CL 103 08/15/2018   CO2 29 08/15/2018    Lab Results  Component Value Date   ALT 12 08/15/2018   AST 20 08/15/2018   ALKPHOS 71 05/04/2018   BILITOT 0.6 08/15/2018    Lab Results  Component Value Date   CHOL 236 (H) 01/05/2017   HDL 45 01/05/2017   LDLCALC 163 (H) 01/05/2017   TRIG 138 01/05/2017   CHOLHDL 5.2 (H) 01/05/2017   HIV 1 RNA Quant (copies/mL)  Date Value  08/15/2018 176 (H)  05/17/2018 83 (H)  02/22/2018 25 (H)   CD4 T Cell Abs (/uL)  Date Value  08/15/2018  701  05/17/2018 470  02/22/2018 690   ASSESSMENT & PLAN:    Problem List Items Addressed This Visit      Unprioritized   Ulcerative colitis ([place])    I told Tremain that I cannot give him constant doses of prednisone when there is other medications available to treat his condition. Prednisone offers a reduction in his immune system further and affect that he has been off his HIV medications for a while already makes me nervous given the current pandemic climate. We will reach out to Dr. Loletha Carrow who saw him last in May 2020. I again provided Romeo the contact information to his call and schedule an appointment with his team for follow-up care. I offered a refill of his Bentyl for pain medication option -he will consider. I told him opioid use is only likely to contribute to worsening abdominal problems especially in the long run and would prefer him to be on appropriate therapy to treat the underlying condition and not just the symptoms.      Mood swings    I think he would benefit from evaluation with psychiatry and gave him information to be seen at [facility] and / or at minimum re-schedule an appointment with Marcie Bal here for talk therapy. He may however have an underlying need for medication which may make this intervention more successful in the long run.       Human immunodeficiency virus (HIV) disease ([place]) (Chronic)    UMAP has expired September.  He was having nausea to Triumeq that seems to have prohibited his ability to take his medication. He was considering some of the newer options (Dovato) however I do not want him on 2-drug therapy until his adherence has marked improvements to allow this as an option to switch to in the future.  May be a good candidate for long acting injectable management once available.  Will have our team reach out to him re: re-application then plan to  re-initiate his Biktarvy once a day once approved. Follow up in 8 weeks to repeat blood work and can do another video  visit to review.       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 He completed recommended valcyte for treatment initially but then fell out of care. Back in September I planned to refill this at treatment dose pending labs however not sure he ever picked that up. Considering his last CD4 was well over 100 and CMV PCR negative recently he does not need further treatment or prophylaxis at this time.  Discussed that this is another reason not to use prednisone as it may lead to super-infection given risk of suppression to the immune system long term.       Relevant Medications   bictegravir-emtricitabine-tenofovir AF (BIKTARVY) 50-200-25 MG TABS tablet      Janene Madeira, MSN, NP-C [facility] [place] Pager: [phone]  04/03/2019  12:48 PM

## 2019-04-03 NOTE — Patient Instructions (Signed)
   Please call Joycelyn Schmid @ [phone] to schedule a dental appointment

## 2019-04-03 NOTE — Assessment & Plan Note (Signed)
I told Larry Lutz that I cannot give him constant doses of prednisone when there is other medications available to treat his condition. Prednisone offers a reduction in his immune system further and affect that he has been off his HIV medications for a while already makes me nervous given the current pandemic climate. We will reach out to Dr. Loletha Carrow who saw him last in May 2020. I again provided Carlson the contact information to his call and schedule an appointment with his team for follow-up care. I offered a refill of his Bentyl for pain medication option -he will consider. I told him opioid use is only likely to contribute to worsening abdominal problems especially in the long run and would prefer him to be on appropriate therapy to treat the underlying condition and not just the symptoms.

## 2019-04-04 ENCOUNTER — Encounter: Payer: Self-pay | Admitting: Infectious Diseases

## 2019-04-19 MED ORDER — OMEPRAZOLE 40 MG PO CPDR: 40.0000 mg | DELAYED_RELEASE_CAPSULE | Freq: Every day | ORAL | 1 refills | Status: DC

## 2019-04-25 ENCOUNTER — Ambulatory Visit (HOSPITAL_COMMUNITY)
Admission: EM | Admit: 2019-04-25 | Discharge: 2019-04-25 | Disposition: A | Discharge status: Home / Self Care | Payer: Self-pay | Attending: Family Medicine | Admitting: Family Medicine

## 2019-04-25 ENCOUNTER — Ambulatory Visit (INDEPENDENT_AMBULATORY_CARE_PROVIDER_SITE_OTHER): Payer: Self-pay

## 2019-04-25 ENCOUNTER — Encounter (HOSPITAL_COMMUNITY): Payer: Self-pay | Admitting: Emergency Medicine

## 2019-04-25 ENCOUNTER — Other Ambulatory Visit: Payer: Self-pay

## 2019-04-25 DIAGNOSIS — S6992XA Unspecified injury of left wrist, hand and finger(s), initial encounter: Secondary | ICD-10-CM

## 2019-04-25 MED ORDER — NAPROXEN 375 MG PO TABS: 375.0000 mg | ORAL_TABLET | Freq: Two times a day (BID) | ORAL | 0 refills | Status: DC

## 2019-04-25 NOTE — ED Notes (Signed)
Ortho tech aware

## 2019-04-25 NOTE — ED Triage Notes (Signed)
PT punched something and had instant pain in left hand. Punched something again today and pain worsened.

## 2019-04-25 NOTE — ED Provider Notes (Signed)
[facility]   [id number] 04/25/19 Arrival Time: 2122  ASSESSMENT & PLAN:  1. Injury of left hand, initial encounter     I have personally viewed the imaging studies ordered this visit. No fracture.  Question of fifth finger extensor tendon disruption given his inability to extend finger.  Orthopaedic tech to apply ulnar gutter splint. Recommend: Follow-up Information    Schedule an appointment as soon as possible for a visit  with Cindra Presume, MD.   Specialty: Plastic Surgery Why: To evaluate your hand injury. Contact information: [address] [place] [address] [phone]           If needed: Meds ordered this encounter  Medications  . naproxen (NAPROSYN) 375 MG tablet    Sig: Take 1 tablet (375 mg total) by mouth 2 (two) times daily with a meal.    Dispense:  10 tablet    Refill:  0   Work note provided.  Orders Placed This Encounter  Procedures  . DG Hand Complete Left  . Apply splint short arm  . Apply sling arm foam    Reviewed expectations re: course of current medical issues. Questions answered. Outlined signs and symptoms indicating need for more acute intervention. Patient verbalized understanding. After Visit Summary given.  SUBJECTIVE: History from: patient. Larry Lutz is a 29 y.o. male who reports fairly persistent moderate pain of his left hand; described as aching; without radiation. Onset: abrupt. First noted: yesterday. Injury/trama: reports punching object yesterday with immediate pain; punched again today; more pain. Reports inability to extend left fifth finger. No extremity sensation changes or weakness. Symptoms have progressed to a point and plateaued since beginning. Aggravating factors: certain movements. Alleviating factors: have not been identified. Associated symptoms: none reported. Self treatment: has not tried OTC therapies.  History of similar: no.  Past Surgical History:  Procedure  Laterality Date  . BRAIN SURGERY    . COLONOSCOPY     in [place] around 2017 or 2018       OBJECTIVE:  Vitals:   04/25/19 1424  BP: (!) 146/92  Pulse: 94  Resp: 16  Temp: 98.1 F (36.7 C)  TempSrc: Oral  SpO2: 100%    General appearance: alert; no distress HEENT: [place]; AT Neck: supple with FROM Resp: unlabored respirations Extremities: . LUE: warm with well perfused appearance; fairly well localized marked tenderness over left fifth metacarpal extending into left fifth finger; without gross deformities; swelling: minimal; bruising: none; unable to extend fifth finger; pain with attempted flexion CV: brisk extremity capillary refill of LUE; 2+ radial pulse of LUE. Skin: warm and dry; no visible rashes Neurologic: gait normal; normal sensation and strength of LUE Psychological: alert and cooperative; normal mood and affect  Imaging: DG Hand Complete Left  Result Date: 04/25/2019 CLINICAL DATA:  Altercation earlier today. Left hand pain and swelling. EXAM: LEFT HAND - COMPLETE 3+ VIEW COMPARISON:  None. FINDINGS: There is no evidence of fracture or dislocation. There is no evidence of arthropathy or other focal bone abnormality. Soft tissues are unremarkable. IMPRESSION: Negative. Electronically Signed   By: Lajean Manes M.D.   On: 04/25/2019 14:36     Allergies  Allergen Reactions  . Morphine And Related     Caused him to itch    Past Medical History:  Diagnosis Date  . Anxiety   . Asthma   . Colitis   . Colon polyp   . Crohn disease ([place])   . Depressed   .  GI bleed   . HIV (human immunodeficiency virus infection) ([place])   . Hypertension   . IBS (irritable bowel syndrome)   . UC (ulcerative colitis) ([place])    Social History   Socioeconomic History  . Marital status: Single    Spouse name: Not on file  . Number of children: 0  . Years of education: Not on file  . Highest education level: Not on file  Occupational History  . Not on file  Tobacco Use  .  Smoking status: Current Every Day Smoker  . Smokeless tobacco: Never Used  Substance and Sexual Activity  . Alcohol use: No  . Drug use: Yes    Types: Marijuana  . Sexual activity: Yes    Partners: Male    Comment: declined comdoms  Other Topics Concern  . Not on file  Social History Narrative  . Not on file   Social Determinants of Health   Financial Resource Strain:   . Difficulty of Paying Living Expenses: Not on file  Food Insecurity:   . Worried About Charity fundraiser in the Last Year: Not on file  . Ran Out of Food in the Last Year: Not on file  Transportation Needs:   . Lack of Transportation (Medical): Not on file  . Lack of Transportation (Non-Medical): Not on file  Physical Activity:   . Days of Exercise per Week: Not on file  . Minutes of Exercise per Session: Not on file  Stress:   . Feeling of Stress : Not on file  Social Connections:   . Frequency of Communication with Friends and Family: Not on file  . Frequency of Social Gatherings with Friends and Family: Not on file  . Attends Religious Services: Not on file  . Active Member of Clubs or Organizations: Not on file  . Attends Archivist Meetings: Not on file  . Marital Status: Not on file   Family History  Problem Relation Age of Onset  . Ulcerative colitis Mother   . Irritable bowel syndrome Mother   . Prostate cancer Paternal Grandfather   . Diabetes Paternal Aunt   . Colon cancer Neg Hx   . Esophageal cancer Neg Hx   . Rectal cancer Neg Hx   . Stomach cancer Neg Hx    Past Surgical History:  Procedure Laterality Date  . BRAIN SURGERY    . COLONOSCOPY     in [place] around 2017 or 2018       Vanessa Kick, MD 04/25/19 1454

## 2019-04-25 NOTE — Progress Notes (Signed)
Orthopedic Tech Progress Note Patient Details:  Larry Lutz 1990/10/17 [id number]  Ortho Devices Type of Ortho Device: Ace wrap, Arm sling, Ulna gutter splint Ortho Device/Splint Location: left Ortho Device/Splint Interventions: Application   Post Interventions Patient Tolerated: Well Instructions Provided: Care of device   [place] Pink 04/25/2019, 3:23 PM

## 2019-05-02 ENCOUNTER — Institutional Professional Consult (permissible substitution): Payer: Self-pay | Admitting: Plastic Surgery

## 2019-09-10 ENCOUNTER — Telehealth: Payer: Self-pay

## 2019-09-10 NOTE — Telephone Encounter (Signed)
Patient would like for provider to call him regarding his health. Patient would not relay any other details regarding why he needed a phone call. Advise patient that he could also communicate via MyChart, provided patient with his MyChart username.  Eugenia Mcalpine ]

## 2019-09-12 ENCOUNTER — Other Ambulatory Visit: Payer: Self-pay

## 2019-09-12 ENCOUNTER — Ambulatory Visit (INDEPENDENT_AMBULATORY_CARE_PROVIDER_SITE_OTHER): Payer: Self-pay | Admitting: Infectious Diseases

## 2019-09-12 DIAGNOSIS — A0839 Other viral enteritis: Secondary | ICD-10-CM

## 2019-09-12 DIAGNOSIS — B259 Cytomegaloviral disease, unspecified: Secondary | ICD-10-CM

## 2019-09-12 DIAGNOSIS — F418 Other specified anxiety disorders: Secondary | ICD-10-CM

## 2019-09-12 DIAGNOSIS — B20 Human immunodeficiency virus [HIV] disease: Secondary | ICD-10-CM

## 2019-09-12 MED ORDER — BIKTARVY 50-200-25 MG PO TABS: 1.0000 | ORAL_TABLET | Freq: Every day | ORAL | 5 refills | Status: DC

## 2019-09-12 NOTE — Patient Instructions (Addendum)
Will work on getting you back on [employer].   Please come back to see me in a month and our financial team to re apply for ADAP for you.   For [facility] to get in touch with Caryl Pina - [phone]  Please consider going to [facility] for assessment:  [place], [place] [address] HelpLine: [phone] or [phone]  Will need to also reset

## 2019-09-18 ENCOUNTER — Encounter: Payer: Self-pay | Admitting: Infectious Diseases

## 2019-09-18 NOTE — Assessment & Plan Note (Signed)
Acutely exacerbated with situational stressors. We spent a majority of the time trying to come up with a plan for a few things he can work on at a time to avoid becoming overwhelmed.  He met with Caryl Pina today to re-engaged with [facility] - he has had trouble returning calls to them in the past and was previously closed. He will need to keep regular contact with them.  Suicidal without intent or plan - he has bipolar features to me. Would like to get him referral to [place] at [facility] outpatient. I gave him information for walk in assessment so he can go urgently if needed. I suggested he truly consider this now.
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
## 2019-09-18 NOTE — Assessment & Plan Note (Signed)
Unclear if he ever resumed treatment after our visit in January. His HMAP is still approved - will send in refill for [place] today and have him meet with our financial team for next interval application. No symptoms suggestive of opportunistic process today. Last CD4 in June 2020 was 700. Will repeat at next visit after he gets re-established on ARVs. He will return to clinic in 4 weeks to follow up.

## 2019-09-18 NOTE — Assessment & Plan Note (Addendum)
Superinfection in the setting of previous acute flare of chronic UC - Off ARVs and Valcyte. No return of symptoms at this time. Not on any medications to maintain.

## 2019-10-11 ENCOUNTER — Ambulatory Visit: Payer: Self-pay | Admitting: Infectious Diseases

## 2020-02-06 ENCOUNTER — Other Ambulatory Visit: Payer: Self-pay

## 2020-02-06 ENCOUNTER — Ambulatory Visit: Payer: Self-pay | Admitting: Infectious Diseases

## 2020-02-06 ENCOUNTER — Ambulatory Visit: Payer: Self-pay

## 2020-02-11 ENCOUNTER — Encounter: Payer: Self-pay | Admitting: Infectious Diseases

## 2020-02-12 ENCOUNTER — Other Ambulatory Visit: Payer: Self-pay

## 2020-02-12 ENCOUNTER — Ambulatory Visit: Payer: Self-pay | Admitting: Pharmacist

## 2020-02-27 ENCOUNTER — Ambulatory Visit: Payer: Self-pay | Admitting: Infectious Diseases

## 2020-04-01 ENCOUNTER — Other Ambulatory Visit: Payer: Self-pay

## 2020-04-01 ENCOUNTER — Emergency Department (HOSPITAL_BASED_OUTPATIENT_CLINIC_OR_DEPARTMENT_OTHER)
Admission: EM | Admit: 2020-04-01 | Discharge: 2020-04-01 | Disposition: A | Discharge status: Home / Self Care | Payer: Self-pay

## 2020-05-19 ENCOUNTER — Ambulatory Visit: Payer: Self-pay | Admitting: Infectious Diseases

## 2020-06-27 ENCOUNTER — Emergency Department (HOSPITAL_COMMUNITY)
Admission: EM | Admit: 2020-06-27 | Discharge: 2020-06-27 | Disposition: A | Discharge status: Home / Self Care | Payer: Self-pay | Attending: Emergency Medicine | Admitting: Emergency Medicine

## 2020-06-27 ENCOUNTER — Encounter (HOSPITAL_COMMUNITY): Payer: Self-pay

## 2020-06-27 ENCOUNTER — Other Ambulatory Visit: Payer: Self-pay

## 2020-06-27 DIAGNOSIS — K611 Rectal abscess: Secondary | ICD-10-CM

## 2020-06-27 DIAGNOSIS — Z79899 Other long term (current) drug therapy: Secondary | ICD-10-CM | POA: Insufficient documentation

## 2020-06-27 DIAGNOSIS — J45909 Unspecified asthma, uncomplicated: Secondary | ICD-10-CM | POA: Insufficient documentation

## 2020-06-27 DIAGNOSIS — F17200 Nicotine dependence, unspecified, uncomplicated: Secondary | ICD-10-CM | POA: Insufficient documentation

## 2020-06-27 DIAGNOSIS — Z21 Asymptomatic human immunodeficiency virus [HIV] infection status: Secondary | ICD-10-CM | POA: Insufficient documentation

## 2020-06-27 DIAGNOSIS — R21 Rash and other nonspecific skin eruption: Secondary | ICD-10-CM | POA: Insufficient documentation

## 2020-06-27 DIAGNOSIS — I10 Essential (primary) hypertension: Secondary | ICD-10-CM | POA: Insufficient documentation

## 2020-06-27 DIAGNOSIS — L02215 Cutaneous abscess of perineum: Secondary | ICD-10-CM | POA: Insufficient documentation

## 2020-06-27 LAB — CBC WITH DIFFERENTIAL/PLATELET
Abs Immature Granulocytes: 0.01 10*3/uL (ref 0.00–0.07)
Basophils Absolute: 0 10*3/uL (ref 0.0–0.1)
Basophils Relative: 0 %
Eosinophils Absolute: 0.1 10*3/uL (ref 0.0–0.5)
Eosinophils Relative: 1 %
HCT: 36.1 % — ABNORMAL LOW (ref 39.0–52.0)
Hemoglobin: 10.4 g/dL — ABNORMAL LOW (ref 13.0–17.0)
Immature Granulocytes: 0 %
Lymphocytes Relative: 28 %
Lymphs Abs: 2.2 10*3/uL (ref 0.7–4.0)
MCH: 17.8 pg — ABNORMAL LOW (ref 26.0–34.0)
MCHC: 28.8 g/dL — ABNORMAL LOW (ref 30.0–36.0)
MCV: 61.9 fL — ABNORMAL LOW (ref 80.0–100.0)
Monocytes Absolute: 1 10*3/uL (ref 0.1–1.0)
Monocytes Relative: 12 %
Neutro Abs: 4.7 10*3/uL (ref 1.7–7.7)
Neutrophils Relative %: 59 %
Platelets: 443 10*3/uL — ABNORMAL HIGH (ref 150–400)
RBC: 5.83 MIL/uL — ABNORMAL HIGH (ref 4.22–5.81)
RDW: 22 % — ABNORMAL HIGH (ref 11.5–15.5)
WBC: 8 10*3/uL (ref 4.0–10.5)
nRBC: 0 % (ref 0.0–0.2)

## 2020-06-27 LAB — BASIC METABOLIC PANEL
Anion gap: 8 (ref 5–15)
BUN: 21 mg/dL — ABNORMAL HIGH (ref 6–20)
CO2: 26 mmol/L (ref 22–32)
Calcium: 9.5 mg/dL (ref 8.9–10.3)
Chloride: 107 mmol/L (ref 98–111)
Creatinine, Ser: 0.9 mg/dL (ref 0.61–1.24)
GFR, Estimated: >60 mL/min (ref >60)
Glucose, Bld: 83 mg/dL (ref 70–99)
Potassium: 3.8 mmol/L (ref 3.5–5.1)
Sodium: 141 mmol/L (ref 135–145)

## 2020-06-27 MED ORDER — VALACYCLOVIR HCL 1 G PO TABS: 1000.0000 mg | ORAL_TABLET | Freq: Three times a day (TID) | ORAL | 0 refills | Status: DC

## 2020-06-27 MED ORDER — AMOXICILLIN-POT CLAVULANATE 875-125 MG PO TABS: 1.0000 | ORAL_TABLET | Freq: Two times a day (BID) | ORAL | 0 refills | Status: DC

## 2020-06-27 NOTE — Discharge Instructions (Signed)
Take Augmentin as prescribed and complete the full course for rectal drainage.  Follow-up with GI, referral given. Take Valtrex as prescribed for rash on your right leg, as discussed could be shingles versus HSV.  Recommend recheck with your infectious disease doctor for this.

## 2020-06-27 NOTE — ED Provider Notes (Signed)
[facility] DEPT Provider Note   CSN: [id number] Arrival date & time: 06/27/20  2119     History Chief Complaint  Patient presents with  . Abscess    Abscess to rectum x3 months    Larry Lutz is a 30 y.o. male.  30 year old male with past medical history of ulcerative colitis, Crohn disease, HIV (reports compliance with therapy).  Patient states that he felt like he had a bug bite or abscess started up near his rectum about 3 months ago.  Patient was applying a heating pad to the area without any improvement.  Patient was recently incarcerated and about a month ago states the area opened and drained blood and purulent material.  Patient continued to have purulent drainage but did not have any care while in jail for this.  Patient was just released and comes to the ER today seeking care.  Denies any changes in symptoms, fevers, abdominal pain.  Has not seen GI recently and does not take any medications for his Crohn's or UC.  Also reports a sensitive/pruritic rash to his right lateral thigh and area near his knee that has been present for about a month.        Past Medical History:  Diagnosis Date  . Anxiety   . Asthma   . Colitis   . Colon polyp   . Crohn disease ([place])   . Depressed   . GI bleed   . HIV (human immunodeficiency virus infection) ([place])   . Hypertension   . IBS (irritable bowel syndrome)   . UC (ulcerative colitis) [facility])     Patient Active Problem List   Diagnosis Date Noted  . Mood swings 04/03/2019  . 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 Abdominal cramps 05/19/2018  . Neuropathy of right hand 05/11/2017  . Elevated cholesterol 02/25/2017  . Possible exposure to STD 01/27/2017  . High blood pressure 01/27/2017  . Human immunodeficiency virus (HIV) disease ([place]) 01/05/2017  . Depression with anxiety   . Ulcerative colitis ([place]) 06/01/2016    Past Surgical History:  Procedure Laterality Date  . BRAIN SURGERY    .  COLONOSCOPY     in [place] around 2017 or 2018        Family History  Problem Relation Age of Onset  . Ulcerative colitis Mother   . Irritable bowel syndrome Mother   . Prostate cancer Paternal Grandfather   . Diabetes Paternal Aunt   . Colon cancer Neg Hx   . Esophageal cancer Neg Hx   . Rectal cancer Neg Hx   . Stomach cancer Neg Hx     Social History   Tobacco Use  . Smoking status: Current Every Day Smoker  . Smokeless tobacco: Never Used  Vaping Use  . Vaping Use: Never used  Substance Use Topics  . Alcohol use: No  . Drug use: Yes    Types: Marijuana    Home Medications Prior to Admission medications   Medication Sig Start Date End Date Taking? Authorizing Provider  amoxicillin-clavulanate (AUGMENTIN) 875-125 MG tablet Take 1 tablet by mouth every 12 (twelve) hours. 06/27/20  Yes Tacy Learn, PA-C  valACYclovir (VALTREX) 1000 MG tablet Take 1 tablet (1,000 mg total) by mouth 3 (three) times daily. 06/27/20  Yes Tacy Learn, PA-C  bictegravir-emtricitabine-tenofovir AF (BIKTARVY) 50-200-25 MG TABS tablet Take 1 tablet by mouth daily. 09/12/19   [place] Callas, NP  lisinopril (PRINIVIL) 10 MG tablet Take 1 tablet (10 mg  total) by mouth daily. Patient not taking: Reported on 04/03/2019 12/07/18   [place] Callas, NP  Mesalamine (ASACOL) 400 MG CPDR DR capsule Take 2 capsules (800 mg total) by mouth 3 (three) times daily. Patient not taking: Reported on 04/03/2019 08/02/18   Doran Stabler, MD  mesalamine (LIALDA) 1.2 g EC tablet Take 4 tablets (4.8 g total) by mouth daily with breakfast for 30 days. Patient not taking: Reported on 05/16/2018 05/05/18 06/04/18  Dana Allan I, MD  naproxen (NAPROSYN) 375 MG tablet Take 1 tablet (375 mg total) by mouth 2 (two) times daily with a meal. 04/25/19   Vanessa Kick, MD  omeprazole (PRILOSEC) 40 MG capsule Take 1 capsule (40 mg total) by mouth daily. 04/19/19   [place] Callas, NP    Allergies    Morphine and  related  Review of Systems   Review of Systems  Constitutional: Negative for fever.  Gastrointestinal: Negative for abdominal pain, blood in stool, constipation, diarrhea, nausea and vomiting.  Genitourinary: Negative for difficulty urinating and dysuria.  Musculoskeletal: Negative for arthralgias, joint swelling and myalgias.  Skin: Positive for rash and wound.  Allergic/Immunologic: Positive for immunocompromised state.  Neurological: Negative for weakness.  All other systems reviewed and are negative.   Physical Exam Updated Vital Signs BP (!) 137/93   Pulse 96   Temp 98.5 F (36.9 C) (Oral)   Resp 17   Ht 5' 8"  (1.727 m)   Wt 68 kg   SpO2 99%   BMI 22.81 kg/m   Physical Exam Vitals and nursing note reviewed.  Constitutional:      General: He is not in acute distress.    Appearance: He is well-developed. He is not diaphoretic.  HENT:     Head: Normocephalic and atraumatic.  Pulmonary:     Effort: Pulmonary effort is normal.  Abdominal:     Palpations: Abdomen is soft.     Tenderness: There is no abdominal tenderness.  Genitourinary:   Musculoskeletal:        General: No swelling, tenderness or deformity.  Skin:    General: Skin is warm and dry.     Findings: Rash present. No erythema.       Neurological:     Mental Status: He is alert and oriented to person, place, and time.  Psychiatric:        Behavior: Behavior normal.     ED Results / Procedures / Treatments   Labs (all labs ordered are listed, but only abnormal results are displayed) Labs Reviewed  BASIC METABOLIC PANEL - Abnormal; Notable for the following components:      Result Value   BUN 21 (*)    All other components within normal limits  CBC WITH DIFFERENTIAL/PLATELET - Abnormal; Notable for the following components:   RBC 5.83 (*)    Hemoglobin 10.4 (*)    HCT 36.1 (*)    MCV 61.9 (*)    MCH 17.8 (*)    MCHC 28.8 (*)    RDW 22.0 (*)    Platelets 443 (*)    All other components  within normal limits    EKG None  Radiology No results found.  Procedures Procedures   Medications Ordered in ED Medications - No data to display  ED Course  I have reviewed the triage vital signs and the nursing notes.  Pertinent labs & imaging results that were available during my care of the patient were reviewed by me and considered in my  medical decision making (see chart for details).  Clinical Course as of 06/27/20 1110  Fri Jun 27, 9132  5636 30 year old male with history as above with concern for ongoing rectal drainage.  On exam, has a small opening on the left side at approximately 9:00 with active white drainage, no surrounding erythema or fluctuance, no tenderness. Suspect this may be a fistula.  As patient does not have fevers, abdominal pain or significant pain, will start on antibiotics and refer to his GI for follow-up.  Labs with CBC with normal white blood cell count, BMP unremarkable.  Also concern for right thigh rash.  Discussed with patient could be shingles versus HSV.  Will give prescription for Valtrex and advised to follow-up with his ID provider. [LM]    Clinical Course User Index [LM] Roque Lias   MDM Rules/Calculators/A&P                          Final Clinical Impression(s) / ED Diagnoses Final diagnoses:  Perirectal abscess  Rash    Rx / DC Orders ED Discharge Orders         Ordered    amoxicillin-clavulanate (AUGMENTIN) 875-125 MG tablet  Every 12 hours        06/27/20 1107    valACYclovir (VALTREX) 1000 MG tablet  3 times daily        06/27/20 1107           Tacy Learn, PA-C 06/27/20 1110    Fredia Sorrow, MD 06/27/20 1722

## 2020-06-27 NOTE — ED Triage Notes (Signed)
Pt presents to the ED for an abscess to his rectum which he states began 3 months ago. He states the abscess drained one month ago and he has not been treated for this. He states he was incarcerated and was released yesterday and has not had any treatment for his abscess, HIV, or ulcerative colitis. He denies any fever, chills, n/v/d, and denies any past hx of abscess in the past.

## 2020-06-30 ENCOUNTER — Other Ambulatory Visit: Payer: Self-pay

## 2020-06-30 ENCOUNTER — Ambulatory Visit: Payer: Self-pay

## 2020-07-04 ENCOUNTER — Encounter: Payer: Self-pay | Admitting: Infectious Diseases

## 2020-07-10 ENCOUNTER — Ambulatory Visit: Payer: Self-pay

## 2020-07-10 ENCOUNTER — Other Ambulatory Visit: Payer: Self-pay

## 2020-07-10 DIAGNOSIS — Z79899 Other long term (current) drug therapy: Secondary | ICD-10-CM

## 2020-07-10 DIAGNOSIS — B20 Human immunodeficiency virus [HIV] disease: Secondary | ICD-10-CM

## 2020-07-10 DIAGNOSIS — Z113 Encounter for screening for infections with a predominantly sexual mode of transmission: Secondary | ICD-10-CM

## 2020-07-17 ENCOUNTER — Ambulatory Visit: Payer: Self-pay | Admitting: Infectious Diseases

## 2020-08-08 ENCOUNTER — Other Ambulatory Visit: Payer: Self-pay

## 2020-08-08 ENCOUNTER — Ambulatory Visit (INDEPENDENT_AMBULATORY_CARE_PROVIDER_SITE_OTHER): Payer: Self-pay | Admitting: Internal Medicine

## 2020-08-08 ENCOUNTER — Encounter: Payer: Self-pay | Admitting: Internal Medicine

## 2020-08-08 ENCOUNTER — Ambulatory Visit (INDEPENDENT_AMBULATORY_CARE_PROVIDER_SITE_OTHER): Payer: Self-pay | Admitting: Pharmacist

## 2020-08-08 VITALS — BP 160/94 | HR 90 | Temp 98.1°F | Wt 151.0 lb

## 2020-08-08 DIAGNOSIS — B20 Human immunodeficiency virus [HIV] disease: Secondary | ICD-10-CM

## 2020-08-08 DIAGNOSIS — K51811 Other ulcerative colitis with rectal bleeding: Secondary | ICD-10-CM

## 2020-08-08 DIAGNOSIS — L738 Other specified follicular disorders: Secondary | ICD-10-CM | POA: Insufficient documentation

## 2020-08-08 DIAGNOSIS — Z202 Contact with and (suspected) exposure to infections with a predominantly sexual mode of transmission: Secondary | ICD-10-CM

## 2020-08-08 MED ORDER — PENICILLIN G BENZATHINE 1200000 UNIT/2ML IM SUSY
1.2000 10*6.[IU] | PREFILLED_SYRINGE | Freq: Once | INTRAMUSCULAR | Status: AC
  Administered 2020-08-08: 1.2 10*6.[IU] via INTRAMUSCULAR

## 2020-08-08 MED ORDER — AMOXICILLIN-POT CLAVULANATE 875-125 MG PO TABS: 1.0000 | ORAL_TABLET | Freq: Two times a day (BID) | ORAL | 0 refills | Status: DC

## 2020-08-08 MED ORDER — MUPIROCIN 2 % EX OINT: 1.0000 "application " | TOPICAL_OINTMENT | Freq: Three times a day (TID) | CUTANEOUS | 0 refills | Status: DC

## 2020-08-08 NOTE — Assessment & Plan Note (Signed)
Rash at the follicles under his beard.  Told him to shave and use mupirocin three times a day.

## 2020-08-08 NOTE — Progress Notes (Signed)
   Subjective:    Patient ID: Larry Lutz, male    DOB: September 10, 1990, 30 y.o.   MRN: [id number]  HPI Here for a work in visit He has HIV and off of medication for 3 days. He has noted a rash where his beard is and stopped thinking it was from the [place].  He has been having rectal abscess and went to the ED and given valtrex and Augmentin.  Still with a rectal abscess.  No significant rectal pain, more itch/irritation.  He otherwise has been on his biktarvy for a 'while'.  Was in jail recently and had not been getting his medication then.  He reports being tested positive for hepatitis C and syphilis then.  He has not been in care for his UC, previously saw Dr. Loletha Carrow.  He has noted a rash on his partners penis and concerned he has syphilis.     Review of Systems  Constitutional: Negative for fatigue.  Gastrointestinal: Negative for diarrhea and nausea.  Skin: Negative for rash.       Objective:   Physical Exam Eyes:     General: No scleral icterus. Cardiovascular:     Rate and Rhythm: Normal rate and regular rhythm.  Pulmonary:     Effort: Pulmonary effort is normal.  Neurological:     General: No focal deficit present.     Mental Status: He is alert.  Psychiatric:        Mood and Affect: Mood normal.   SH: + tobacco        Assessment & Plan:

## 2020-08-08 NOTE — Assessment & Plan Note (Signed)
Not on any treatment.  Will refer him back to GI.  Will need [facility] card.

## 2020-08-08 NOTE — Assessment & Plan Note (Addendum)
Off his medication three days, symptomatic.  I told him Judd Lien is not the reason for his rash or rectal issues and needs to restart.  I will check his labs today to see how everything is.  He will return to his primary provider in 4 weeks.  Will check hepatitis C ab and RNA

## 2020-08-08 NOTE — Assessment & Plan Note (Signed)
Concern for syphilis and will treat empirically today with Bicillin and check his lab.  Will screen with swabs and urine as well.

## 2020-08-11 NOTE — Progress Notes (Signed)
Saw patient briefly before sending him to see Dr. Linus Salmons.

## 2020-08-12 ENCOUNTER — Other Ambulatory Visit: Payer: Self-pay | Admitting: Internal Medicine

## 2020-08-12 LAB — CYTOLOGY, (ORAL, ANAL, URETHRAL) ANCILLARY ONLY
Chlamydia: NEGATIVE
Chlamydia: POSITIVE — AB
Comment: NEGATIVE
Comment: NEGATIVE
Comment: NORMAL
Comment: NORMAL
Neisseria Gonorrhea: NEGATIVE
Neisseria Gonorrhea: NEGATIVE

## 2020-08-12 MED ORDER — AZITHROMYCIN 500 MG PO TABS: 1000.0000 mg | ORAL_TABLET | Freq: Once | ORAL | 0 refills | Status: AC

## 2020-08-12 NOTE — Telephone Encounter (Signed)
Patient returned call to triage and made aware of most recent labs results and notified of prescription sent pharmacy. Patient verbalized understanding without any additional concerns.  Larry Lutz

## 2020-08-12 NOTE — Telephone Encounter (Signed)
Opened in error

## 2020-08-12 NOTE — Telephone Encounter (Signed)
-----   Message from Thayer Headings, MD sent at 08/12/2020  3:59 PM EDT ----- Could you let him know he is positive for chlamydia and I have sent in azithromycin to his pharmacy. thanks

## 2020-08-12 NOTE — Telephone Encounter (Signed)
Attempted to reach out to patient regarding lab results. Also sent patient a MyChart message to contact [facility].  Larry Lutz

## 2020-08-13 ENCOUNTER — Other Ambulatory Visit: Payer: Self-pay

## 2020-08-18 IMAGING — US US SOFT TISSUE EXCLUDE HEAD/NECK
1 series · 9 of 9 positions shown · non-contrast
Comparison: None.

CLINICAL DATA: 28-year-old male with possible infection

EXAM:
ULTRASOUND OF THE CHEST
TECHNIQUE: Ultrasound examination of the head and neck soft tissues was
performed in the area of clinical concern.

[Series 1: us soft tissue exclude head/neck · 9 of 9 slices shown]
[im 1/9]
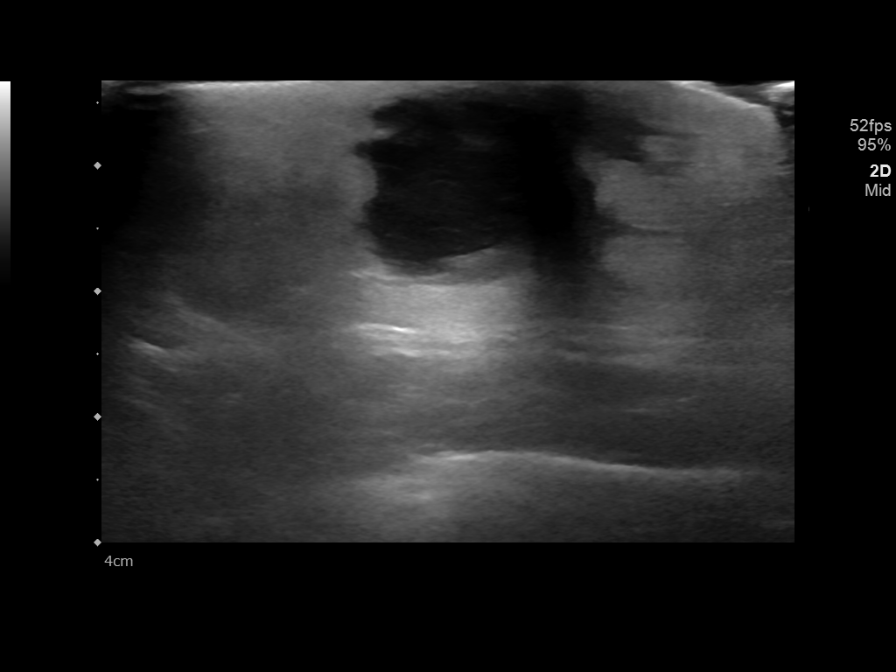
[im 2/9]
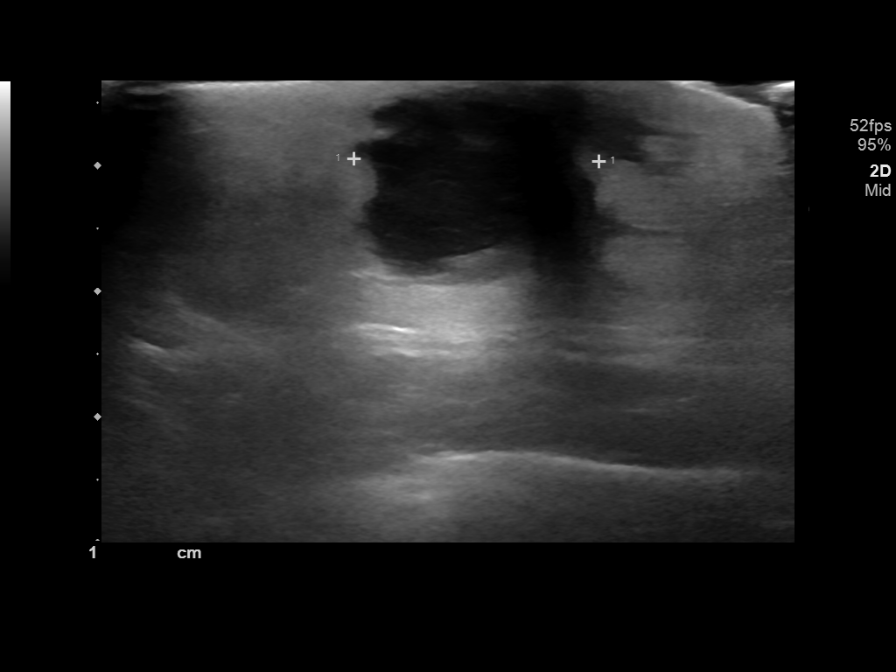
[im 3/9]
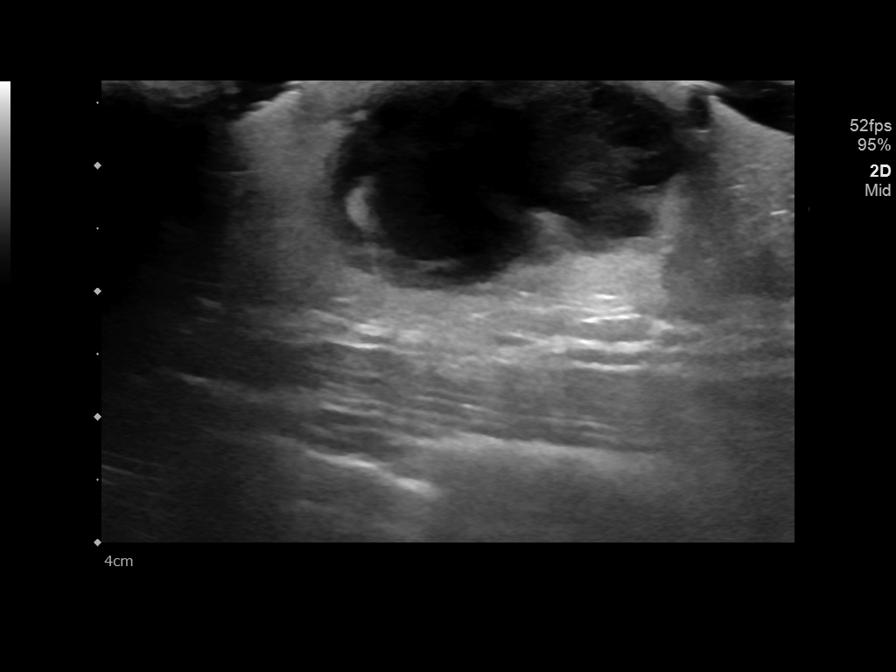
[im 4/9]
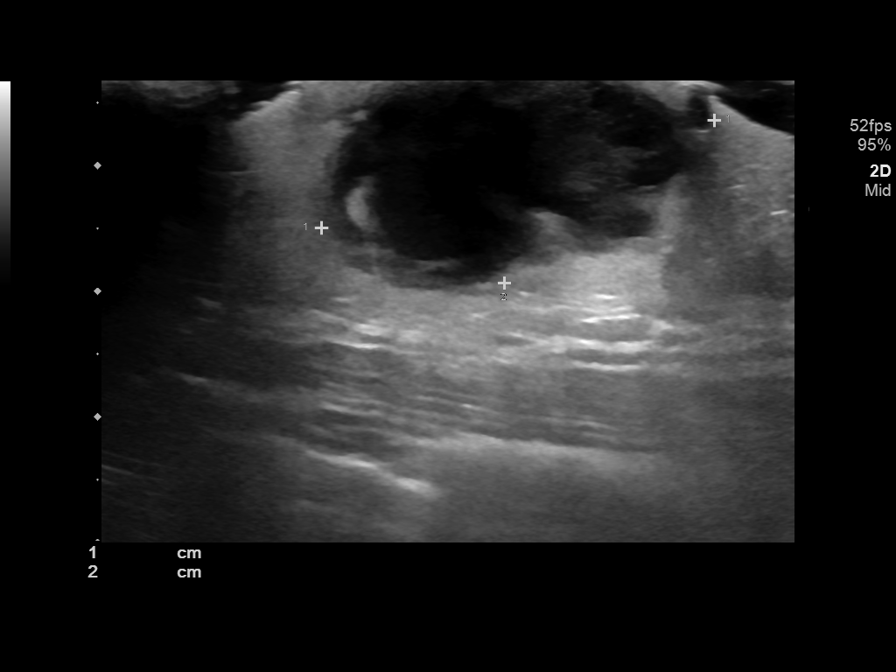
[im 5/9]
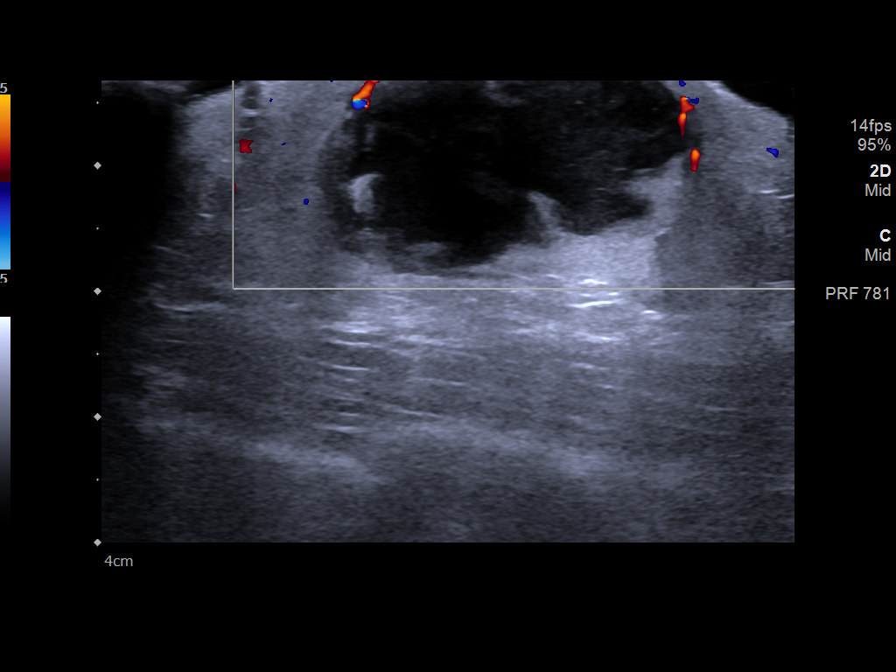
[im 6/9]
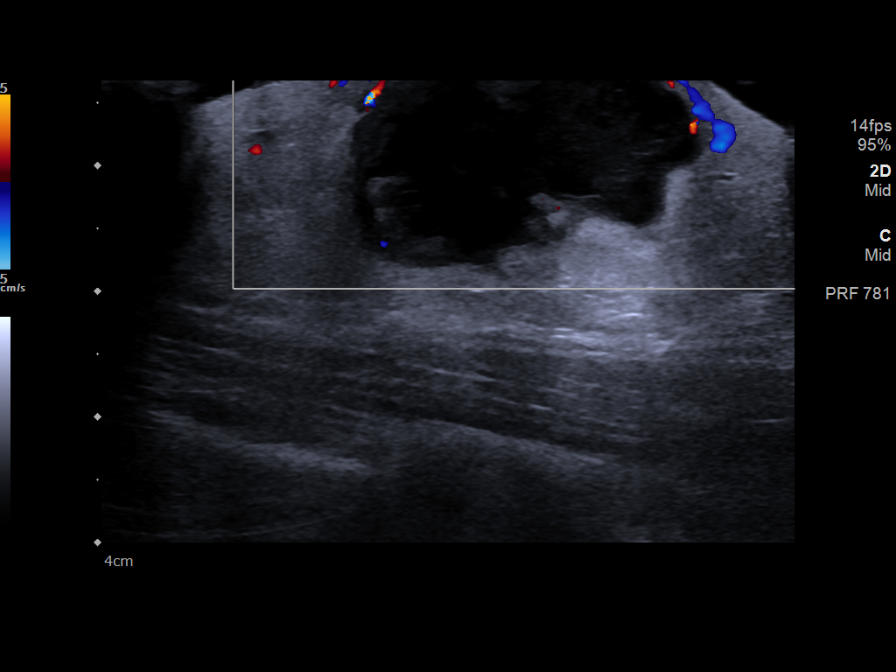
[im 7/9]
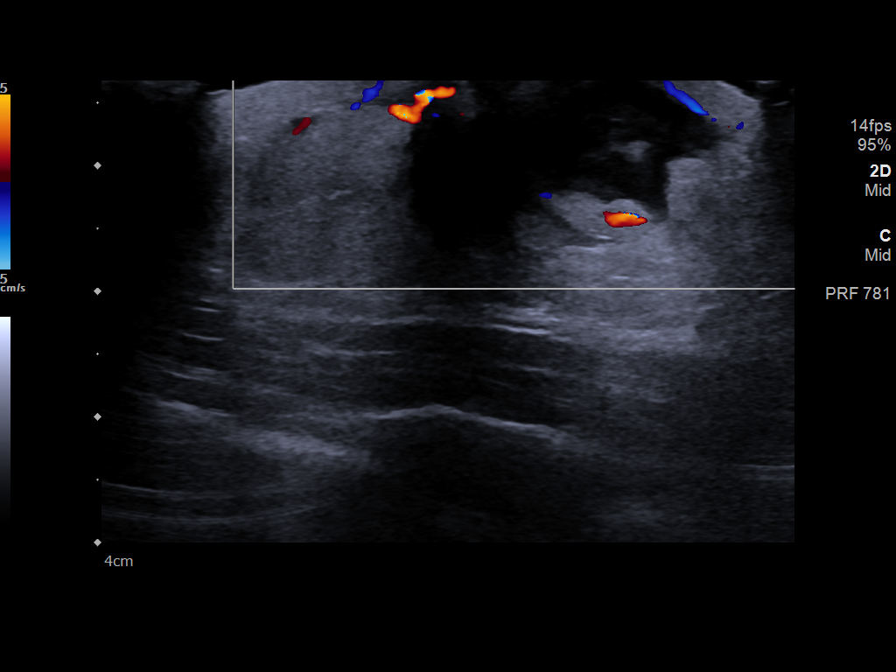
[im 8/9]
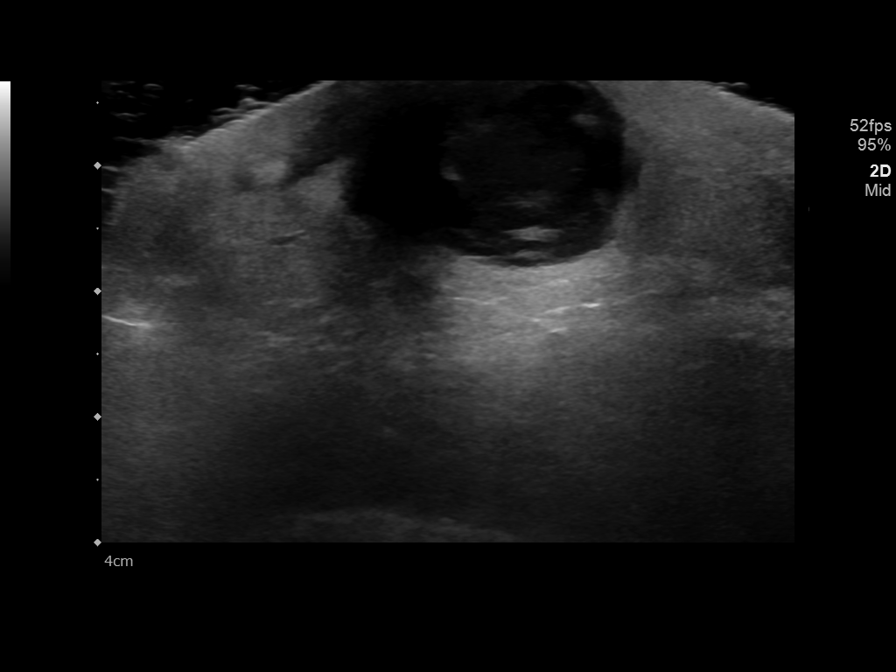
[im 9/9]
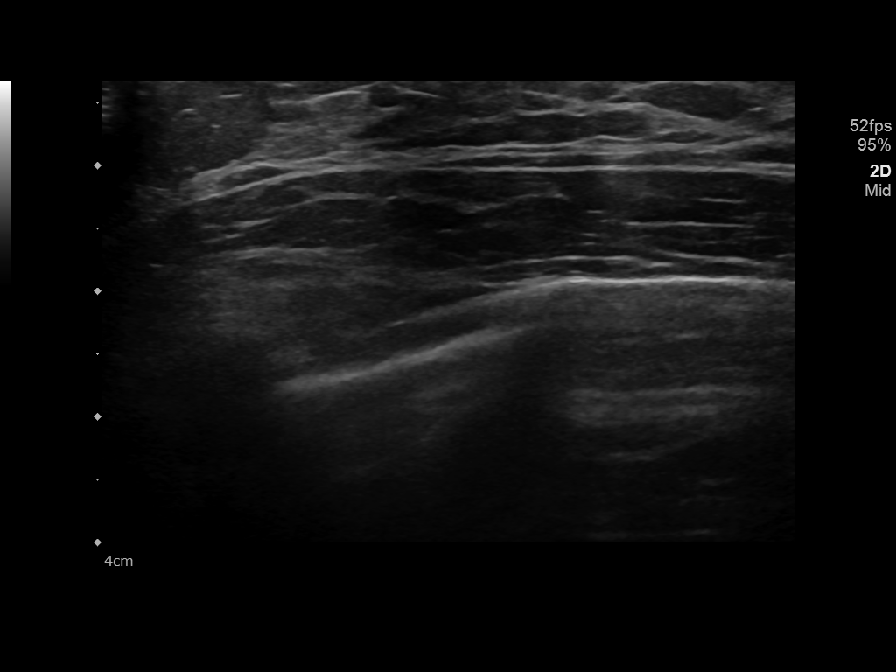

[9 of 9 positions shown; findings below may reference images not displayed]

FINDINGS: Limited grayscale and color duplex images in the region of clinical
concern on the right chest wall.

Hypoechoic fluid collection measures 3.2 cm x 2.0 cm x 1.8 cm.
Marginal color flow.
IMPRESSION: Limited ultrasound survey demonstrates fluid collection within the
anterior right chest wall soft tissues, compatible with abscess
given the history.

## 2020-08-18 IMAGING — CT CT ABD-PELV W/ CM
2 of 4 series · 14 of 46 positions shown, 16 images · IV contrast (ISOVUE)
Comparison: CT scan 05/20/2016

CLINICAL DATA: Rectal bleeding for 3 weeks.

EXAM:
CT ABDOMEN AND PELVIS WITH CONTRAST
TECHNIQUE: Multidetector CT imaging of the abdomen and pelvis was performed
using the standard protocol following bolus administration of
intravenous contrast.
CONTRAST:  100mL [id number] IOPAMIDOL ([id number]) INJECTION 61%

[Series 2: axial st · axial · 0.68mm/px · z∈[+966,+1366]mm · 11 of 90 slices shown, 13 images]
[im 5/90  soft-tissue]
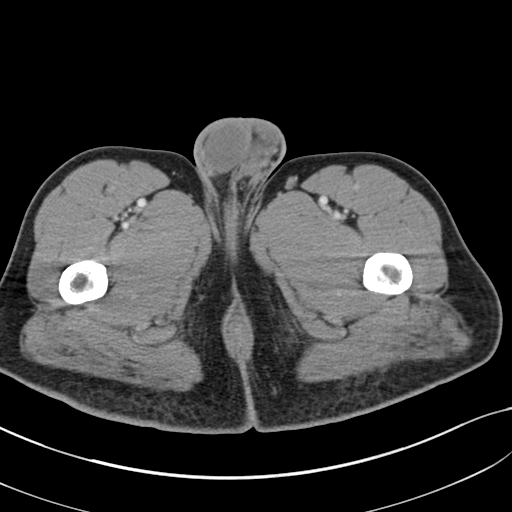
[im 5/90  bone]
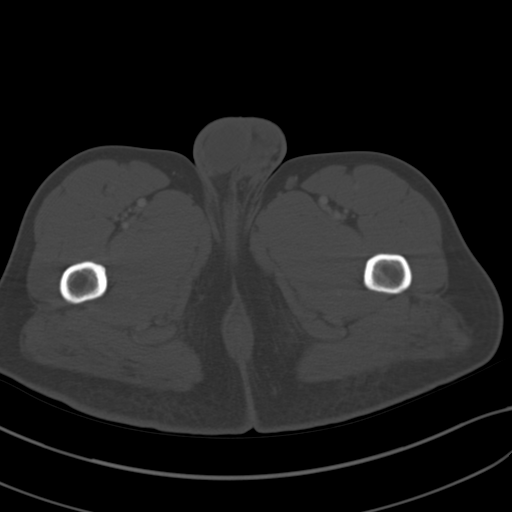
[im 13/90  soft-tissue]
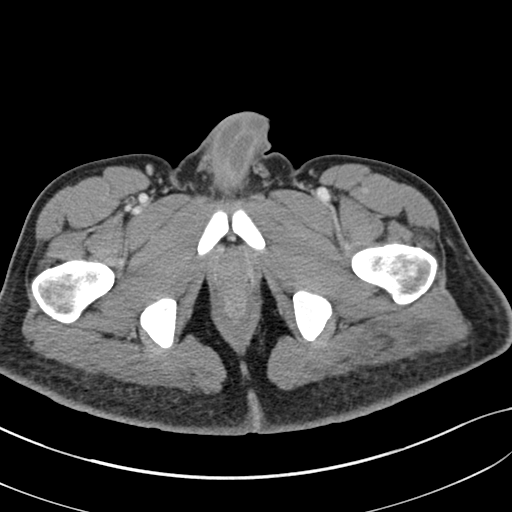
[im 22/90  soft-tissue]
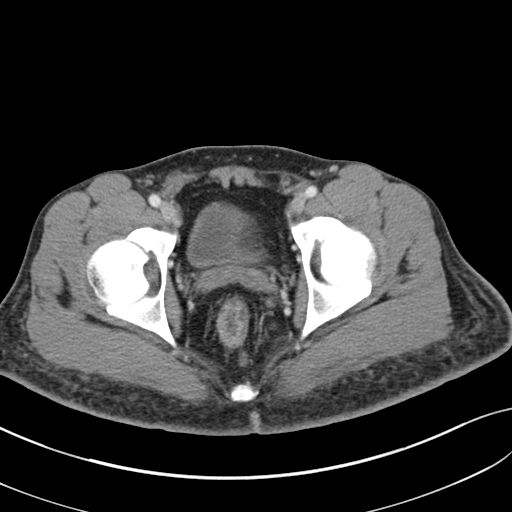
[im 30/90  soft-tissue]
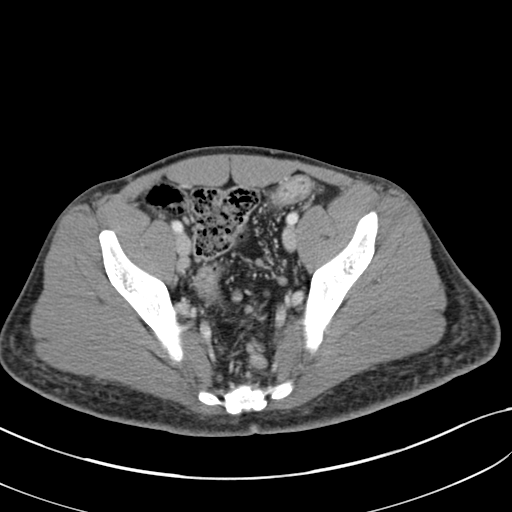
[im 39/90  soft-tissue]
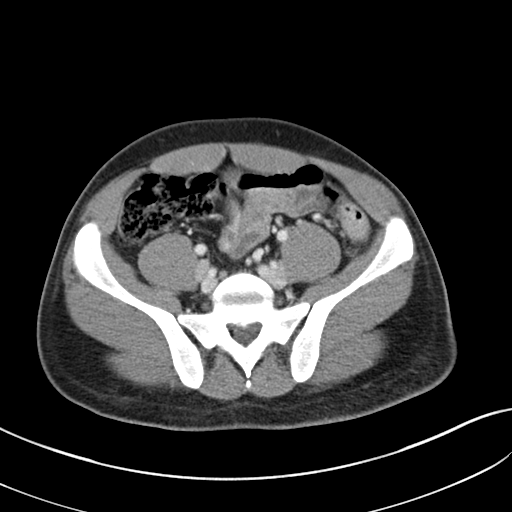
[im 47/90  soft-tissue]
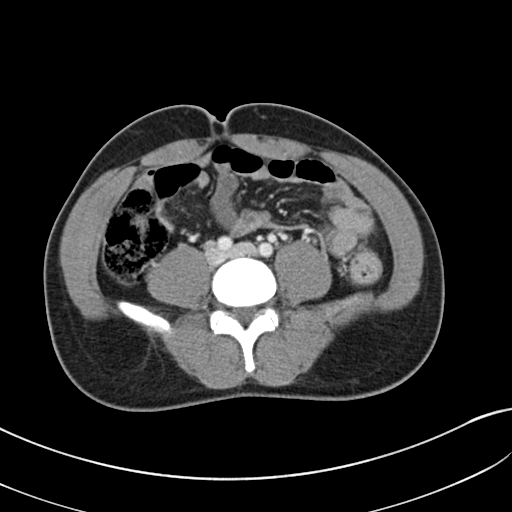
[im 51/90  soft-tissue]
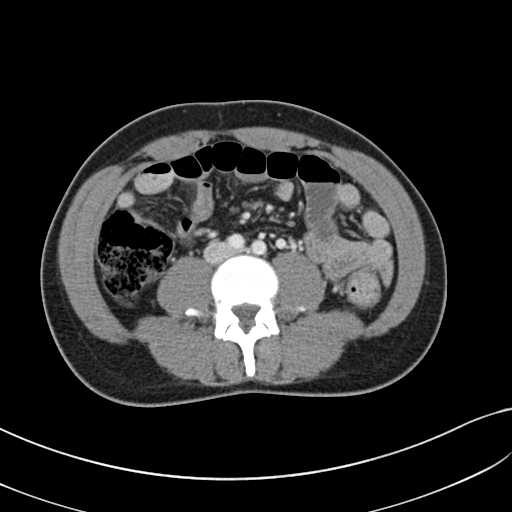
[im 60/90  soft-tissue]
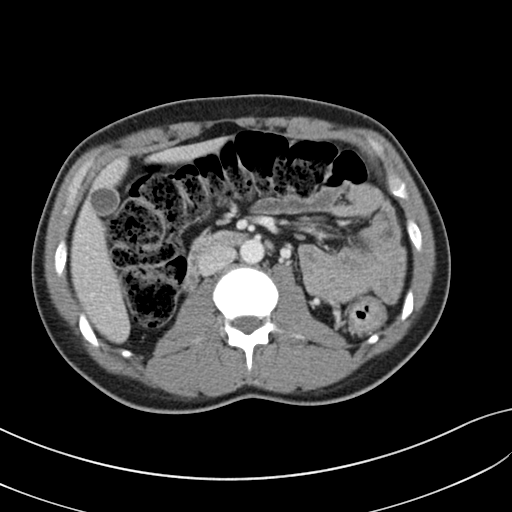
[im 68/90  soft-tissue]
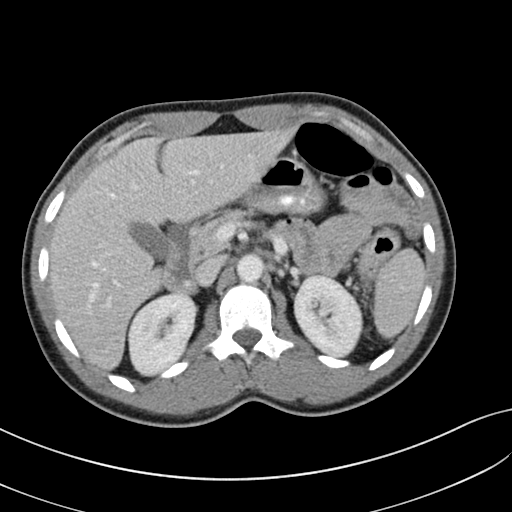
[im 68/90  bone]
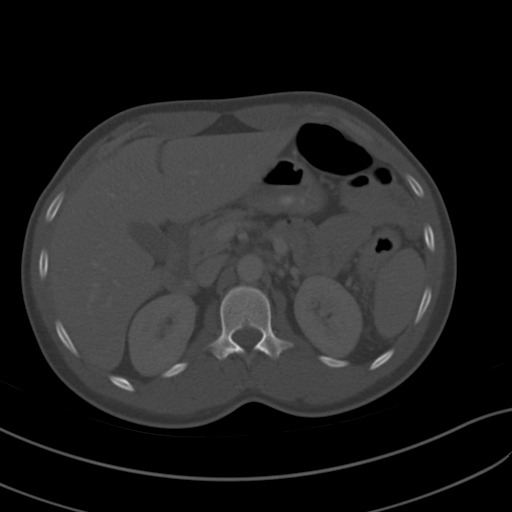
[im 77/90  soft-tissue]
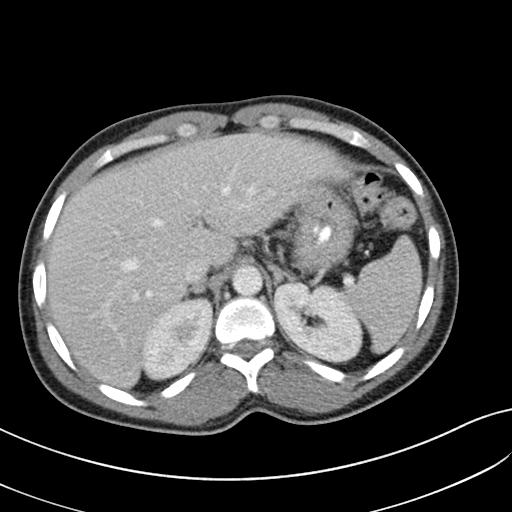
[im 85/90  soft-tissue]
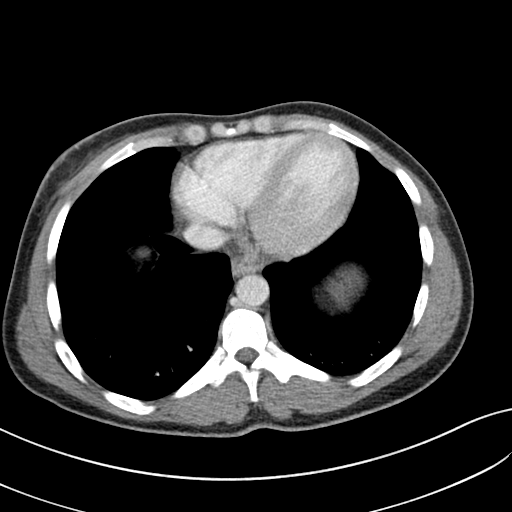

[Series 5: coronal st · coronal · 0.55mm/px · 3 of 107 slices shown]
[im 36/107  soft-tissue]
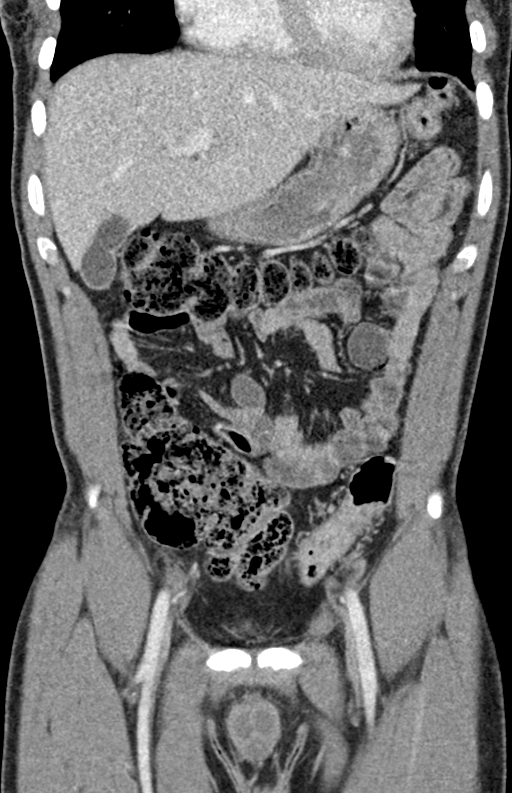
[im 48/107  soft-tissue]
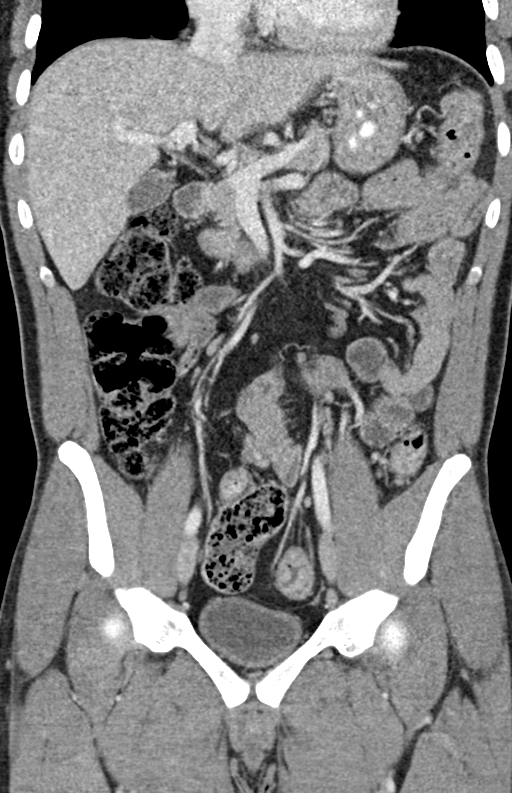
[im 59/107  soft-tissue]
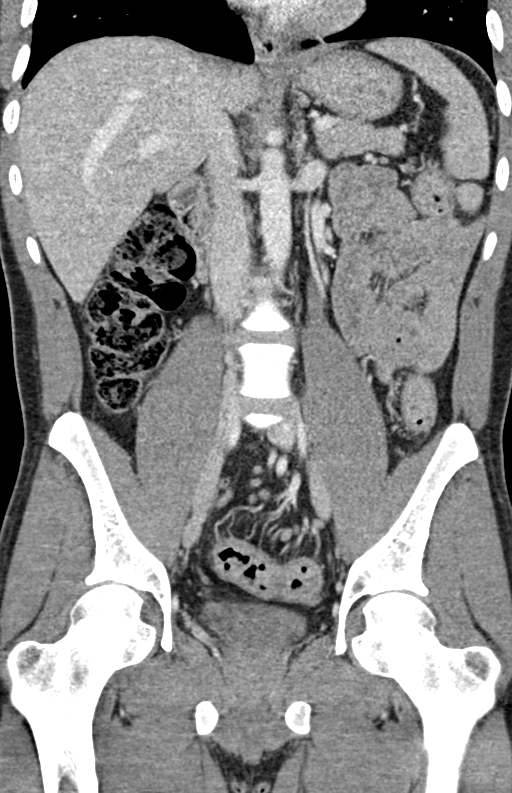

[14 of 46 positions shown; findings below may reference images not displayed]

FINDINGS: Lower chest: [DATE] x 2.0 cm rim enhancing fluid collection in the
right chest wall just below the right nipple worrisome for an
abscess.

The lung bases are clear. No pleural effusion or pericardial
effusion.

Hepatobiliary: No focal hepatic lesions or intrahepatic biliary
dilatation. The gallbladder is normal. No common bile duct
dilatation.

Pancreas: No mass, inflammation or ductal dilatation.

Spleen: Normal size.  No focal lesions.

Adrenals/Urinary Tract: The adrenal glands and kidneys are
unremarkable. The bladder is normal.

Stomach/Bowel: The stomach, duodenum and small bowel are
unremarkable. No inflammatory changes or mass lesions or obstructive
findings. The terminal ileum is normal. Low lying cecum deep in the
pelvis. The appendix is normal.

Suspect a diffuse inflammatory process involving the descending
colon, sigmoid colon and rectum with mucosal enhancement and mild
submucosal edema. There are also numerous small perirectal lymph
nodes along with numerous small lymph nodes in the sigmoid
mesocolon. This is very similar to the patient's prior CT scan from
3016. No mass or obstructive findings.

Vascular/Lymphatic: The aorta and branch vessels are normal. The
major venous structures are patent. Small scattered mesenteric and
retroperitoneal lymph nodes. As described above there are numerous
small lymph nodes in the sigmoid mesocolon and perirectal areas
consistent with an inflammatory or infectious process. No rectal or
perirectal abscess and no perianal abscess.

Reproductive: The prostate gland and seminal vesicles are
unremarkable.

Other: No pelvic mass or adenopathy. No free pelvic fluid
collections. No inguinal mass or adenopathy. No abdominal wall
hernia or subcutaneous lesions.

Musculoskeletal: No significant bony findings.
IMPRESSION: 1. Suspect diffuse inflammatory or infectious colitis involving the
rectum and descending colon and sigmoid colon with numerous small
perirectal and sigmoid mesocolon lymph nodes, likely
inflammatory/hyperplastic. This looks very similar to the patient's
prior CT scan. Possible ulcerative colitis.
2. No perirectal abscess or colonic mass.
3. 2.8 x 2.0 cm rim enhancing fluid collection near the right nipple
suspicious for abscess.

## 2020-09-12 ENCOUNTER — Other Ambulatory Visit: Payer: Self-pay | Admitting: Infectious Diseases

## 2020-11-14 ENCOUNTER — Ambulatory Visit: Payer: Self-pay | Admitting: Internal Medicine

## 2021-06-15 ENCOUNTER — Ambulatory Visit (INDEPENDENT_AMBULATORY_CARE_PROVIDER_SITE_OTHER): Admitting: Infectious Diseases

## 2021-06-15 ENCOUNTER — Other Ambulatory Visit: Payer: Self-pay

## 2021-06-15 VITALS — BP 146/95 | HR 73 | Resp 16 | Ht 68.0 in | Wt 161.2 lb

## 2021-06-15 DIAGNOSIS — B20 Human immunodeficiency virus [HIV] disease: Secondary | ICD-10-CM

## 2021-06-15 DIAGNOSIS — Z202 Contact with and (suspected) exposure to infections with a predominantly sexual mode of transmission: Secondary | ICD-10-CM

## 2021-06-15 DIAGNOSIS — K604 Rectal fistula: Secondary | ICD-10-CM | POA: Diagnosis not present

## 2021-06-15 DIAGNOSIS — F418 Other specified anxiety disorders: Secondary | ICD-10-CM

## 2021-06-15 MED ORDER — ESCITALOPRAM OXALATE 10 MG PO TABS: 10.0000 mg | ORAL_TABLET | Freq: Every day | ORAL | 2 refills | Status: DC

## 2021-06-15 NOTE — Assessment & Plan Note (Signed)
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  ?

## 2021-06-15 NOTE — Progress Notes (Signed)
? ?Patient Name: Larry Lutz  ?DOB: 02-05-1991  ?MRN: [id number]  ? ? ?SUBJECTIVE: ?Brief Narrative:  ?Travious is a 31 y.o. AA male with HIV infection. Originally diagnosed with HIV in 2017 in [place] (no records available). Inconsistent with medication adherence. ?HIV Risk: MSM ?History of OIs: none ? ? ?Previous Regimen:  ?Biktarvy >> suppressed  ? ?Genotype:  ?wild type 12/2016 ? ? ?CC: ?HIV follow up care --> off medications  ?Severe anxiety / depression lately  ?Legal problems ? ? ? ?HPI:  ?Here today for follow up HIV. LOV with me in 2021. Saw Dr. Linus Salmons after ER visit for possible STI and perirectal fistula in May 2022.  ?Here today with [facility] guards.  ? ?Since February he has been on biktarvy everyday without any misses.  ?Prior to that he was very sporadic in medication adherence. No consistency in medication practice at all. Was previously with a toxic male partner whom he has no further contact with now. He thinks this is reasonable for him to maintain.  ?Planned release April 17th.  ? ?Taking remeron and finds this very helpful. The zoloft is making him throw up every morning. Wondering if we can switch this.  ? ?ER visit for perirectal abscess April 2022. Tx with doxycycline + augmentin for (+) Clamydia and perirectal abscess. Still with persistent drainage from hole. No pain or tenderness per say but just a lot of drainage constantly.  ? ? ?Review of Systems  ?Constitutional:  Negative for appetite change, chills, fatigue, fever and unexpected weight change.  ?Eyes:  Negative for visual disturbance.  ?Respiratory:  Negative for cough and shortness of breath.   ?Cardiovascular:  Negative for chest pain and leg swelling.  ?Gastrointestinal:  Negative for abdominal pain, diarrhea and nausea.  ?Genitourinary:  Negative for dysuria, genital sores and penile discharge.  ?Musculoskeletal:  Negative for joint swelling.  ?Skin:  Positive for wound. Negative for color change and rash.  ?Neurological:  Negative for  dizziness and headaches.  ?Hematological:  Negative for adenopathy.  ?Psychiatric/Behavioral:  Positive for dysphoric mood. Negative for sleep disturbance. The patient is not nervous/anxious.   ? ? ?Past Medical History:  ?Diagnosis Date  ? Anxiety   ? Asthma   ? Colitis   ? Colon polyp   ? Crohn disease ([place])   ? Depressed   ? GI bleed   ? HIV (human immunodeficiency virus infection) ([place])   ? Hypertension   ? IBS (irritable bowel syndrome)   ? UC (ulcerative colitis) ([place])   ? ?Allergies  ?Allergen Reactions  ? Morphine And Related   ?  Caused him to itch  ?  ? ?Outpatient Medications Prior to Visit  ?Medication Sig Dispense Refill  ? bictegravir-emtricitabine-tenofovir AF (BIKTARVY) 50-200-25 MG TABS tablet Take 1 tablet by mouth daily. 30 tablet 5  ? lisinopril (PRINIVIL) 10 MG tablet Take 1 tablet (10 mg total) by mouth daily. 30 tablet 1  ? mirtazapine (REMERON) 30 MG tablet Take 30 mg by mouth at bedtime.    ? sertraline (ZOLOFT) 50 MG tablet Take 50 mg by mouth daily.    ? Mesalamine (ASACOL) 400 MG CPDR DR capsule Take 2 capsules (800 mg total) by mouth 3 (three) times daily. (Patient not taking: Reported on 06/15/2021) 180 capsule 6  ? naproxen (NAPROSYN) 375 MG tablet Take 1 tablet (375 mg total) by mouth 2 (two) times daily with a meal. (Patient not taking: Reported on 06/15/2021) 10 tablet 0  ? omeprazole (PRILOSEC) 40  MG capsule Take 1 capsule (40 mg total) by mouth daily. (Patient not taking: Reported on 06/15/2021) 14 capsule 1  ? amoxicillin-clavulanate (AUGMENTIN) 875-125 MG tablet Take 1 tablet by mouth every 12 (twelve) hours. (Patient not taking: Reported on 06/15/2021) 20 tablet 0  ? mesalamine (LIALDA) 1.2 g EC tablet Take 4 tablets (4.8 g total) by mouth daily with breakfast for 30 days. (Patient not taking: Reported on 05/16/2018) 120 tablet 0  ? mupirocin ointment (BACTROBAN) 2 % Apply 1 application topically 3 (three) times daily. To face (Patient not taking: Reported on 06/15/2021) 22 g 0  ?  valACYclovir (VALTREX) 1000 MG tablet Take 1 tablet (1,000 mg total) by mouth 3 (three) times daily. (Patient not taking: Reported on 06/15/2021) 21 tablet 0  ? ?No facility-administered medications prior to visit.  ? ? ?Social History  ? ?Tobacco Use  ? Smoking status: Every Day  ? Smokeless tobacco: Never  ?Vaping Use  ? Vaping Use: Never used  ?Substance Use Topics  ? Alcohol use: No  ? Drug use: Yes  ?  Types: Marijuana  ? ? ?Physical Exam and Objective Findings:  ?Vitals:  ? 06/15/21 1108  ?BP: (!) 146/95  ?Pulse: 73  ?Resp: 16  ?SpO2: 100%  ?Weight: 161 lb 3.2 oz (73.1 kg)  ?Height: 5' 8"  (1.727 m)  ? ?Body mass index is 24.51 kg/m?. ?**Patient refused vital signs.  ? ? ?Physical Exam ?Constitutional:   ?   Appearance: Normal appearance. He is not ill-appearing.  ?HENT:  ?   Head: Normocephalic.  ?   Mouth/Throat:  ?   Mouth: Mucous membranes are moist.  ?   Pharynx: Oropharynx is clear.  ?Eyes:  ?   General: No scleral icterus. ?Cardiovascular:  ?   Rate and Rhythm: Normal rate.  ?Pulmonary:  ?   Effort: Pulmonary effort is normal.  ?Musculoskeletal:     ?   General: Normal range of motion.  ?   Cervical back: Normal range of motion.  ?Skin: ?   Coloration: Skin is not jaundiced or pale.  ?Neurological:  ?   Mental Status: He is alert and oriented to person, place, and time.  ?Psychiatric:     ?   Mood and Affect: Mood normal.     ?   Judgment: Judgment normal.  ? ? ?Lab Results ?Lab Results  ?Component Value Date  ? WBC 8.0 06/27/2020  ? HGB 10.4 (L) 06/27/2020  ? HCT 36.1 (L) 06/27/2020  ? MCV 61.9 (L) 06/27/2020  ? PLT 443 (H) 06/27/2020  ?  ?Lab Results  ?Component Value Date  ? CREATININE 0.90 06/27/2020  ? BUN 21 (H) 06/27/2020  ? NA 141 06/27/2020  ? K 3.8 06/27/2020  ? CL 107 06/27/2020  ? CO2 26 06/27/2020  ?  ?Lab Results  ?Component Value Date  ? ALT 12 08/15/2018  ? AST 20 08/15/2018  ? ALKPHOS 71 05/04/2018  ? BILITOT 0.6 08/15/2018  ?  ?Lab Results  ?Component Value Date  ? CHOL 236 (H) 01/05/2017   ? HDL 45 01/05/2017  ? LDLCALC 163 (H) 01/05/2017  ? TRIG 138 01/05/2017  ? CHOLHDL 5.2 (H) 01/05/2017  ? ?HIV 1 RNA Quant (copies/mL)  ?Date Value  ?08/15/2018 176 (H)  ?05/17/2018 83 (H)  ?02/22/2018 25 (H)  ? ?CD4 T Cell Abs (/uL)  ?Date Value  ?08/15/2018 701  ?05/17/2018 470  ?02/22/2018 690  ? ? ?ASSESSMENT & PLAN:   ? ?Problem List Items Addressed This Visit   ? ?  ?  Unprioritized  ? Human immunodeficiency virus (HIV) disease ([place]) - Primary (Chronic)  ?  Pretty spotty adherence for a long time with Biktarvy. Counseled today on how to correctly take this for long term benefit and hopes to avoid emergence of treatment resistance.  ?Will check genotype today and CD4. Presume his CD4 is still probably preserved with last known 700 1 year ago.  ?RTC upon release to coordinate the rest of his care or in 46mif still in detention center.  ?  ?  ? Relevant Orders  ? COMPLETE METABOLIC PANEL WITH GFR  ? CBC with Differential/Platelet  ? [facility]  ? HIV RNA, RTPCR W/R GT (RTI, PI,INT)  ? Possible exposure to STD  ? Relevant Orders  ? RPR  ? Depression with anxiety  ?  Continue mirtazapine 30 mg QHS ?Switch zoloft to lexapro 10 mg QHS also to see if he tolerates this better from GI s/e experience.  ?Will follow up in 4 weeks after he is released.  ?  ?  ? Relevant Medications  ? mirtazapine (REMERON) 30 MG tablet  ? escitalopram (LEXAPRO) 10 MG tablet  ? Perirectal fistula, suspected   ?  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  ?  ?  ? ? ?SJanene Madeira MSN, NP-C ?[facility] ?Pager: [phone]? ? ?

## 2021-06-15 NOTE — Assessment & Plan Note (Signed)
Pretty spotty adherence for a long time with Biktarvy. Counseled today on how to correctly take this for long term benefit and hopes to avoid emergence of treatment resistance.  ?Will check genotype today and CD4. Presume his CD4 is still probably preserved with last known 700 1 year ago.  ?RTC upon release to coordinate the rest of his care or in 56mif still in detention center.  ?

## 2021-06-15 NOTE — Assessment & Plan Note (Signed)
Continue mirtazapine 30 mg QHS ?Switch zoloft to lexapro 10 mg QHS also to see if he tolerates this better from GI s/e experience.  ?Will follow up in 4 weeks after he is released.  ?

## 2021-06-16 LAB — T-HELPER CELLS (CD4) COUNT (NOT AT ARMC)
CD4 % Helper T Cell: 13 % — ABNORMAL LOW (ref 33–65)
CD4 T Cell Abs: 440 /uL (ref 400–1790)

## 2021-06-18 LAB — COMPLETE METABOLIC PANEL WITH GFR
AG Ratio: 0.7 (calc) — ABNORMAL LOW (ref 1.0–2.5)
ALT: 12 U/L (ref 9–46)
AST: 16 U/L (ref 10–40)
Albumin: 3.8 g/dL (ref 3.6–5.1)
Alkaline phosphatase (APISO): 84 U/L (ref 36–130)
BUN: 11 mg/dL (ref 7–25)
CO2: 27 mmol/L (ref 20–32)
Calcium: 9.8 mg/dL (ref 8.6–10.3)
Chloride: 101 mmol/L (ref 98–110)
Creat: 1.02 mg/dL (ref 0.60–1.26)
Globulin: 5.3 g/dL (calc) — ABNORMAL HIGH (ref 1.9–3.7)
Glucose, Bld: 77 mg/dL (ref 65–99)
Potassium: 4.6 mmol/L (ref 3.5–5.3)
Sodium: 138 mmol/L (ref 135–146)
Total Bilirubin: 0.4 mg/dL (ref 0.2–1.2)
Total Protein: 9.1 g/dL — ABNORMAL HIGH (ref 6.1–8.1)
eGFR: 100 mL/min/{1.73_m2} (ref >=60)

## 2021-06-18 LAB — CBC WITH DIFFERENTIAL/PLATELET
Absolute Monocytes: 752 cells/uL (ref 200–950)
Basophils Absolute: 56 cells/uL (ref 0–200)
Basophils Relative: 0.6 %
Eosinophils Absolute: 611 cells/uL — ABNORMAL HIGH (ref 15–500)
Eosinophils Relative: 6.5 %
HCT: 34.8 % — ABNORMAL LOW (ref 38.5–50.0)
Hemoglobin: 10.5 g/dL — ABNORMAL LOW (ref 13.2–17.1)
Lymphs Abs: 3769 cells/uL (ref 850–3900)
MCH: 20 pg — ABNORMAL LOW (ref 27.0–33.0)
MCHC: 30.2 g/dL — ABNORMAL LOW (ref 32.0–36.0)
MCV: 66.4 fL — ABNORMAL LOW (ref 80.0–100.0)
MPV: 9.5 fL (ref 7.5–12.5)
Monocytes Relative: 8 %
Neutro Abs: 4211 cells/uL (ref 1500–7800)
Neutrophils Relative %: 44.8 %
Platelets: 525 10*3/uL — ABNORMAL HIGH (ref 140–400)
RBC: 5.24 10*6/uL (ref 4.20–5.80)
RDW: 19.9 % — ABNORMAL HIGH (ref 11.0–15.0)
Total Lymphocyte: 40.1 %
WBC: 9.4 10*3/uL (ref 3.8–10.8)

## 2021-06-18 LAB — RPR: RPR Ser Ql: NONREACTIVE

## 2021-06-18 LAB — HIV RNA, RTPCR W/R GT (RTI, PI,INT)
HIV 1 RNA Quant: 84 copies/mL — ABNORMAL HIGH
HIV-1 RNA Quant, Log: 1.92 Log copies/mL — ABNORMAL HIGH

## 2021-07-10 ENCOUNTER — Other Ambulatory Visit: Payer: Self-pay

## 2021-07-10 ENCOUNTER — Telehealth: Payer: Self-pay

## 2021-07-10 DIAGNOSIS — B20 Human immunodeficiency virus [HIV] disease: Secondary | ICD-10-CM

## 2021-07-10 MED ORDER — BIKTARVY 50-200-25 MG PO TABS: 1.0000 | ORAL_TABLET | Freq: Every day | ORAL | 5 refills | Status: DC

## 2021-07-10 NOTE — Telephone Encounter (Signed)
Received call today from [place], Water quality scientist at [facility] requesting refills for Larry Lutz. Refill sent to [facility] speciality in [place]. ?Leatrice Jewels, RMA  ?

## 2021-10-08 NOTE — Progress Notes (Addendum)
Patient Name: Larry Lutz  DOB: 10/27/1990  MRN: [id number]     SUBJECTIVE: Brief Narrative:  Larry Lutz is a 31 y.o. AA male with HIV infection. Originally diagnosed with HIV in 2017 in [place] (no records available). Inconsistent with medication adherence. HIV Risk: MSM History of OIs: none   Previous Regimen:  Biktarvy >> suppressed   Genotype:  wild type 12/2016   CC: HIV follow up care    HPI:  Here with 2 guards.  He is doing pretty well. Has been taking his biktarvy everyday without any lapse in medication. He was asking about Dovato or Juluca as alternative options for treatment if they may work better for him.    Still on zoloft - works OK but still with GI side effects he would prefer to avoid. Still with perianal leakage from fistula. Uses pads at times when he can get them but needs surgery referral when he is out.    Review of Systems  Constitutional:  Negative for appetite change, chills, fatigue, fever and unexpected weight change.  Eyes:  Negative for visual disturbance.  Respiratory:  Negative for cough and shortness of breath.   Cardiovascular:  Negative for chest pain and leg swelling.  Gastrointestinal:  Negative for abdominal pain, diarrhea and nausea.  Genitourinary:  Negative for dysuria, genital sores and penile discharge.  Musculoskeletal:  Negative for joint swelling.  Skin:  Negative for color change and rash.  Neurological:  Negative for dizziness and headaches.  Hematological:  Negative for adenopathy.  Psychiatric/Behavioral:  Negative for sleep disturbance. The patient is not nervous/anxious.      Past Medical History:  Diagnosis Date   Anxiety    Asthma    Colitis    Colon polyp    Crohn disease ([place])    Depressed    GI bleed    HIV (human immunodeficiency virus infection) ([place])    Hypertension    IBS (irritable bowel syndrome)    UC (ulcerative colitis) ([facility])    Allergies  Allergen Reactions   Morphine And Related      Caused him to itch     Outpatient Medications Prior to Visit  Medication Sig Dispense Refill   bictegravir-emtricitabine-tenofovir AF (BIKTARVY) 50-200-25 MG TABS tablet Take 1 tablet by mouth daily. 30 tablet 5   hydrochlorothiazide (MICROZIDE) 12.5 MG capsule Take 12.5 mg by mouth daily.     lisinopril (ZESTRIL) 20 MG tablet Take 20 mg by mouth daily.     mesalamine (LIALDA) 1.2 g EC tablet Take 4.8 g by mouth daily with breakfast.     mirtazapine (REMERON) 30 MG tablet Take 30 mg by mouth at bedtime.     sertraline (ZOLOFT) 100 MG tablet Take 100 mg by mouth daily.     lisinopril (PRINIVIL) 10 MG tablet Take 1 tablet (10 mg total) by mouth daily. (Patient taking differently: Take 20 mg by mouth daily.) 30 tablet 1   escitalopram (LEXAPRO) 10 MG tablet Take 1 tablet (10 mg total) by mouth daily. (Patient not taking: Reported on 10/09/2021) 30 tablet 2   Mesalamine (ASACOL) 400 MG CPDR DR capsule Take 2 capsules (800 mg total) by mouth 3 (three) times daily. (Patient not taking: Reported on 06/15/2021) 180 capsule 6   naproxen (NAPROSYN) 375 MG tablet Take 1 tablet (375 mg total) by mouth 2 (two) times daily with a meal. (Patient not taking: Reported on 06/15/2021) 10 tablet 0   omeprazole (PRILOSEC) 40 MG capsule Take 1 capsule (40 mg  total) by mouth daily. (Patient not taking: Reported on 06/15/2021) 14 capsule 1   No facility-administered medications prior to visit.    Social History   Tobacco Use   Smoking status: Every Day   Smokeless tobacco: Never  Vaping Use   Vaping Use: Never used  Substance Use Topics   Alcohol use: No   Drug use: Yes    Types: Marijuana    Physical Exam and Objective Findings:  Vitals:   10/09/21 0845  BP: 136/87  Pulse: 69  Resp: 16  Temp: 98.1 F (36.7 C)  TempSrc: Temporal  SpO2: 100%   There is no height or weight on file to calculate BMI. **Patient refused vital signs.    Physical Exam Constitutional:      Appearance: Normal appearance. He  is not ill-appearing.  HENT:     Head: Normocephalic.     Mouth/Throat:     Mouth: Mucous membranes are moist.     Pharynx: Oropharynx is clear.  Eyes:     General: No scleral icterus. Cardiovascular:     Rate and Rhythm: Normal rate.  Pulmonary:     Effort: Pulmonary effort is normal.  Musculoskeletal:        General: Normal range of motion.     Cervical back: Normal range of motion.  Skin:    Coloration: Skin is not jaundiced or pale.  Neurological:     Mental Status: He is alert and oriented to person, place, and time.  Psychiatric:        Mood and Affect: Mood normal.        Judgment: Judgment normal.     Lab Results Lab Results  Component Value Date   WBC 9.4 06/15/2021   HGB 10.5 (L) 06/15/2021   HCT 34.8 (L) 06/15/2021   MCV 66.4 (L) 06/15/2021   PLT 525 (H) 06/15/2021    Lab Results  Component Value Date   CREATININE 1.02 06/15/2021   BUN 11 06/15/2021   NA 138 06/15/2021   K 4.6 06/15/2021   CL 101 06/15/2021   CO2 27 06/15/2021    Lab Results  Component Value Date   ALT 12 06/15/2021   AST 16 06/15/2021   ALKPHOS 71 05/04/2018   BILITOT 0.4 06/15/2021    Lab Results  Component Value Date   CHOL 236 (H) 01/05/2017   HDL 45 01/05/2017   LDLCALC 163 (H) 01/05/2017   TRIG 138 01/05/2017   CHOLHDL 5.2 (H) 01/05/2017   HIV 1 RNA Quant (copies/mL)  Date Value  06/15/2021 84 (H)  08/15/2018 176 (H)  05/17/2018 83 (H)   CD4 T Cell Abs (/uL)  Date Value  06/15/2021 440  08/15/2018 701  05/17/2018 470    ASSESSMENT & PLAN:    Problem List Items Addressed This Visit       Unprioritized   Human immunodeficiency virus (HIV) disease ([place]) - Primary (Chronic)    Very well controlled on once daily Biktarvy while he has been in a supervised setting. Plans for release in Mid August. His adherence has been spotty in the past. I think best to continue biktarvy for him at this time to ensure higher barrier to resistance. We discussed labs last visit  in April - VL 85 and CD4 > 450. Will repeat VL today Will need to get him back here at release to help with any renewals for ADAP and coordination of his care.  Vaccines updated today - see health maintenance section.   FU  in September after he is released recommended.        Relevant Orders   HIV 1 RNA quant-no reflex-bld   Perirectal fistula, suspected     Plan referral to gen surgery at [facility] to evaluate chronic draining fisula after abscess once released.       Hypertension    Continue lisinopril 20 mg and hctz 12.5 mg  Will look into consolidating to single tablet at release since they are working well at current dose. BP today 136/83 and per jail records SBP typically stays < 130      Relevant Medications   hydrochlorothiazide (MICROZIDE) 12.5 MG capsule   lisinopril (ZESTRIL) 20 MG tablet   Healthcare maintenance    Menveo booster today Influenza vaccine at next visit in Sept if available then.  Discussed HMPX vaccine - he may be interested once he is released.       Other Visit Diagnoses     Need for meningitis vaccination       Relevant Orders   MENINGOCOCCAL MCV4O(MENVEO) (Completed)      Janene Madeira, MSN, NP-C [facility] Pager: [phone]

## 2021-10-09 ENCOUNTER — Other Ambulatory Visit: Payer: Self-pay

## 2021-10-09 ENCOUNTER — Encounter: Payer: Self-pay | Admitting: Infectious Diseases

## 2021-10-09 ENCOUNTER — Ambulatory Visit (INDEPENDENT_AMBULATORY_CARE_PROVIDER_SITE_OTHER): Admitting: Infectious Diseases

## 2021-10-09 VITALS — BP 136/87 | HR 69 | Temp 98.1°F | Resp 16

## 2021-10-09 DIAGNOSIS — Z0000 Encounter for general adult medical examination without abnormal findings: Secondary | ICD-10-CM | POA: Insufficient documentation

## 2021-10-09 DIAGNOSIS — Z23 Encounter for immunization: Secondary | ICD-10-CM | POA: Diagnosis not present

## 2021-10-09 DIAGNOSIS — B20 Human immunodeficiency virus [HIV] disease: Secondary | ICD-10-CM

## 2021-10-09 DIAGNOSIS — I10 Essential (primary) hypertension: Secondary | ICD-10-CM | POA: Diagnosis not present

## 2021-10-09 DIAGNOSIS — K604 Rectal fistula: Secondary | ICD-10-CM | POA: Diagnosis not present

## 2021-10-09 NOTE — Assessment & Plan Note (Signed)
Plan referral to gen surgery at [facility] to evaluate chronic draining fisula after abscess once released.

## 2021-10-09 NOTE — Assessment & Plan Note (Addendum)
Very well controlled on once daily Biktarvy while he has been in a supervised setting. Plans for release in Mid August. His adherence has been spotty in the past. I think best to continue biktarvy for him at this time to ensure higher barrier to resistance. We discussed labs last visit in April - VL 85 and CD4 > 450. Will repeat VL today Will need to get him back here at release to help with any renewals for ADAP and coordination of his care.  Vaccines updated today - see health maintenance section.   FU in September after he is released recommended.

## 2021-10-09 NOTE — Assessment & Plan Note (Addendum)
Continue lisinopril 20 mg and hctz 12.5 mg  Will look into consolidating to single tablet at release since they are working well at current dose. BP today 136/83 and per jail records SBP typically stays < 130

## 2021-10-09 NOTE — Assessment & Plan Note (Signed)
Menveo booster today Influenza vaccine at next visit in Sept if available then.  Discussed HMPX vaccine - he may be interested once he is released.

## 2021-10-09 NOTE — Addendum Note (Signed)
Addended by: Archer Callas on: 10/09/2021 10:25 AM   Modules accepted: Orders, Level of Service

## 2021-10-12 LAB — HIV-1 RNA QUANT-NO REFLEX-BLD
HIV 1 RNA Quant: 44 Copies/mL — ABNORMAL HIGH
HIV-1 RNA Quant, Log: 1.65 Log cps/mL — ABNORMAL HIGH

## 2021-12-31 ENCOUNTER — Other Ambulatory Visit: Payer: Self-pay | Admitting: Infectious Diseases

## 2021-12-31 DIAGNOSIS — B20 Human immunodeficiency virus [HIV] disease: Secondary | ICD-10-CM

## 2022-05-05 ENCOUNTER — Ambulatory Visit
Admission: EM | Admit: 2022-05-05 | Discharge: 2022-05-05 | Disposition: A | Discharge status: Home / Self Care | Payer: Medicaid Other | Attending: Emergency Medicine | Admitting: Emergency Medicine

## 2022-05-05 DIAGNOSIS — J309 Allergic rhinitis, unspecified: Secondary | ICD-10-CM | POA: Diagnosis not present

## 2022-05-05 DIAGNOSIS — H1013 Acute atopic conjunctivitis, bilateral: Secondary | ICD-10-CM | POA: Diagnosis not present

## 2022-05-05 MED ORDER — CETIRIZINE HCL 10 MG PO TABS: 10.0000 mg | ORAL_TABLET | Freq: Every day | ORAL | 1 refills | Status: DC

## 2022-05-05 MED ORDER — FLUTICASONE PROPIONATE 50 MCG/ACT NA SUSP: 1.0000 | Freq: Every day | NASAL | 2 refills | Status: DC

## 2022-05-05 MED ORDER — OLOPATADINE HCL 0.2 % OP SOLN: 1.0000 [drp] | Freq: Every day | OPHTHALMIC | 1 refills | Status: DC

## 2022-05-05 NOTE — ED Provider Notes (Signed)
[facility]    CSN: [id number] Arrival date & time: 05/05/22  1745    HISTORY   Chief Complaint  Patient presents with   Abscess   Conjunctivitis   Cough   Sore Throat   HPI Larry Lutz is a pleasant, 32 y.o. male who presents to urgent care today. Patient complains of nonproductive, persistent cough, sore throat from coughing and yellow drainage from both of his eyes which is worse in the morning for the past 2 weeks.  Patient states has been taking Robitussin, NyQuil and elderberry syrup.  Patient reports a history of allergies but states he is not currently taking any allergy medications.  Patient also complains of drainage from a lesion on the left side of his rectum which he states is not red, swollen or painful, states the drainage is white and non-malodorous.  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  Patient has been followed by infectious disease for his HIV disease, they have been intended to refer him to general surgery for follow-up of the perirectal lesion.  Patient denies shortness of breath, vision changes, eye pain, eye redness.  Patient endorses postnasal drip, runny nose and pressure in both ears.  The history is provided by the patient.   Past Medical History:  Diagnosis Date   Anxiety    Asthma    Colitis    Colon polyp    Crohn disease (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 Authorizing Provider  BIKTARVY 50-200-25 MG TABS tablet TAKE 1 TABLET BY MOUTH DAILY 12/31/21   [place] Callas, NP  hydrochlorothiazide (MICROZIDE) 12.5 MG capsule Take 12.5 mg by mouth daily.    [provider]  lisinopril (ZESTRIL) 20 MG tablet Take 20 mg by mouth daily.    [provider]  mesalamine (LIALDA) 1.2 g EC tablet Take 4.8 g by mouth daily with breakfast.    [provider]  mirtazapine (REMERON) 30 MG tablet Take 30 mg by mouth at bedtime.    [provider]  sertraline (ZOLOFT) 100 MG tablet Take 100 mg by mouth daily.    [provider]    Family History Family History  Problem Relation Age of Onset   Ulcerative colitis Mother    Irritable bowel syndrome Mother    Prostate cancer Paternal Grandfather    Diabetes Paternal Aunt    Colon cancer Neg Hx    Esophageal cancer Neg Hx    Rectal cancer Neg Hx  Stomach cancer Neg Hx    Social History Social History   Tobacco Use   Smoking status: Every Day   Smokeless tobacco: Never  Vaping Use   Vaping Use: Never used  Substance Use Topics   Alcohol use: No   Drug use: Yes    Types: Marijuana   Allergies   Morphine and related  Review of Systems Review of Systems Pertinent findings revealed after performing a 14 point review of systems has been noted in the history of present illness.  Physical Exam Vital Signs BP (!) 138/93 (BP Location: Right Arm)   Pulse (!) 111   Temp 98.5 F (36.9 C) (Oral)   Resp 16   SpO2 98%   No data found.  Physical Exam Vitals and nursing note reviewed.  Constitutional:      General: He is not in  acute distress.    Appearance: Normal appearance. He is not ill-appearing.  HENT:     Head: Normocephalic and atraumatic.     Salivary Glands: Right salivary gland is not diffusely enlarged or tender. Left salivary gland is not diffusely enlarged or tender.     Right Ear: Ear canal and external ear normal. No drainage. A middle ear effusion is present. There is no impacted cerumen. Tympanic membrane is bulging. Tympanic membrane is not injected or erythematous.     Left Ear: Ear canal and external ear normal. No drainage. A middle ear effusion is present. There is no impacted cerumen. Tympanic membrane is bulging. Tympanic membrane is not injected or erythematous.     Ears:     Comments: Bilateral EACs normal, both TMs bulging with clear fluid    Nose: Rhinorrhea present. No nasal deformity, septal deviation, signs of injury, nasal tenderness, mucosal edema or congestion. Rhinorrhea is clear.     Right Nostril: Occlusion present. No foreign body, epistaxis or septal hematoma.     Left Nostril: Occlusion present. No foreign body, epistaxis or septal hematoma.     Right Turbinates: Enlarged, swollen and pale.     Left Turbinates: Enlarged, swollen and pale.     Right Sinus: No maxillary sinus tenderness or frontal sinus tenderness.     Left Sinus: No maxillary sinus tenderness or frontal sinus tenderness.     Mouth/Throat:     Lips: Pink. No lesions.     Mouth: Mucous membranes are moist. No oral lesions.     Pharynx: Oropharynx is clear. Uvula midline. No posterior oropharyngeal erythema or uvula swelling.     Tonsils: No tonsillar exudate. 0 on the right. 0 on the left.     Comments: Postnasal drip Eyes:     General: Lids are normal.        Right eye: No discharge.        Left eye: No discharge.     Extraocular Movements: Extraocular movements intact.     Conjunctiva/sclera: Conjunctivae normal.     Right eye: Right conjunctiva is not injected.     Left eye: Left conjunctiva is not  injected.  Neck:     Trachea: Trachea and phonation normal.  Cardiovascular:     Rate and Rhythm: Normal rate and regular rhythm.     Pulses: Normal pulses.     Heart sounds: Normal heart sounds. No murmur heard.    No friction rub. No gallop.  Pulmonary:     Effort: Pulmonary effort is normal. No accessory muscle usage, prolonged expiration or respiratory distress.     Breath sounds: Normal  breath sounds. No stridor, decreased air movement or transmitted upper airway sounds. No decreased breath sounds, wheezing, rhonchi or rales.  Chest:     Chest wall: No tenderness.  Genitourinary:    Comments: Rectal exam deferred due to chronicity of lesion. Musculoskeletal:        General: Normal range of motion.     Cervical back: Normal range of motion and neck supple. Normal range of motion.  Lymphadenopathy:     Cervical: No cervical adenopathy.  Skin:    General: Skin is warm and dry.     Findings: No erythema or rash.  Neurological:     General: No focal deficit present.     Mental Status: He is alert and oriented to person, place, and time.  Psychiatric:        Mood and Affect: Mood normal.        Behavior: Behavior normal.     Visual Acuity Right Eye Distance:   Left Eye Distance:   Bilateral Distance:    Right Eye Near:   Left Eye Near:    Bilateral Near:     UC Couse / Diagnostics / Procedures:     Radiology No results found.  Procedures Procedures (including critical care time) EKG  Pending results:  Labs Reviewed - No data to display  Medications Ordered in UC: Medications - No data to display  UC Diagnoses / Final Clinical Impressions(s)   I have reviewed the triage vital signs and the nursing notes.  Pertinent labs & imaging results that were available during my care of the patient were reviewed by me and considered in my medical decision making (see chart for details).    Final diagnoses:  Allergic rhinitis, unspecified seasonality, unspecified  trigger  Allergic conjunctivitis of both eyes   Patient advised to follow-up with ID regarding his referral to general surgery.  Patient provided with allergy medications for his respiratory symptoms.  Return precautions advised. Please see discharge instructions below for further details of plan of care as provided to patient. ED Prescriptions     Medication Sig Dispense Auth. Provider   fluticasone (FLONASE) 50 MCG/ACT nasal spray Place 1 spray into both nostrils daily. Begin by using 2 sprays in each nare daily for 3 to 5 days, then decrease to 1 spray in each nare daily. 15.8 mL Lynden Oxford Scales, PA-C   cetirizine (ZYRTEC ALLERGY) 10 MG tablet Take 1 tablet (10 mg total) by mouth at bedtime. 90 tablet Lynden Oxford Scales, PA-C   Olopatadine HCl (PATADAY) 0.2 % SOLN Apply 1 drop to eye daily. 2.5 mL Lynden Oxford Scales, PA-C      PDMP not reviewed this encounter.  Disposition Upon Discharge:  Condition: stable for discharge home Home: take medications as prescribed; routine discharge instructions as discussed; follow up as advised.  Patient presented with an acute illness with associated systemic symptoms and significant discomfort requiring urgent management. In my opinion, this is a condition that a prudent lay person (someone who possesses an average knowledge of health and medicine) may potentially expect to result in complications if not addressed urgently such as respiratory distress, impairment of bodily function or dysfunction of bodily organs.   Routine symptom specific, illness specific and/or disease specific instructions were discussed with the patient and/or caregiver at length.   As such, the patient has been evaluated and assessed, work-up was performed and treatment was provided in alignment with urgent care protocols and evidence based medicine.  Patient/parent/caregiver has been advised that  the patient may require follow up for further testing and treatment  if the symptoms continue in spite of treatment, as clinically indicated and appropriate.  If the patient was tested for COVID-19, Influenza and/or RSV, then the patient/parent/guardian was advised to isolate at home pending the results of his/her diagnostic coronavirus test and potentially longer if they're positive. I have also advised pt that if his/her COVID-19 test returns positive, it's recommended to self-isolate for at least 10 days after symptoms first appeared AND until fever-free for 24 hours without fever reducer AND other symptoms have improved or resolved. Discussed self-isolation recommendations as well as instructions for household member/close contacts as per the [facility] and [place] [facility], and also gave patient the [place] packet with this information.  Patient/parent/caregiver has been advised to return to the [facility] or PCP in 3-5 days if no better; to PCP or the Emergency Department if new signs and symptoms develop, or if the current signs or symptoms continue to change or worsen for further workup, evaluation and treatment as clinically indicated and appropriate  The patient will follow up with their current PCP if and as advised. If the patient does not currently have a PCP we will assist them in obtaining one.   The patient may need specialty follow up if the symptoms continue, in spite of conservative treatment and management, for further workup, evaluation, consultation and treatment as clinically indicated and appropriate.  Patient/parent/caregiver verbalized understanding and agreement of plan as discussed.  All questions were addressed during visit.  Please see discharge instructions below for further details of plan.  Discharge Instructions:   Discharge Instructions      Please reach out to Ms. Dixon at infectious disease to discuss the progress of your referral to general surgery to address her chronically draining rectal lesion.  For your cough, please begin Zyrtec, fluticasone and  Pataday daily.  This will resolve the postnasal drip and drainage from your eyes as well.  Thank you for visiting urgent care today.        This office note has been dictated using Museum/gallery curator.  Unfortunately, this method of dictation can sometimes lead to typographical or grammatical errors.  I apologize for your inconvenience in advance if this occurs.  Please do not hesitate to reach out to me if clarification is needed.      Lynden Oxford Scales, PA-C 05/05/22 [phone]

## 2022-05-05 NOTE — ED Triage Notes (Signed)
Pt c/o abscess near rectum.  C/o yellow drainage to both eyes, cough, sore throat x 2 weeks.   Home interventions: robitussin, nyquil, elderberry

## 2022-05-05 NOTE — Discharge Instructions (Signed)
Please reach out to Ms. Dixon at infectious disease to discuss the progress of your referral to general surgery to address her chronically draining rectal lesion.  For your cough, please begin Zyrtec, fluticasone and Pataday daily.  This will resolve the postnasal drip and drainage from your eyes as well.  Thank you for visiting urgent care today.

## 2022-06-07 ENCOUNTER — Ambulatory Visit: Payer: Medicaid Other | Admitting: Family

## 2022-07-03 ENCOUNTER — Ambulatory Visit
Admission: EM | Admit: 2022-07-03 | Discharge: 2022-07-03 | Disposition: A | Discharge status: Home / Self Care | Payer: Medicaid Other | Attending: Internal Medicine | Admitting: Internal Medicine

## 2022-07-03 DIAGNOSIS — R300 Dysuria: Secondary | ICD-10-CM | POA: Diagnosis present

## 2022-07-03 DIAGNOSIS — K603 Anal fistula, unspecified: Secondary | ICD-10-CM

## 2022-07-03 DIAGNOSIS — Z7251 High risk heterosexual behavior: Secondary | ICD-10-CM

## 2022-07-03 DIAGNOSIS — R369 Urethral discharge, unspecified: Secondary | ICD-10-CM | POA: Diagnosis present

## 2022-07-03 MED ORDER — CEFTRIAXONE SODIUM 1 G IJ SOLR
1.0000 g | Freq: Once | INTRAMUSCULAR | Status: AC
  Administered 2022-07-03: 1 g via INTRAMUSCULAR

## 2022-07-03 MED ORDER — DOXYCYCLINE HYCLATE 100 MG PO CAPS: 100.0000 mg | ORAL_CAPSULE | Freq: Two times a day (BID) | ORAL | 0 refills | Status: AC

## 2022-07-03 NOTE — ED Notes (Signed)
In with Ashlee Hermanns, PA for chaperone. 

## 2022-07-03 NOTE — Discharge Instructions (Addendum)
Your swab was sent to the lab for further testing.  You will be called with results. Doxycycline is an antibiotic given to treat Chlamydia and skin infections. Take the prescription as directed.  You should avoid all sexual activity until you have been notified of all your results and have undergone any necessary treatment.  If you are positive, it is recommended that you inform all sexual partners so they can treat be treated as well before having sex again.   You were given an injection (Rocephin) for your discomfort today. This will help with the pain and infection from an STD.

## 2022-07-03 NOTE — ED Provider Notes (Signed)
[facility]  Note:  This document was prepared using Dragon voice recognition software and may include unintentional dictation errors.  MRN: [id number] DOB: 04/16/1990 DATE: 07/03/22   Subjective:  Chief Complaint:  Chief Complaint  Patient presents with   SEXUALLY TRANSMITTED DISEASE     HPI: Larry Lutz is a 32 y.o. male presenting for penile discharge and dysuria for the past 1 to 2 days.  He reports a new sexual partner.  He noticed he was having some burning in his penis as well as a purulent discharge.  He does have extensive history of STDs including syphilis, HIV, and one unknown STD that he cannot remember.  Reports some pain up into his testicles as well. Denies fever, nausea/vomiting, abdominal pain. Endorses dysuria, penile discharge, penile irritation.  In addition to the penile discharge and pain, he states he is also having some discomfort rectally.  He was recently seen in the ER and diagnosed with a fistula secondary to abscess.  He states he has pain with wiping and sitting in the area as well as he is able to express a brown discharge from the lesion.  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  Presents NAD.  Prior to Admission medications   Medication Sig Start Date End Date Taking? Authorizing Provider  predniSONE (DELTASONE) 20 MG tablet Take 40 mg by mouth daily. 06/27/22  Yes [provider]  BIKTARVY 50-200-25 MG TABS tablet TAKE 1 TABLET BY MOUTH DAILY 12/31/21   Blanchard Kelch, NP  cetirizine (ZYRTEC ALLERGY) 10 MG tablet Take 1 tablet (10 mg total) by mouth at bedtime. 05/05/22 11/01/22  Theadora Rama Scales, PA-C  fluticasone (FLONASE) 50 MCG/ACT nasal spray Place 1 spray into both nostrils daily. Begin by using 2 sprays in each nare daily for 3 to 5 days, then decrease to 1 spray in each nare daily. 05/05/22   Theadora Rama Scales, PA-C  hydrochlorothiazide (MICROZIDE) 12.5 MG capsule Take 12.5 mg by mouth daily.     [provider]  lisinopril (ZESTRIL) 20 MG tablet Take 20 mg by mouth daily.    [provider]  mesalamine (LIALDA) 1.2 g EC tablet Take 4.8 g by mouth daily with breakfast.    [provider]  mirtazapine (REMERON) 30 MG tablet Take 30 mg by mouth at bedtime.    [provider]  Olopatadine HCl (PATADAY) 0.2 % SOLN Apply 1 drop to eye daily. 05/05/22   Theadora Rama Scales, PA-C  sertraline (ZOLOFT) 100 MG tablet Take 100 mg by mouth daily.    [provider]     Allergies  Allergen Reactions   Morphine And Related     Caused him to itch    History:   Past Medical History:  Diagnosis Date   Anxiety    Asthma    Colitis    Colon polyp    Crohn disease    Depressed    GI bleed    HIV (human immunodeficiency virus infection)    Hypertension    IBS (irritable bowel syndrome)    UC (ulcerative colitis)      Past Surgical History:  Procedure Laterality Date   BRAIN SURGERY     COLONOSCOPY     in [place] around 2017 or 2018     Family History  Problem Relation Age of Onset   Ulcerative colitis Mother    Irritable bowel syndrome Mother    Prostate cancer Paternal Grandfather  Diabetes Paternal Aunt    Colon cancer Neg Hx    Esophageal cancer Neg Hx    Rectal cancer Neg Hx    Stomach cancer Neg Hx     Social History   Tobacco Use   Smoking status: Every Day   Smokeless tobacco: Never  Vaping Use   Vaping Use: Never used  Substance Use Topics   Alcohol use: No   Drug use: Yes    Types: Marijuana    Review of Systems  Constitutional:  Negative for fever.  Gastrointestinal:  Positive for rectal pain. Negative for abdominal pain, nausea and vomiting.  Genitourinary:  Positive for dysuria, penile discharge, penile pain and testicular pain. Negative for flank pain and genital sores.  Musculoskeletal:  Negative for back pain.     Objective:   Vitals: BP 138/86 (BP Location: Right Arm)   Pulse 78   Temp  98.7 F (37.1 C) (Oral)   Resp 18   SpO2 98%   Physical Exam Exam conducted with a chaperone present.  Constitutional:      General: He is not in acute distress.    Appearance: Normal appearance. He is well-developed and normal weight. He is not ill-appearing or toxic-appearing.  HENT:     Head: Normocephalic and atraumatic.  Cardiovascular:     Rate and Rhythm: Normal rate and regular rhythm.     Heart sounds: Normal heart sounds.  Pulmonary:     Effort: Pulmonary effort is normal.     Breath sounds: Normal breath sounds.     Comments: Clear to auscultation bilaterally  Abdominal:     General: Bowel sounds are normal.     Palpations: Abdomen is soft.     Tenderness: There is no abdominal tenderness.     Hernia: No hernia is present.  Genitourinary:    Penis: Circumcised. Tenderness present.      Testes: Normal.     Epididymis:     Left: Tenderness present.     Rectum: Tenderness present.     Comments: Perianal fistula noted on left inner buttocks. Skin:    General: Skin is warm and dry.  Neurological:     General: No focal deficit present.     Mental Status: He is alert.  Psychiatric:        Mood and Affect: Mood and affect normal.     Results:  Labs: No results found for this or any previous visit (from the past 24 hour(s)).  Radiology: No results found.   UC Course/Treatments:  Procedures: Procedures   Medications Ordered in UC: Medications  cefTRIAXone (ROCEPHIN) injection 1 g (has no administration in time range)     Assessment and Plan :     ICD-10-CM   1. Penile discharge  R36.9     2. High risk sexual behavior, unspecified type  Z72.51 Hsv Culture And Typing    Hsv Culture And Typing    3. 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 VSS. DDX includes but not limited to: Chlamydia, gonorrhea, candidiasis, trichomoniasis Cytology is pending.  Doxycycline 100 mg twice daily was  prescribed to empirically treat Chlamydia.  Rocephin 1 g given IM today in office to empirically treat gonorrhea.  Safe sex precautions advised.  Patient refused RPR testing at this time.  Strict ED precautions were given and patient verbalized understanding.  High risk sexual behavior behavior: Afebrile, nontoxic-appearing, NAD. VSS. DDX includes  but not limited to: Chlamydia, gonorrhea, trichomoniasis, HSV Cytology is pending.  RPR was refused by the patient today in office.  Anal lesion appears consistent with anal fissure, but HSV swab was collected today.  Safe sex precautions advised.  Strict ED precautions were given and patient verbalized understanding.  Dysuria: Afebrile, nontoxic-appearing, NAD. VSS. DDX includes but not limited to: STD, pyelonephritis, cystitis Patient was unable to give a urine sample today in office, symptoms consistent with an STD.  Cytology is pending.  Patient was empirically treated with Doxycycline and Rocephin.  Safe sex precautions advised.  Strict ED precautions were given and patient verbalized understanding.  Perianal fistula: Afebrile, nontoxic-appearing, NAD. VSS. Previously diagnosed in the ER on 06/26/2022 DDX includes but not limited to: Perianal fistula Exam is consistent with perianal fistula, but HSV swab was obtained.  Patient was instructed to follow-up with surgical team for repair.  Doxycycline 100 mg twice daily as well as Rocephin 1 g was given today in office which will help empirically treat for any developing abscess.  Strict ED precautions were given and patient verbalized understanding.  ED Discharge Orders          Ordered    doxycycline (VIBRAMYCIN) 100 MG capsule  2 times daily        07/03/22 1552             PDMP not reviewed this encounter.     Geena Weinhold P, PA-C 07/03/22 1606

## 2022-07-03 NOTE — ED Triage Notes (Signed)
Pt c/o pain in penis and rectum, pt reports having to push under testicles to relieve the pain at times. Pt has hx of abscess turned into fistula in this area and when he pushes on the area 'stool comes out' and at times it is yellow. Has concern that the pain in end of penis started after receiving oral sex from new partner. Pt has noticed some penile discharge   No abd pain right now.  Hx of 'UC or Chron's, HIV' and 'Syphilis or another STD, unsure.'

## 2022-07-05 ENCOUNTER — Ambulatory Visit
Admission: EM | Admit: 2022-07-05 | Discharge: 2022-07-05 | Disposition: A | Discharge status: Home / Self Care | Payer: Medicaid Other

## 2022-07-05 DIAGNOSIS — S6991XA Unspecified injury of right wrist, hand and finger(s), initial encounter: Secondary | ICD-10-CM

## 2022-07-05 LAB — CYTOLOGY, (ORAL, ANAL, URETHRAL) ANCILLARY ONLY
Chlamydia: POSITIVE — AB
Comment: NEGATIVE
Comment: NEGATIVE
Comment: NORMAL
Neisseria Gonorrhea: NEGATIVE
Trichomonas: NEGATIVE

## 2022-07-05 NOTE — ED Triage Notes (Signed)
Pt c/o right hand laceration this am while cleaning.

## 2022-07-05 NOTE — ED Notes (Signed)
Patient is being discharged from the Urgent Care and sent to the Emergency Department via POV . Per Theadora Rama Scales APP, patient is in need of higher level of care due to condition. Patient is aware and verbalizes understanding of plan of care.  Vitals:   07/05/22 1237  BP: (!) 146/91  Pulse: (!) 107  Resp: 17  Temp: 98.7 F (37.1 C)  SpO2: 98%

## 2022-07-05 NOTE — ED Provider Notes (Signed)
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  Patient is unable to flex or extend his carpometacarpal joint at this time.  Patient advised to go to the emergency room for further evaluation and treatment.   Theadora Rama Scales, PA-C 07/05/22 1301

## 2022-07-06 LAB — HSV CULTURE AND TYPING

## 2022-07-12 ENCOUNTER — Other Ambulatory Visit (HOSPITAL_COMMUNITY): Payer: Self-pay

## 2022-07-12 ENCOUNTER — Other Ambulatory Visit: Payer: Self-pay

## 2022-07-12 ENCOUNTER — Encounter: Payer: Self-pay | Admitting: Infectious Diseases

## 2022-07-12 ENCOUNTER — Ambulatory Visit (INDEPENDENT_AMBULATORY_CARE_PROVIDER_SITE_OTHER): Payer: Medicaid Other | Admitting: Infectious Diseases

## 2022-07-12 VITALS — BP 125/82 | HR 109 | Temp 98.6°F | Ht 67.0 in | Wt 143.0 lb

## 2022-07-12 DIAGNOSIS — B20 Human immunodeficiency virus [HIV] disease: Secondary | ICD-10-CM

## 2022-07-12 DIAGNOSIS — A0839 Other viral enteritis: Secondary | ICD-10-CM

## 2022-07-12 DIAGNOSIS — K604 Rectal fistula: Secondary | ICD-10-CM

## 2022-07-12 DIAGNOSIS — K1379 Other lesions of oral mucosa: Secondary | ICD-10-CM | POA: Insufficient documentation

## 2022-07-12 DIAGNOSIS — K51811 Other ulcerative colitis with rectal bleeding: Secondary | ICD-10-CM | POA: Diagnosis not present

## 2022-07-12 DIAGNOSIS — K626 Ulcer of anus and rectum: Secondary | ICD-10-CM | POA: Insufficient documentation

## 2022-07-12 DIAGNOSIS — Z202 Contact with and (suspected) exposure to infections with a predominantly sexual mode of transmission: Secondary | ICD-10-CM | POA: Diagnosis not present

## 2022-07-12 DIAGNOSIS — B370 Candidal stomatitis: Secondary | ICD-10-CM

## 2022-07-12 DIAGNOSIS — D649 Anemia, unspecified: Secondary | ICD-10-CM

## 2022-07-12 MED ORDER — PENICILLIN G BENZATHINE 1200000 UNIT/2ML IM SUSY
1.2000 10*6.[IU] | PREFILLED_SYRINGE | Freq: Once | INTRAMUSCULAR | Status: AC
Start: 2022-07-12 — End: 2022-07-12
  Administered 2022-07-12: 1.2 10*6.[IU] via INTRAMUSCULAR

## 2022-07-12 MED ORDER — NYSTATIN 100000 UNIT/ML MT SUSP
5.0000 mL | Freq: Four times a day (QID) | OROMUCOSAL | 0 refills | Status: DC
Start: 2022-07-12 — End: 2022-07-12
  Filled 2022-07-12: qty 60, 3d supply, fill #0

## 2022-07-12 MED ORDER — NYSTATIN 100000 UNIT/ML MT SUSP
5.0000 mL | Freq: Four times a day (QID) | OROMUCOSAL | 0 refills | Status: DC
Start: 2022-07-12 — End: 2022-07-15

## 2022-07-12 MED ORDER — BICTEGRAVIR-EMTRICITAB-TENOFOV 50-200-25 MG PO TABS
1.0000 | ORAL_TABLET | Freq: Every day | ORAL | 5 refills | Status: DC
Start: 2022-07-12 — End: 2023-03-24
  Filled 2022-07-12 – 2022-07-14 (×2): qty 30, 30d supply, fill #0
  Filled 2022-08-05: qty 30, 30d supply, fill #1

## 2022-07-12 MED ORDER — LIDOCAINE 5 % EX OINT: 1.0000 | TOPICAL_OINTMENT | CUTANEOUS | 0 refills | Status: DC | PRN

## 2022-07-12 MED ORDER — AMOXICILLIN-POT CLAVULANATE 875-125 MG PO TABS
1.0000 | ORAL_TABLET | Freq: Two times a day (BID) | ORAL | 0 refills | Status: DC
Start: 2022-07-12 — End: 2022-07-15

## 2022-07-12 MED ORDER — AMOXICILLIN-POT CLAVULANATE 875-125 MG PO TABS
1.0000 | ORAL_TABLET | Freq: Two times a day (BID) | ORAL | 0 refills | Status: DC
Start: 2022-07-12 — End: 2022-07-12

## 2022-07-12 NOTE — Assessment & Plan Note (Addendum)
Largely only diffuse erythema noted on tongue, small circular patches of white on the tongue and palate. Will start treatment with oral nystatin swishes to see if this helps. With recent antibiotic use could be oral thrush. Last CD4 in July 440 - will follow repeat today  ?atypical esophagitis or glossitis from recent course of doxycycline is a possibility.

## 2022-07-12 NOTE — Patient Instructions (Addendum)
You can add some Aquaphor to your gauze to help it come off and on with less pain. This is over the counter and very gentle.   Will send in your [place] prescription to [facility] - they can mail to you and you can communicate with them over MyChart and avoid long phone calls / wait times. They will need to call you first to verify shipping information so when you see a [phone] that's them   I want you to start taking an over the counter multivitamin - your hemoglobin and potassium are both low. Try to separate this from your Biktarvy by 6 hours so they are not in the stomach at the same time.   Will start an antibiotic called Augmentin twice a day - I sent this to your local pharmacy to pick up today so you can start that soon.   Please stop by the lab on your way out.   I would like to see you back in 3-4 weeks.

## 2022-07-12 NOTE — Assessment & Plan Note (Signed)
Referral to surgery placed. He will likely need exam under anesthesia.

## 2022-07-12 NOTE — Assessment & Plan Note (Addendum)
Two full thickness ulcerations that are indurated. Tough to examine him today d/t pain. HSV culture recently collected negative and has been there long enough antiviral won't be super helpful here. There is stool contaminating the larger ulcer on the patient's left glue. I cleansed the area and applied antibiotic ointment.  Could represent acute syphilis infection esp with reported body rash in the setting of penile skin changes too. Will collect RPR today and treat presumptively with bicillin. 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  Alternatively, he has h/o CMV colitis - will follow for colonoscopy biopsies (scheduled tomorrow) to see if restarting tx with valcyte is necessary.  Start lidocaine gel PRN for pain as well   FU in 3-4 weeks.

## 2022-07-12 NOTE — Progress Notes (Signed)
Patient Name: Larry Lutz  DOB: 06-12-1990  MRN: [id number]     SUBJECTIVE: Brief Narrative:  Larry Lutz is a 32 y.o. AA male with HIV infection. Originally diagnosed with HIV in 2017 in [place] (no records available). Inconsistent with medication adherence. HIV Risk: MSM History of OIs: none   Previous Regimen:  Biktarvy >> suppressed   Genotype:  wild type 12/2016   CC: HIV follow up care    HPI:  Larry Lutz is here for follow up of HIV and a few acute problems.   Reviewed UC visits recently - Treated recently for acute chlamydia infection during on 4/20 - received. He says the urethral discharge and skin changes on his penis have improved but he noticed a rash over his body and new changes on his tongue with white patches and mouth pain.   Having problems with drainage out of his fistula around anus - Reports it is stool drainage. It is so painful removing the gauze pad he uses to collect the drainage. The ulceration has been a more recent development. There are two spots on either side of anus that are very painful.   Years ago had problems with someone giving him silicone injections. Not sure if this is causing problem.  Has had fistula for about 2 years. He has purulent drainage at times.    Review of Systems  All other systems reviewed and are negative.    Past Medical History:  Diagnosis Date   Anxiety    Asthma    Colitis    Colon polyp    Crohn disease (HCC)    Depressed    GI bleed    HIV (human immunodeficiency virus infection) (HCC)    Hypertension    IBS (irritable bowel syndrome)    UC (ulcerative colitis) (HCC)    No Known Allergies    Outpatient Medications Prior to Visit  Medication Sig Dispense Refill   predniSONE (DELTASONE) 20 MG tablet Take 40 mg by mouth daily.     cetirizine (ZYRTEC ALLERGY) 10 MG tablet Take 1 tablet (10 mg total) by mouth at bedtime. (Patient not taking: Reported on 07/12/2022) 90 tablet 1   fluticasone (FLONASE) 50 MCG/ACT  nasal spray Place 1 spray into both nostrils daily. Begin by using 2 sprays in each nare daily for 3 to 5 days, then decrease to 1 spray in each nare daily. (Patient not taking: Reported on 07/12/2022) 15.8 mL 2   hydrochlorothiazide (MICROZIDE) 12.5 MG capsule Take 12.5 mg by mouth daily. (Patient not taking: Reported on 07/12/2022)     lisinopril (ZESTRIL) 20 MG tablet Take 20 mg by mouth daily. (Patient not taking: Reported on 07/12/2022)     mesalamine (LIALDA) 1.2 g EC tablet Take 4.8 g by mouth daily with breakfast. (Patient not taking: Reported on 07/12/2022)     mirtazapine (REMERON) 30 MG tablet Take 30 mg by mouth at bedtime. (Patient not taking: Reported on 07/12/2022)     Olopatadine HCl (PATADAY) 0.2 % SOLN Apply 1 drop to eye daily. (Patient not taking: Reported on 07/12/2022) 2.5 mL 1   sertraline (ZOLOFT) 100 MG tablet Take 100 mg by mouth daily. (Patient not taking: Reported on 07/12/2022)     BIKTARVY 50-200-25 MG TABS tablet TAKE 1 TABLET BY MOUTH DAILY (Patient not taking: Reported on 07/12/2022) 30 tablet 0   No facility-administered medications prior to visit.    Social History   Tobacco Use   Smoking status: Every Day   Smokeless tobacco:  Never  Vaping Use   Vaping Use: Never used  Substance Use Topics   Alcohol use: No   Drug use: Yes    Types: Marijuana, Methamphetamines    Physical Exam and Objective Findings:  Vitals:   07/12/22 1012  BP: 125/82  Pulse: (!) 109  Temp: 98.6 F (37 C)  TempSrc: Oral  SpO2: 97%  Weight: 143 lb (64.9 kg)  Height: 5\' 7"  (1.702 m)   Body mass index is 22.4 kg/m. **Patient refused vital signs.    Physical Exam Constitutional:      Appearance: Normal appearance. He is not ill-appearing.  HENT:     Head: Normocephalic.     Mouth/Throat:     Mouth: Mucous membranes are moist.     Pharynx: Oropharynx is clear. Posterior oropharyngeal erythema present.     Comments: Several white circular patches noted on tongue and on soft  palate with erythematous base.  Eyes:     General: No scleral icterus. Cardiovascular:     Rate and Rhythm: Normal rate.  Pulmonary:     Effort: Pulmonary effort is normal.  Genitourinary:    Rectum: Tenderness present.       Comments: Two areas of full thickness skin loss in the setting of ulceration. The drainage is largely brown and thin on the gauze. He does have stool sitting in wound bed.  Musculoskeletal:        General: Normal range of motion.     Cervical back: Normal range of motion.  Skin:    Coloration: Skin is not jaundiced or pale.  Neurological:     Mental Status: He is alert and oriented to person, place, and time.  Psychiatric:        Mood and Affect: Mood normal.        Judgment: Judgment normal.     Lab Results Lab Results  Component Value Date   WBC 9.4 06/15/2021   HGB 10.5 (L) 06/15/2021   HCT 34.8 (L) 06/15/2021   MCV 66.4 (L) 06/15/2021   PLT 525 (H) 06/15/2021    Lab Results  Component Value Date   CREATININE 1.02 06/15/2021   BUN 11 06/15/2021   NA 138 06/15/2021   K 4.6 06/15/2021   CL 101 06/15/2021   CO2 27 06/15/2021    Lab Results  Component Value Date   ALT 12 06/15/2021   AST 16 06/15/2021   ALKPHOS 71 05/04/2018   BILITOT 0.4 06/15/2021    Lab Results  Component Value Date   CHOL 236 (H) 01/05/2017   HDL 45 01/05/2017   LDLCALC 163 (H) 01/05/2017   TRIG 138 01/05/2017   CHOLHDL 5.2 (H) 01/05/2017   HIV 1 RNA Quant  Date Value  10/09/2021 44 Copies/mL (H)  06/15/2021 84 copies/mL (H)  08/15/2018 176 copies/mL (H)   CD4 T Cell Abs (/uL)  Date Value  07/12/2022 <35 (L)  06/15/2021 440  08/15/2018 701    ASSESSMENT & PLAN:    Problem List Items Addressed This Visit       Unprioritized   Ulcerative colitis (HCC)   Anemia    In the setting of chronic blood loss with history of ulcerative colitis/IBD.  Iron panel today.  I asked him to start a MVI over the counter separated form Biktarvy for now.        Relevant Orders   Iron, TIBC and Ferritin Panel (Completed)   Human immunodeficiency virus (HIV) disease (HCC) - Primary (Chronic)    Has  been off medications since release in October. Last CD4 was 440 in July.  Will resume Biktarvy and check genosure.  RTC in 3-4 weeks for review of pending studies and repeat VL testing.       Relevant Medications   bictegravir-emtricitabine-tenofovir AF (BIKTARVY) 50-200-25 MG TABS tablet   valGANciclovir (VALCYTE) 450 MG tablet   nystatin (MYCOSTATIN) 100000 UNIT/ML suspension   sulfamethoxazole-trimethoprim (BACTRIM DS) 800-160 MG tablet (Start on 07/16/2022)   Other Relevant Orders   RPR (Completed)   HIV-1 Genotyping (RTI,PI,IN Inhbtr)   T-helper cells (CD4) count (Completed)   Possible exposure to STD   History of CMV colitis    Follow up colonoscopy from [facility] team. Could be all UC flare up.       Relevant Medications   bictegravir-emtricitabine-tenofovir AF (BIKTARVY) 50-200-25 MG TABS tablet   valGANciclovir (VALCYTE) 450 MG tablet   nystatin (MYCOSTATIN) 100000 UNIT/ML suspension   sulfamethoxazole-trimethoprim (BACTRIM DS) 800-160 MG tablet (Start on 07/16/2022)   Perirectal fistula, suspected     Referral to surgery placed. He will likely need exam under anesthesia.       Relevant Orders   Ambulatory referral to General Surgery   Acute pain of mouth    Largely only diffuse erythema noted on tongue, small circular patches of white on the tongue and palate. Will start treatment with oral nystatin swishes to see if this helps. With recent antibiotic use could be oral thrush. Last CD4 in July 440 - will follow repeat today  ?atypical esophagitis or glossitis from recent course of doxycycline is a possibility.      Relevant Medications   nystatin (MYCOSTATIN) 100000 UNIT/ML suspension   Anal ulcer    Two full thickness ulcerations that are indurated. Tough to examine him today d/t pain. HSV culture recently collected negative and has  been there long enough antiviral won't be super helpful here. There is stool contaminating the larger ulcer on the patient's left glue. I cleansed the area and applied antibiotic ointment.  Could represent acute syphilis infection esp with reported body rash in the setting of penile skin changes too. Will collect RPR today and treat presumptively with bicillin. 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  Alternatively, he has h/o CMV colitis - will follow for colonoscopy biopsies (scheduled tomorrow) to see if restarting tx with valcyte is necessary.  Start lidocaine gel PRN for pain as well   FU in 3-4 weeks.       Relevant Medications   amoxicillin-clavulanate (AUGMENTIN) 875-125 MG tablet   Other Relevant Orders   RPR (Completed)   Other Visit Diagnoses     AIDS (acquired immune deficiency syndrome) (HCC)       Relevant Medications   bictegravir-emtricitabine-tenofovir AF (BIKTARVY) 50-200-25 MG TABS tablet   valGANciclovir (VALCYTE) 450 MG tablet   nystatin (MYCOSTATIN) 100000 UNIT/ML suspension   sulfamethoxazole-trimethoprim (BACTRIM DS) 800-160 MG tablet (Start on 07/16/2022)      Total Encounter Time 42 minutes including time spent in face to face discussion, physical exam, review of multiple urgent care and specialty provider encounters over the last 2 months, lab and radiographic studies > 5 from outside health system and coordination of referrals personally.   Rexene Alberts, MSN, NP-C [facility] Pager: [phone]

## 2022-07-12 NOTE — Assessment & Plan Note (Signed)
Follow up colonoscopy from [facility] team. Could be all UC flare up.

## 2022-07-12 NOTE — Assessment & Plan Note (Signed)
In the setting of chronic blood loss with history of ulcerative colitis/IBD.  Iron panel today.  I asked him to start a MVI over the counter separated form Biktarvy for now.

## 2022-07-12 NOTE — Assessment & Plan Note (Signed)
Has been off medications since release in October. Last CD4 was 440 in July.  Will resume Biktarvy and check genosure.  RTC in 3-4 weeks for review of pending studies and repeat VL testing.

## 2022-07-13 ENCOUNTER — Other Ambulatory Visit (HOSPITAL_COMMUNITY): Payer: Self-pay

## 2022-07-13 LAB — T-HELPER CELLS (CD4) COUNT (NOT AT ARMC)
CD4 % Helper T Cell: 3 % — ABNORMAL LOW (ref 33–65)
CD4 T Cell Abs: <35 /uL — ABNORMAL LOW (ref 400–1790)

## 2022-07-13 LAB — IRON,TIBC AND FERRITIN PANEL
Ferritin: 106 ng/mL (ref 38–380)
TIBC: 271 mcg/dL (calc) (ref 250–425)

## 2022-07-13 MED ORDER — VALGANCICLOVIR HCL 450 MG PO TABS
900.0000 mg | ORAL_TABLET | Freq: Two times a day (BID) | ORAL | 0 refills | Status: AC
Start: 2022-07-13 — End: 2022-07-20
  Filled 2022-07-13 (×2): qty 28, 7d supply, fill #0
  Filled 2022-07-14: qty 28, 14d supply, fill #0

## 2022-07-13 MED ORDER — SULFAMETHOXAZOLE-TRIMETHOPRIM 800-160 MG PO TABS
1.0000 | ORAL_TABLET | ORAL | 1 refills | Status: DC
Start: 2022-07-14 — End: 2022-07-15
  Filled 2022-07-13 (×2): qty 12, 28d supply, fill #0
  Filled 2022-07-13: qty 30, 70d supply, fill #0

## 2022-07-13 NOTE — Addendum Note (Signed)
Addended by: Blanchard Kelch on: 07/13/2022 02:35 PM   Modules accepted: Orders

## 2022-07-14 ENCOUNTER — Other Ambulatory Visit: Payer: Self-pay

## 2022-07-14 ENCOUNTER — Other Ambulatory Visit (HOSPITAL_COMMUNITY): Payer: Self-pay

## 2022-07-14 ENCOUNTER — Encounter: Payer: Self-pay | Admitting: Pharmacist

## 2022-07-15 ENCOUNTER — Other Ambulatory Visit: Payer: Self-pay

## 2022-07-15 ENCOUNTER — Other Ambulatory Visit (HOSPITAL_COMMUNITY): Payer: Self-pay

## 2022-07-15 MED ORDER — NYSTATIN 100000 UNIT/ML MT SUSP
5.0000 mL | Freq: Four times a day (QID) | OROMUCOSAL | 0 refills | Status: DC
Start: 2022-07-15 — End: 2023-10-15

## 2022-07-15 MED ORDER — SULFAMETHOXAZOLE-TRIMETHOPRIM 800-160 MG PO TABS
1.0000 | ORAL_TABLET | ORAL | 1 refills | Status: DC
Start: 2022-07-16 — End: 2022-10-23

## 2022-07-15 MED ORDER — LIDOCAINE 5 % EX OINT: 1.0000 | TOPICAL_OINTMENT | CUTANEOUS | 0 refills | Status: DC | PRN

## 2022-07-15 MED ORDER — AMOXICILLIN-POT CLAVULANATE 875-125 MG PO TABS
1.0000 | ORAL_TABLET | Freq: Two times a day (BID) | ORAL | 0 refills | Status: DC
Start: 2022-07-15 — End: 2022-10-23

## 2022-07-15 NOTE — Addendum Note (Signed)
Addended by: Blanchard Kelch on: 07/15/2022 12:08 PM   Modules accepted: Orders

## 2022-07-17 ENCOUNTER — Other Ambulatory Visit (HOSPITAL_COMMUNITY): Payer: Self-pay

## 2022-07-19 ENCOUNTER — Encounter: Payer: Self-pay | Admitting: Pharmacist

## 2022-07-19 ENCOUNTER — Other Ambulatory Visit: Payer: Self-pay

## 2022-07-23 ENCOUNTER — Other Ambulatory Visit: Payer: Self-pay

## 2022-07-30 LAB — HIV-1 GENOTYPING (RTI,PI,IN INHBTR): HIV-1 Genotype: DETECTED — AB

## 2022-07-30 LAB — RPR: RPR Ser Ql: NONREACTIVE

## 2022-07-30 LAB — IRON,TIBC AND FERRITIN PANEL
%SAT: 14 % (calc) — ABNORMAL LOW (ref 20–48)
Iron: 38 ug/dL — ABNORMAL LOW (ref 50–180)

## 2022-08-05 ENCOUNTER — Other Ambulatory Visit (HOSPITAL_COMMUNITY): Payer: Self-pay

## 2022-08-10 ENCOUNTER — Ambulatory Visit: Payer: Medicaid Other | Admitting: Infectious Diseases

## 2022-08-10 ENCOUNTER — Other Ambulatory Visit (HOSPITAL_COMMUNITY): Payer: Self-pay

## 2022-09-01 ENCOUNTER — Other Ambulatory Visit (HOSPITAL_COMMUNITY): Payer: Self-pay

## 2022-09-03 ENCOUNTER — Other Ambulatory Visit (HOSPITAL_COMMUNITY): Payer: Self-pay

## 2022-09-06 ENCOUNTER — Encounter (HOSPITAL_COMMUNITY): Payer: Self-pay

## 2022-09-06 ENCOUNTER — Other Ambulatory Visit (HOSPITAL_COMMUNITY): Payer: Self-pay

## 2022-09-13 ENCOUNTER — Encounter: Payer: Self-pay | Admitting: Pharmacy Technician

## 2022-10-23 ENCOUNTER — Ambulatory Visit
Admission: EM | Admit: 2022-10-23 | Discharge: 2022-10-23 | Disposition: A | Discharge status: Home / Self Care | Payer: Medicaid Other | Attending: Internal Medicine | Admitting: Internal Medicine

## 2022-10-23 DIAGNOSIS — L299 Pruritus, unspecified: Secondary | ICD-10-CM | POA: Diagnosis not present

## 2022-10-23 DIAGNOSIS — R21 Rash and other nonspecific skin eruption: Secondary | ICD-10-CM | POA: Diagnosis not present

## 2022-10-23 MED ORDER — METHYLPREDNISOLONE ACETATE 80 MG/ML IJ SUSP
40.0000 mg | Freq: Once | INTRAMUSCULAR | Status: AC
  Administered 2022-10-23: 40 mg via INTRAMUSCULAR

## 2022-10-23 MED ORDER — CETIRIZINE HCL 10 MG PO TABS: 10.0000 mg | ORAL_TABLET | Freq: Every day | ORAL | 0 refills | Status: DC | PRN

## 2022-10-23 MED ORDER — PREDNISONE 20 MG PO TABS: ORAL_TABLET | ORAL | 0 refills | Status: DC

## 2022-10-23 NOTE — Discharge Instructions (Addendum)
Your blood work were sent to the lab for further testing.  You will be called with results. You should avoid all sexual activity until you have been notified of all your results and have undergone any necessary treatment.  If you are positive, it is recommended that you inform all sexual partners so they can treat be treated as well before having sex again.   You were given an injection (Methylprednisolone) for your rash today. This will help with the rash and pruritus.   You have been prescribed Prednisone and Zyrtec for your rash. The first is a steroid. I recommend you start that tomorrow given your injection today in office. The second is an antihistamine used for itching. These are often used to treat rashes. Please take as directed.  If no improvement, it is very important that you follow up with your PCP for further testing and management.

## 2022-10-23 NOTE — ED Triage Notes (Signed)
Pt with rash all over x 3 weeks that is itchy.  Denies: joint pain

## 2022-10-23 NOTE — ED Provider Notes (Signed)
[facility]  Note:  This document was prepared using Dragon voice recognition software and may include unintentional dictation errors.  MRN: [id number] DOB: January 03, 1991 DATE: 10/23/22   Subjective:  Chief Complaint:  Chief Complaint  Patient presents with   Rash     HPI: Larry Lutz is a 32 y.o. male presenting for diffuse rash for the past 3 weeks. Rash appears to be whole body except for face and palms. He reports diffuse pruritus as well. No new medications, lotions, body washes, or detergents. No recent travel. Reports no one with similar symptoms. He has not taken anything for his symptoms, but was previously on Prednisone daily. He reports no new sexual partners. He reports no pain, but some dryness and burning occasionally. Patient has extensive medical history including HIV and perirectal fistula. He is concerned that any number of his medical issues could be causing his symptoms. He is noncompliant with his daily medications often starting and stopping them. Denies fever, nausea/vomiting, myalgias, arthralgias, penile lesions, penile discharge. Endorses rash, pruritus. Presents NAD.  Prior to Admission medications   Medication Sig Start Date End Date Taking? Authorizing Provider  bictegravir-emtricitabine-tenofovir AF (BIKTARVY) 50-200-25 MG TABS tablet Take 1 tablet by mouth daily. Try to take at the same time each day with or without food. 07/12/22   Blanchard Kelch, NP  hydrochlorothiazide (MICROZIDE) 12.5 MG capsule Take 12.5 mg by mouth daily. Patient not taking: Reported on 07/12/2022    [provider]  lidocaine (XYLOCAINE) 5 % ointment Apply 1 Application topically as needed. 07/15/22   Blanchard Kelch, NP  lisinopril (ZESTRIL) 20 MG tablet Take 20 mg by mouth daily. Patient not taking: Reported on 07/12/2022    [provider]  mesalamine (LIALDA) 1.2 g EC tablet Take 4.8 g by mouth daily with breakfast. Patient not taking: Reported on 07/12/2022     [provider]  mirtazapine (REMERON) 30 MG tablet Take 30 mg by mouth at bedtime. Patient not taking: Reported on 07/12/2022    [provider]  nystatin (MYCOSTATIN) 100000 UNIT/ML suspension Take 5 mLs (500,000 Units total) by mouth 4 (four) times daily. 07/15/22   Blanchard Kelch, NP  sertraline (ZOLOFT) 100 MG tablet Take 100 mg by mouth daily. Patient not taking: Reported on 07/12/2022    [provider]     No Known Allergies  History:   Past Medical History:  Diagnosis Date   Anxiety    Asthma    Colitis    Colon polyp    Crohn disease (HCC)    Depressed    GI bleed    HIV (human immunodeficiency virus infection) (HCC)    Hypertension    IBS (irritable bowel syndrome)    UC (ulcerative colitis) (HCC)      Past Surgical History:  Procedure Laterality Date   BRAIN SURGERY     COLONOSCOPY     in [place] around 2017 or 2018     Family History  Problem Relation Age of Onset   Ulcerative colitis Mother    Irritable bowel syndrome Mother    Prostate cancer Paternal Grandfather    Diabetes Paternal Aunt    Colon cancer Neg Hx    Esophageal cancer Neg Hx    Rectal cancer Neg Hx    Stomach cancer Neg Hx     Social History   Tobacco Use   Smoking status: Every Day   Smokeless tobacco: Never  Vaping Use   Vaping status: Never Used  Substance Use Topics   Alcohol use: No   Drug use: Yes    Types: Marijuana, Methamphetamines    Review of Systems  Constitutional:  Negative for fever.  Gastrointestinal:  Negative for nausea and vomiting.  Genitourinary:  Negative for genital sores and penile discharge.  Musculoskeletal:  Negative for arthralgias and myalgias.  Skin:  Positive for rash.     Objective:   Vitals: BP (!) 159/90 (BP Location: Right Arm)   Pulse 85   Temp 98.6 F (37 C) (Oral)   Resp 16   SpO2 97%   Physical Exam Constitutional:      General: He is not in acute distress.    Appearance: Normal appearance.  He is well-developed and normal weight. He is not ill-appearing or toxic-appearing.  HENT:     Head: Normocephalic and atraumatic.  Cardiovascular:     Rate and Rhythm: Normal rate and regular rhythm.     Heart sounds: Normal heart sounds.  Pulmonary:     Effort: Pulmonary effort is normal.     Breath sounds: Normal breath sounds.     Comments: Clear to auscultation bilaterally  Abdominal:     Palpations: Abdomen is soft.     Tenderness: There is no abdominal tenderness.  Skin:    General: Skin is warm and dry.     Findings: Rash present. Rash is macular and papular.     Comments: Patient has diffuse skin colored maculopapular rash. No warmth, erythema, or discharge. Nontender to palpation. Some area of excoriations noted on exam.  Neurological:     General: No focal deficit present.     Mental Status: He is alert.  Psychiatric:        Mood and Affect: Mood and affect normal.     Results:  Labs: No results found for this or any previous visit (from the past 24 hour(s)).  Radiology: No results found.   UC Course/Treatments:  Procedures: Procedures   Medications Ordered in UC: Medications  methylPREDNISolone acetate (DEPO-MEDROL) injection 40 mg (40 mg Intramuscular Given 10/23/22 1141)     Assessment and Plan :     ICD-10-CM   1. Rash and nonspecific skin eruption  R21 RPR    RPR    2. Pruritus  L29.9      Rash and nonspecific skin eruption Afebrile, nontoxic-appearing, NAD. VSS. DDX includes but not limited to: syphilis, contact dermatitis, atopic dermatitis Patient has diffuse nonspecific rash with multiple etiologies. Patient concerned with infection from his perirectal fistula, but rash does not appear to be consistent with a bacterial etiology. Last RPR test was in 06/2022. It was negative at that time; however, I recommend repeat testing given new diffuse rash and history of HIV. RPR pending. Reports no new sexual partners and no genital lesions. Patient  concerned about his HIV medication, but patient has taken it in the past without any issues. He just recently stopped the medication on his own. At this point, will treat as a dermatitis.  Methylprednisolone 40 mg IM was given today in office.  He was also prescribed a prednisone taper as directed and Zyrtec 10 mg daily as needed for pruritus.  If no improvement, I recommend he follow-up with his PCP and/or infectious disease doctor. He will need further testing at that point. I also recommend he follow up with an allergist for allergy testing as well. Strict ED precautions were given and patient verbalized understanding.  Pruritus Afebrile, nontoxic-appearing, NAD. VSS. DDX includes but not limited  to: syphilis, contact dermatitis, atopic dermatitis Patient has diffuse nonspecific rash with multiple etiologies. Last RPR test was in 06/2022. It was negative at that time; however, I recommend repeat testing given new diffuse rash and history of HIV. RPR pending. Reports no new sexual partners and no genital lesions. Patient concerned about his HIV medication, but patient has taken it in the past without any issues. He just recently stopped the medication on his own. At this point, will treat as a dermatitis.  Methylprednisolone 40 mg IM was given today in office.  He was also prescribed a prednisone taper as directed and Zyrtec 10 mg daily as needed for pruritus.  If no improvement, I recommend he follow-up with his PCP and/or infectious disease doctor. He will need further testing at that point. I also recommend he follow up with an allergist for allergy testing as well. Strict ED precautions were given and patient verbalized understanding.  ED Discharge Orders          Ordered    cetirizine (ZYRTEC) 10 MG tablet  Daily PRN        10/23/22 1135    predniSONE (DELTASONE) 20 MG tablet        10/23/22 1135             PDMP not reviewed this encounter.     ,  P, PA-C 10/23/22  1210

## 2022-10-24 LAB — RPR: RPR Ser Ql: NONREACTIVE

## 2022-11-08 ENCOUNTER — Other Ambulatory Visit: Payer: Self-pay

## 2022-11-09 ENCOUNTER — Telehealth: Payer: Self-pay | Admitting: Pharmacist

## 2022-11-09 NOTE — Telephone Encounter (Signed)
[facility] and clinic pharmacy staff have been trying to contact patient for refills of Biktarvy with no luck.  Patient last filled on 08/11/22. No appointments scheduled.  Larry Lutz L. Larry Lutz, PharmD [facility] Clinical Pharmacist Practitioner

## 2023-03-14 ENCOUNTER — Telehealth: Payer: Medicaid Other | Admitting: Family Medicine

## 2023-03-14 ENCOUNTER — Other Ambulatory Visit: Payer: Self-pay

## 2023-03-14 NOTE — Progress Notes (Signed)
The patient no-showed for appointment despite this provider sending direct link, reaching out via phone with no response and waiting for at least 10 minutes from appointment time for patient to join. They will be marked as a NS for this appointment/time.    M , NP    

## 2023-03-14 NOTE — Telephone Encounter (Signed)
Patient called requesting refill on Biktarvy. Patient also c/o sore throat, swollen glands. Informed patient we do not have any availbility in our office today and advised that he go to UC to be evaluated. Scheduled for follow up with Dixon,NP on 03/24/23 for follow up.    Reiko Vinje [name], CMA

## 2023-03-24 ENCOUNTER — Encounter: Payer: Self-pay | Admitting: Infectious Diseases

## 2023-03-24 ENCOUNTER — Other Ambulatory Visit: Payer: Self-pay

## 2023-03-24 ENCOUNTER — Ambulatory Visit (INDEPENDENT_AMBULATORY_CARE_PROVIDER_SITE_OTHER): Payer: Medicaid Other | Admitting: Infectious Diseases

## 2023-03-24 VITALS — BP 124/82 | HR 110 | Temp 98.3°F | Ht 66.0 in | Wt 136.0 lb

## 2023-03-24 DIAGNOSIS — R634 Abnormal weight loss: Secondary | ICD-10-CM | POA: Diagnosis not present

## 2023-03-24 DIAGNOSIS — B20 Human immunodeficiency virus [HIV] disease: Secondary | ICD-10-CM

## 2023-03-24 DIAGNOSIS — R1310 Dysphagia, unspecified: Secondary | ICD-10-CM

## 2023-03-24 MED ORDER — SULFAMETHOXAZOLE-TRIMETHOPRIM 800-160 MG PO TABS: 1.0000 | ORAL_TABLET | ORAL | 1 refills | Status: DC

## 2023-03-24 MED ORDER — DOXYCYCLINE HYCLATE 100 MG PO TABS: 100.0000 mg | ORAL_TABLET | Freq: Two times a day (BID) | ORAL | 0 refills | Status: DC

## 2023-03-24 MED ORDER — VALACYCLOVIR HCL 1 G PO TABS: 1000.0000 mg | ORAL_TABLET | Freq: Two times a day (BID) | ORAL | 0 refills | Status: AC

## 2023-03-24 MED ORDER — FLUCONAZOLE 200 MG PO TABS: 200.0000 mg | ORAL_TABLET | Freq: Every day | ORAL | 0 refills | Status: DC

## 2023-03-24 MED ORDER — BICTEGRAVIR-EMTRICITAB-TENOFOV 50-200-25 MG PO TABS
1.0000 | ORAL_TABLET | Freq: Every day | ORAL | 11 refills | Status: DC
Start: 2023-03-24 — End: 2023-10-31

## 2023-03-24 NOTE — Patient Instructions (Addendum)
 Plan:  START the Doxcycline 100 mg tablet - one tablet twice a day with food - take this for 14 days   START the Valtrex  1000 mg tablet - one tablet twice a day with food - take this for 14 days   START the Diflucan  200 mg tablet - one tablet once a day - take this for 7 days   START the Bactrim  1 tablet three times  week   START the Biktarvy  back 1 tablet once a day    I am going to refer you to [facility] to help with coordinating you getting back on track.    Please call your GI team in [place] - we are going to need their help to figure out the weight loss, trouble swallowing and diarrhea McKimmie, Bernardino CROME, MD  [address]  [place], [place] [address]  Phone: [phone]

## 2023-03-24 NOTE — Progress Notes (Signed)
 [facility] Counselor Referral Notice   Last HIV Viral Load:  HIV 1 RNA Quant  Date Value Ref Range Status  10/09/2021 44 (H) Copies/mL Final    Last CD4 Count:  CD4 T Cell Abs  Date Value Ref Range Status  07/12/2022 <35 (L) 400 - 1,790 /uL Final    Last [facility] Visit: 03/24/23  Barriers to care include substance use, unstable housing, poor insight into overall health, poor medication adherence, transportation.    Duration of Service: 10 minutes  Tarence Searcy D Jassica Zazueta, RN

## 2023-03-24 NOTE — Progress Notes (Signed)
 Patient Name: [name]  DOB: 13-Apr-1990  MRN: [id number]   Brief Narrative:  Larry Lutz is a 33 y.o. male with HIV, Dx in 2017 in [place] (no records available). Inconsistent with medication adherence. 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  History of Present Illness   Larry Lutz is here for follow up. I saw him last in April 2024 in an attempt to get him back on HIV treatment and help to manage symptoms due to perianal fistula and ulcerations. At that time his CD4 count was < 35 consistent with AIDS. Today he is concerned about significant weight loss and a history of poor medication adherence. He has filled only two months of medication in the previous year.   He has been experiencing difficulty swallowing and has not eaten today but has access to food. The patient also reported having a draining fistula and ulcers in the anal area, which were painful and tender. The patient had been advised to see a surgeon and undergo a colonoscopy, but did not comply due to an inability to tolerate the preparatory regimen. He has not been back to care with GI team since.   He wonders if he should go back on prednisone  for known UC - states he has had significant weight loss and non-bloody diarrhea.   He is currently staying at a hotel at the time of the consultation and has occasional access to transportation through other people. The patient expressed a willingness to restart treatment and showed concern about his deteriorating health status. He called his father to discuss my concerns today over the phone with full permission to discuss all pertinent health details. He expressed  willingness to support the patient in managing his health. The patient was staying in a hotel at the time of the consultation and had occasional access to transportation.      H/O perirectal abscess / fistula. Concerned about this last OV in 2024 but did not follow up with surgery team. Seen in ER in [place] for same in November but he left before being seen.   Has not filled Biktarvy  since May 2024. Only fi    Review of Systems  All other systems reviewed and are negative.    Past Medical History:  Diagnosis Date   Anxiety    Asthma    Colitis    Colon polyp    Crohn disease (HCC)    Depressed    GI bleed    HIV (human immunodeficiency virus infection) (HCC)    Hypertension    IBS (irritable bowel syndrome)    UC (ulcerative colitis) (HCC)    No Known Allergies    Outpatient Medications Prior to Visit  Medication Sig Dispense Refill   cetirizine  (ZYRTEC ) 10 MG tablet Take 1 tablet (10 mg total) by mouth daily as needed. (Patient not taking: Reported on 03/24/2023) 30 tablet 0   hydrochlorothiazide (MICROZIDE) 12.5 MG capsule Take 12.5 mg by mouth daily. (Patient not taking: Reported on 03/24/2023)     lidocaine  (XYLOCAINE ) 5 % ointment Apply 1 Application topically as needed. (Patient not taking: Reported on 03/24/2023) 35.44  g 0   lisinopril  (ZESTRIL ) 20 MG tablet Take 20 mg by mouth daily. (Patient not taking: Reported on 03/24/2023)     mesalamine  (LIALDA ) 1.2 g EC tablet Take 4.8 g by mouth daily with breakfast. (Patient not taking: Reported on 03/24/2023)     mirtazapine  (REMERON ) 30 MG tablet Take 30 mg by mouth at bedtime. (Patient not taking: Reported on 03/24/2023)     nystatin  (MYCOSTATIN ) 100000 UNIT/ML suspension Take 5 mLs (500,000 Units total) by mouth 4 (four) times daily. (Patient not taking: Reported on 03/24/2023) 60 mL 0   predniSONE  (DELTASONE ) 20 MG tablet Take 3 pills (60mg ) daily for 3 days then 2 pills (40mg ) daily for 3 days then 1 pill (20mg ) daily for 3 days  (Patient not taking: Reported on 03/24/2023) 18 tablet 0   sertraline (ZOLOFT) 100 MG tablet Take 100 mg by mouth daily. (Patient not taking: Reported on 03/24/2023)     bictegravir-emtricitabine -tenofovir  AF (BIKTARVY ) 50-200-25 MG TABS tablet Take 1 tablet by mouth daily. Try to take at the same time each day with or without food. (Patient not taking: Reported on 03/24/2023) 30 tablet 5   No facility-administered medications prior to visit.    Social History   Tobacco Use   Smoking status: Every Day   Smokeless tobacco: Never  Vaping Use   Vaping status: Never Used  Substance Use Topics   Alcohol use: Yes   Drug use: Yes    Types: Marijuana, Methamphetamines, Crack cocaine    Objective    Physical Exam and Objective Findings:  Vitals:   03/24/23 1543  BP: 124/82  Pulse: (!) 110  Temp: 98.3 F (36.8 C)  TempSrc: Temporal  SpO2: 97%  Weight: 136 lb (61.7 kg)  Height: 5' 6 (1.676 m)    Body mass index is 21.95 kg/m.    Physical Exam  Lab Results Lab Results  Component Value Date   WBC 9.4 06/15/2021   HGB 10.5 (L) 06/15/2021   HCT 34.8 (L) 06/15/2021   MCV 66.4 (L) 06/15/2021   PLT 525 (H) 06/15/2021    Lab Results  Component Value Date   CREATININE 1.02 06/15/2021   BUN 11 06/15/2021   NA 138 06/15/2021   K 4.6 06/15/2021   CL 101 06/15/2021   CO2 27 06/15/2021    Lab Results  Component Value Date   ALT 12 06/15/2021   AST 16 06/15/2021   ALKPHOS 71 05/04/2018   BILITOT 0.4 06/15/2021    Lab Results  Component Value Date   CHOL 236 (H) 01/05/2017   HDL 45 01/05/2017   LDLCALC 163 (H) 01/05/2017   TRIG 138 01/05/2017   CHOLHDL 5.2 (H) 01/05/2017   HIV 1 RNA Quant  Date Value  10/09/2021 44 Copies/mL (H)  06/15/2021 84 copies/mL (H)  08/15/2018 176 copies/mL (H)   CD4 T Cell Abs (/uL)  Date Value  07/12/2022 <35 (L)  06/15/2021 440  08/15/2018 701       ASSESSMENT & PLAN:      HIV/AIDS - Significant weight loss and poor adherence  to antiretroviral therapy (Biktarvy ) over the past year. Discussed the importance of consistent medication use and the potential for recovery with consistent treatment. He should notice a recovery in weight and overall health over the next few months of CONSISTENT treatment. Will send in bactrim  due to previously known low CD4 < 35.  Bridge referral to [facility] for assistance with adherence and care coordination.  -Resume Biktarvy  daily. -Refer to bridge  counselor for additional support and medication adherence.  Anal Fistula with Ulcerations -  Persistent and painful, previously attempted referral to surgery without success due to patient non-compliance.  -Attempt to coordinate surgical consultation again. -Trial of valtrex  and doxycycline    Dysphagia - New onset, likely secondary to untreated HIV/AIDS. Discussed the need for endoscopy to evaluate. Treat especially for HSV vs Candidiasis process. See how he is in 3 weeks at follow up.  -Refer to Gastroenterology for endoscopy. -Valtrex  and Fluconazole   -ER precautions discussed to proceed with ER evaluation and hospitalization if he cannot keep pills, fluids or foods down.   Ulcerative Colitis - History of, but current status unclear due to patient non-compliance with colonoscopy. No bloody diarrhea described. Unclear as to what process is contributing to his diarrhea - untreated UC, untreated HIV or re-emergence of CMV colitis.  -CMV PCR quant today -Refer to Gastroenterology for colonoscopy.  Follow-up in 3 weeks to assess response to treatment and progress.      Meds ordered this encounter  Medications   valACYclovir  (VALTREX ) 1000 MG tablet    Sig: Take 1 tablet (1,000 mg total) by mouth 2 (two) times daily for 14 days.    Dispense:  28 tablet    Refill:  0    Supervising Provider:   EFRAIN LAMAR ORN [[id number]]   DISCONTD: doxycycline  (VIBRA -TABS) 100 MG tablet    Sig: Take 1 tablet (100 mg total) by mouth 2 (two) times daily.     Dispense:  20 tablet    Refill:  0    Supervising Provider:   EFRAIN LAMAR ORN [[id number]]   bictegravir-emtricitabine -tenofovir  AF (BIKTARVY ) 50-200-25 MG TABS tablet    Sig: Take 1 tablet by mouth daily. Try to take at the same time each day with or without food.    Dispense:  30 tablet    Refill:  11    Supervising Provider:   EFRAIN LAMAR ORN [phone]    Prescription Type::   Renewal   fluconazole  (DIFLUCAN ) 200 MG tablet    Sig: Take 1 tablet (200 mg total) by mouth daily.    Dispense:  14 tablet    Refill:  0    Supervising Provider:   EFRAIN LAMAR ORN [[id number]]   doxycycline  (VIBRA -TABS) 100 MG tablet    Sig: Take 1 tablet (100 mg total) by mouth 2 (two) times daily.    Dispense:  28 tablet    Refill:  0    Supervising Provider:   EFRAIN LAMAR ORN [[id number]]   sulfamethoxazole -trimethoprim  (BACTRIM  DS) 800-160 MG tablet    Sig: Take 1 tablet by mouth 3 (three) times a week.    Dispense:  30 tablet    Refill:  1    Supervising Provider:   EFRAIN LAMAR ORN [[id number]]    No orders of the defined types were placed in this encounter.  Total encounter time: 42 minutes most of which was in face to face discussion and coordination of care. He said he had to leave and will call back to schedule his next follow up and he accepted a prescription for labs to be drawn at [facility].   I worry he won't come back again and risk his health deteriorating further.  I emphatically expressed my concerns to him today.    Corean Fireman, MSN, NP-C [facility] Pager: [phone]

## 2023-03-30 ENCOUNTER — Telehealth: Payer: Self-pay

## 2023-03-30 NOTE — Telephone Encounter (Signed)
 Called [facility] to check in and see if he was able to get his Biktarvy . Reports he did pick it up from the pharmacy and has not missed any doses since picking it up. States the only medication he was unable to get was the doxycycline  because it wasn't ready yet.   Encouraged him to call the pharmacy to see if it's ready and pick it up. Scheduled for 3 week follow up. Asked him to please call if he has any trouble with his medications.   Amay Mijangos D Makinzey Banes, RN

## 2023-04-18 ENCOUNTER — Ambulatory Visit: Payer: Medicaid Other | Admitting: Infectious Diseases

## 2023-04-26 ENCOUNTER — Telehealth: Payer: Self-pay

## 2023-04-26 NOTE — Telephone Encounter (Signed)
Called Larry Lutz to reschedule missed appointment and ensure he's been able to get his medication. Call could not be completed.   Sandie Ano, RN

## 2023-06-21 ENCOUNTER — Ambulatory Visit: Admitting: Licensed Clinical Social Worker

## 2023-07-12 ENCOUNTER — Other Ambulatory Visit: Payer: Self-pay

## 2023-07-12 ENCOUNTER — Encounter: Payer: Self-pay | Admitting: Infectious Diseases

## 2023-07-12 ENCOUNTER — Ambulatory Visit (INDEPENDENT_AMBULATORY_CARE_PROVIDER_SITE_OTHER): Admitting: Infectious Diseases

## 2023-07-12 VITALS — BP 165/107 | HR 83 | Ht 66.0 in | Wt 166.0 lb

## 2023-07-12 DIAGNOSIS — L739 Follicular disorder, unspecified: Secondary | ICD-10-CM

## 2023-07-12 DIAGNOSIS — B20 Human immunodeficiency virus [HIV] disease: Secondary | ICD-10-CM

## 2023-07-12 DIAGNOSIS — F419 Anxiety disorder, unspecified: Secondary | ICD-10-CM

## 2023-07-12 MED ORDER — MIRTAZAPINE 30 MG PO TABS
30.0000 mg | ORAL_TABLET | Freq: Every day | ORAL | 5 refills | Status: DC
Start: 2023-07-12 — End: 2023-10-15

## 2023-07-12 MED ORDER — HYDROXYZINE PAMOATE 25 MG PO CAPS
25.0000 mg | ORAL_CAPSULE | Freq: Three times a day (TID) | ORAL | 2 refills | Status: DC | PRN
Start: 2023-07-12 — End: 2023-07-12

## 2023-07-12 MED ORDER — HYDROXYZINE PAMOATE 25 MG PO CAPS
25.0000 mg | ORAL_CAPSULE | Freq: Three times a day (TID) | ORAL | 2 refills | Status: DC | PRN
Start: 2023-07-12 — End: 2023-10-15

## 2023-07-12 MED ORDER — DOXYCYCLINE HYCLATE 100 MG PO TABS
100.0000 mg | ORAL_TABLET | Freq: Two times a day (BID) | ORAL | 0 refills | Status: DC
Start: 2023-07-12 — End: 2023-07-12

## 2023-07-12 MED ORDER — DOXYCYCLINE HYCLATE 100 MG PO TABS
100.0000 mg | ORAL_TABLET | Freq: Two times a day (BID) | ORAL | 0 refills | Status: AC
Start: 2023-07-12 — End: 2023-07-26

## 2023-07-12 NOTE — Progress Notes (Signed)
 Patient Name: Larry Lutz  DOB: 1990/06/02  MRN: [id number]   Brief Narrative:  Larry Lutz is a 33 y.o. male with HIV, Dx in 2017 in [place] (no records available). Inconsistent with medication adherence. 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  History of Present Illness   Larry Lutz is a 33 year old male here for HIV follow up; also with history of skin infections that are new.   He has a rash described as 'bumpy and pimpley,' primarily on his arms and upper thighs. He has a history of similar infections that responded well to antibiotics. He has been picking at the lesions, which may exacerbate the condition. Previous round of antibiotics   He has been living in a hotel for several months since being released from jail, which he attributes to potential exposure to irritants or allergens from laundry detergents used by the hotel. No evidence of bed bugs has been found.       Review of Systems  All other systems reviewed and are negative.   Past Medical History:  Diagnosis Date   Anxiety    Asthma    Colitis    Colon polyp    Crohn disease (HCC)    Depressed    GI bleed    HIV (human immunodeficiency virus infection) (HCC)    Hypertension    IBS (irritable bowel syndrome)    UC (ulcerative colitis) (HCC)    No Known Allergies    Outpatient Medications Prior to Visit  Medication Sig Dispense Refill   bictegravir-emtricitabine -tenofovir  AF (BIKTARVY ) 50-200-25 MG TABS tablet Take 1 tablet by mouth daily. Try to take at the same time each day with or without food. 30 tablet 11   fluconazole  (DIFLUCAN ) 200 MG tablet Take 1 tablet (200 mg total) by  mouth daily. 14 tablet 0   hydrochlorothiazide (MICROZIDE) 12.5 MG capsule Take 12.5 mg by mouth daily. (Patient not taking: Reported on 03/24/2023)     lidocaine  (XYLOCAINE ) 5 % ointment Apply 1 Application topically as needed. (Patient not taking: Reported on 03/24/2023) 35.44 g 0   lisinopril  (ZESTRIL ) 20 MG tablet Take 20 mg by mouth daily. (Patient not taking: Reported on 03/24/2023)     mesalamine  (LIALDA ) 1.2 g EC tablet Take 4.8 g by mouth daily with breakfast. (Patient not taking: Reported on 03/24/2023)     nystatin  (MYCOSTATIN ) 100000 UNIT/ML suspension Take 5 mLs (500,000 Units total) by mouth 4 (four) times daily. (Patient not taking: Reported on 03/24/2023) 60 mL 0   cetirizine  (ZYRTEC ) 10 MG tablet Take 1 tablet (10 mg total) by mouth daily as needed. (Patient not taking: Reported on 03/24/2023) 30 tablet 0   doxycycline  (VIBRA -TABS) 100 MG tablet Take 1 tablet (100 mg total) by mouth 2 (two) times daily. 28 tablet 0   mirtazapine  (REMERON ) 30 MG tablet Take 30 mg by mouth at bedtime. (Patient not taking: Reported on 03/24/2023)     predniSONE  (DELTASONE ) 20 MG tablet Take 3 pills (60mg ) daily for 3 days then 2 pills (40mg ) daily for 3 days then 1 pill (20mg ) daily for 3 days (Patient not  taking: Reported on 03/24/2023) 18 tablet 0   sertraline (ZOLOFT) 100 MG tablet Take 100 mg by mouth daily. (Patient not taking: Reported on 03/24/2023)     sulfamethoxazole -trimethoprim  (BACTRIM  DS) 800-160 MG tablet Take 1 tablet by mouth 3 (three) times a week. 30 tablet 1   No facility-administered medications prior to visit.    Social History   Tobacco Use   Smoking status: Every Day   Smokeless tobacco: Never  Vaping Use   Vaping status: Never Used  Substance Use Topics   Alcohol use: Yes   Drug use: Yes    Types: Marijuana, Methamphetamines, "Crack" cocaine    Objective    Physical Exam and Objective Findings:  Vitals:   07/12/23 1417  BP: (!) 165/107  Pulse: 83  SpO2: 100%  Weight: 166 lb  (75.3 kg)  Height: 5\' 6"  (1.676 m)    Body mass index is 26.79 kg/m.    Physical Exam Constitutional:      Appearance: Normal appearance. He is not ill-appearing.  HENT:     Head: Normocephalic.     Mouth/Throat:     Mouth: Mucous membranes are moist.     Pharynx: Oropharynx is clear.  Eyes:     General: No scleral icterus. Cardiovascular:     Rate and Rhythm: Normal rate.  Pulmonary:     Effort: Pulmonary effort is normal.  Musculoskeletal:        General: Normal range of motion.     Cervical back: Normal range of motion.  Skin:    Coloration: Skin is not jaundiced or pale.  Neurological:     Mental Status: He is alert and oriented to person, place, and time.  Psychiatric:        Mood and Affect: Mood normal.        Judgment: Judgment normal.     Lab Results Lab Results  Component Value Date   WBC 6.2 07/12/2023   HGB 11.7 (L) 07/12/2023   HCT 37.3 (L) 07/12/2023   MCV 72.6 (L) 07/12/2023   PLT 398 07/12/2023    Lab Results  Component Value Date   CREATININE 1.07 07/12/2023   BUN 21 07/12/2023   NA 140 07/12/2023   K 3.9 07/12/2023   CL 103 07/12/2023   CO2 30 07/12/2023    Lab Results  Component Value Date   ALT 13 07/12/2023   AST 22 07/12/2023   ALKPHOS 71 05/04/2018   BILITOT 0.4 07/12/2023    Lab Results  Component Value Date   CHOL 236 (H) 01/05/2017   HDL 45 01/05/2017   LDLCALC 163 (H) 01/05/2017   TRIG 138 01/05/2017   CHOLHDL 5.2 (H) 01/05/2017   HIV 1 RNA Quant  Date Value  07/12/2023 330 copies/mL (H)  10/09/2021 44 Copies/mL (H)  06/15/2021 84 copies/mL (H)   CD4 T Cell Abs (/uL)  Date Value  07/12/2023 157 (L)  07/12/2022 <35 (L)  06/15/2021 440       ASSESSMENT & PLAN:      HIV infection - AIDS with CD4 < 35 -   Improving immune function with weight gain and better appearance overall. Reports improved adherence to biktarvy .  - Check viral load and CD4 with blood work. - Rescreen for syphilis. - Check liver and  kidney function.  Skin infection -  C/W Folliculitis with 2 abscesses on the arms that are not terribly inflamed. Previous antibiotics effective. Evident on bilateral forearms. Denies any injection drug use or exposure to bugs.  Exacerbated by picking at skin.  - Prescribe doxycycline  for two weeks, take with food. - Advise against picking at lesions. - Use warm or cool compresses for itch relief.  Anxiety -  Anxiety may contribute to skin picking and sleep disturbances. Discussed hydroxyzine  for anxiety and sleep, Remeron  for anxiety, depression, sleep, and appetite. - Prescribe hydroxyzine  as needed for anxiety and sleep. - Prescribe Remeron , start with half a tablet at night for a week, then increase to a full tablet. - Consider Buspar if anxiety refractory to hydroxyzine  during the day.  Recording duration: 17 minutes      Meds ordered this encounter  Medications   DISCONTD: doxycycline  (VIBRA -TABS) 100 MG tablet    Sig: Take 1 tablet (100 mg total) by mouth 2 (two) times daily for 14 days.    Dispense:  28 tablet    Refill:  0   DISCONTD: hydrOXYzine  (VISTARIL ) 25 MG capsule    Sig: Take 1 capsule (25 mg total) by mouth 3 (three) times daily as needed.    Dispense:  30 capsule    Refill:  2   mirtazapine  (REMERON ) 30 MG tablet    Sig: Take 1 tablet (30 mg total) by mouth at bedtime.    Dispense:  30 tablet    Refill:  5   doxycycline  (VIBRA -TABS) 100 MG tablet    Sig: Take 1 tablet (100 mg total) by mouth 2 (two) times daily for 14 days.    Dispense:  28 tablet    Refill:  0   hydrOXYzine  (VISTARIL ) 25 MG capsule    Sig: Take 1 capsule (25 mg total) by mouth 3 (three) times daily as needed.    Dispense:  30 capsule    Refill:  2   sulfamethoxazole -trimethoprim  (BACTRIM  DS) 800-160 MG tablet    Sig: Take 1 tablet by mouth 3 (three) times a week.    Dispense:  15 tablet    Refill:  3    Orders Placed This Encounter  Procedures   HIV 1 RNA quant-no reflex-bld    T-helper cells (CD4) count   RPR   COMPLETE METABOLIC PANEL WITHOUT GFR   CBC w/Diff   AMB REFERRAL TO COMMUNITY SERVICE AGENCY    Referral Priority:   Routine    Referral Type:   Community Service    Number of Visits Requested:   1     Gibson Kurtz, MSN, NP-C [facility] Pager: [phone]

## 2023-07-12 NOTE — Patient Instructions (Signed)
 Doxycyline twice a day for 2 weeks to treat the infections on your arms - they feel like abscesses.  No picking your skin   The breakouts should get better as your immune system recovers. Need more time.   STOP the zoloft   START the Remeron  - start 1/2 tablet at night for a week then increase to a whole tablet.   START Hydroxyzine  as needed for anxiety during the day - can do 1 or 2 tabs. If it makes you too sleepy do 1/2 tab.   For [employer] - here is their [place] number. Call to leave a message - if you have not heard back within a week please let me know.  Address: [address], [place], [place] [address] Phone: [phone]

## 2023-07-13 LAB — T-HELPER CELLS (CD4) COUNT (NOT AT ARMC)
CD4 % Helper T Cell: 11 % — ABNORMAL LOW (ref 33–65)
CD4 T Cell Abs: 157 /uL — ABNORMAL LOW (ref 400–1790)

## 2023-07-14 LAB — CBC WITH DIFFERENTIAL/PLATELET
Absolute Lymphocytes: 1550 {cells}/uL (ref 850–3900)
Absolute Monocytes: 601 {cells}/uL (ref 200–950)
Basophils Absolute: 31 {cells}/uL (ref 0–200)
Basophils Relative: 0.5 %
Eosinophils Absolute: 465 {cells}/uL (ref 15–500)
Eosinophils Relative: 7.5 %
HCT: 37.3 % — ABNORMAL LOW (ref 38.5–50.0)
Hemoglobin: 11.7 g/dL — ABNORMAL LOW (ref 13.2–17.1)
MCH: 22.8 pg — ABNORMAL LOW (ref 27.0–33.0)
MCHC: 31.4 g/dL — ABNORMAL LOW (ref 32.0–36.0)
MCV: 72.6 fL — ABNORMAL LOW (ref 80.0–100.0)
MPV: 10.2 fL (ref 7.5–12.5)
Monocytes Relative: 9.7 %
Neutro Abs: 3553 {cells}/uL (ref 1500–7800)
Neutrophils Relative %: 57.3 %
Platelets: 398 10*3/uL (ref 140–400)
RBC: 5.14 10*6/uL (ref 4.20–5.80)
RDW: 15 % (ref 11.0–15.0)
Total Lymphocyte: 25 %
WBC: 6.2 10*3/uL (ref 3.8–10.8)

## 2023-07-14 LAB — HIV-1 RNA QUANT-NO REFLEX-BLD
HIV 1 RNA Quant: 330 {copies}/mL — ABNORMAL HIGH
HIV-1 RNA Quant, Log: 2.52 {Log_copies}/mL — ABNORMAL HIGH

## 2023-07-14 LAB — COMPLETE METABOLIC PANEL WITHOUT GFR
AG Ratio: 1 (calc) (ref 1.0–2.5)
ALT: 13 U/L (ref 9–46)
AST: 22 U/L (ref 10–40)
Albumin: 4.4 g/dL (ref 3.6–5.1)
Alkaline phosphatase (APISO): 72 U/L (ref 36–130)
BUN: 21 mg/dL (ref 7–25)
CO2: 30 mmol/L (ref 20–32)
Calcium: 10.2 mg/dL (ref 8.6–10.3)
Chloride: 103 mmol/L (ref 98–110)
Creat: 1.07 mg/dL (ref 0.60–1.26)
Globulin: 4.4 g/dL — ABNORMAL HIGH (ref 1.9–3.7)
Glucose, Bld: 58 mg/dL — ABNORMAL LOW (ref 65–99)
Potassium: 3.9 mmol/L (ref 3.5–5.3)
Sodium: 140 mmol/L (ref 135–146)
Total Bilirubin: 0.4 mg/dL (ref 0.2–1.2)
Total Protein: 8.8 g/dL — ABNORMAL HIGH (ref 6.1–8.1)

## 2023-07-14 LAB — RPR: RPR Ser Ql: NONREACTIVE

## 2023-07-14 MED ORDER — SULFAMETHOXAZOLE-TRIMETHOPRIM 800-160 MG PO TABS: 1.0000 | ORAL_TABLET | ORAL | 3 refills | Status: DC

## 2023-07-21 ENCOUNTER — Telehealth: Payer: Self-pay

## 2023-07-21 ENCOUNTER — Ambulatory Visit

## 2023-07-21 NOTE — Telephone Encounter (Signed)
 Patient partner is positive for syphilis.   Francena Infield with [facility] wanted to see if patient could be treated. Routing to provider.   RPR non-reactive on 4/29  Tristan Proto P Shima Compere, CMA

## 2023-07-21 NOTE — Telephone Encounter (Signed)
 Patient aware. Will come in for treatment this afternoon at 4 pm.

## 2023-07-25 NOTE — Telephone Encounter (Signed)
Attempted to contact patient - call couldn't be completed at this time.   Larry Lutz [name], CMA

## 2023-07-26 NOTE — Telephone Encounter (Signed)
 Attempted to contact patient - call couldn't be completed. Forwarding to [facility].

## 2023-09-12 ENCOUNTER — Ambulatory Visit: Admitting: Infectious Diseases

## 2023-10-15 ENCOUNTER — Emergency Department (HOSPITAL_COMMUNITY)

## 2023-10-15 ENCOUNTER — Inpatient Hospital Stay (HOSPITAL_COMMUNITY)
Admission: EM | Admit: 2023-10-15 | Discharge: 2023-10-18 | DRG: 603 | Attending: Infectious Diseases | Admitting: Infectious Diseases

## 2023-10-15 ENCOUNTER — Other Ambulatory Visit: Payer: Self-pay

## 2023-10-15 DIAGNOSIS — B9562 Methicillin resistant Staphylococcus aureus infection as the cause of diseases classified elsewhere: Secondary | ICD-10-CM | POA: Diagnosis present

## 2023-10-15 DIAGNOSIS — R197 Diarrhea, unspecified: Secondary | ICD-10-CM | POA: Diagnosis not present

## 2023-10-15 DIAGNOSIS — L02224 Furuncle of groin: Secondary | ICD-10-CM | POA: Diagnosis present

## 2023-10-15 DIAGNOSIS — F419 Anxiety disorder, unspecified: Secondary | ICD-10-CM | POA: Diagnosis present

## 2023-10-15 DIAGNOSIS — Z79899 Other long term (current) drug therapy: Secondary | ICD-10-CM | POA: Diagnosis not present

## 2023-10-15 DIAGNOSIS — L02821 Furuncle of head [any part, except face]: Secondary | ICD-10-CM | POA: Diagnosis present

## 2023-10-15 DIAGNOSIS — D509 Iron deficiency anemia, unspecified: Secondary | ICD-10-CM | POA: Insufficient documentation

## 2023-10-15 DIAGNOSIS — L03115 Cellulitis of right lower limb: Principal | ICD-10-CM

## 2023-10-15 DIAGNOSIS — D72829 Elevated white blood cell count, unspecified: Secondary | ICD-10-CM | POA: Insufficient documentation

## 2023-10-15 DIAGNOSIS — L299 Pruritus, unspecified: Secondary | ICD-10-CM | POA: Diagnosis present

## 2023-10-15 DIAGNOSIS — L739 Follicular disorder, unspecified: Secondary | ICD-10-CM | POA: Diagnosis present

## 2023-10-15 DIAGNOSIS — L02426 Furuncle of left lower limb: Secondary | ICD-10-CM | POA: Diagnosis present

## 2023-10-15 DIAGNOSIS — Z5901 Sheltered homelessness: Secondary | ICD-10-CM | POA: Diagnosis not present

## 2023-10-15 DIAGNOSIS — Z8042 Family history of malignant neoplasm of prostate: Secondary | ICD-10-CM | POA: Diagnosis not present

## 2023-10-15 DIAGNOSIS — Z21 Asymptomatic human immunodeficiency virus [HIV] infection status: Secondary | ICD-10-CM | POA: Diagnosis not present

## 2023-10-15 DIAGNOSIS — B20 Human immunodeficiency virus [HIV] disease: Secondary | ICD-10-CM

## 2023-10-15 DIAGNOSIS — L03314 Cellulitis of groin: Secondary | ICD-10-CM | POA: Diagnosis present

## 2023-10-15 DIAGNOSIS — Z833 Family history of diabetes mellitus: Secondary | ICD-10-CM | POA: Diagnosis not present

## 2023-10-15 DIAGNOSIS — L02425 Furuncle of right lower limb: Secondary | ICD-10-CM | POA: Diagnosis present

## 2023-10-15 DIAGNOSIS — K5190 Ulcerative colitis, unspecified, without complications: Secondary | ICD-10-CM | POA: Diagnosis present

## 2023-10-15 LAB — RPR: RPR Ser Ql: NONREACTIVE

## 2023-10-15 LAB — BASIC METABOLIC PANEL WITH GFR
Anion gap: 11 (ref 5–15)
BUN: 18 mg/dL (ref 6–20)
CO2: 23 mmol/L (ref 22–32)
Calcium: 9.1 mg/dL (ref 8.9–10.3)
Chloride: 106 mmol/L (ref 98–111)
Creatinine, Ser: 1.14 mg/dL (ref 0.61–1.24)
GFR, Estimated: >60 mL/min (ref >60)
Glucose, Bld: 94 mg/dL (ref 70–99)
Potassium: 3.8 mmol/L (ref 3.5–5.1)
Sodium: 140 mmol/L (ref 135–145)

## 2023-10-15 LAB — CBC WITH DIFFERENTIAL/PLATELET
Abs Immature Granulocytes: 0.06 K/uL (ref 0.00–0.07)
Basophils Absolute: 0 K/uL (ref 0.0–0.1)
Basophils Relative: 0 %
Eosinophils Absolute: 0.2 K/uL (ref 0.0–0.5)
Eosinophils Relative: 2 %
HCT: 35.9 % — ABNORMAL LOW (ref 39.0–52.0)
Hemoglobin: 10.7 g/dL — ABNORMAL LOW (ref 13.0–17.0)
Immature Granulocytes: 1 %
Lymphocytes Relative: 21 %
Lymphs Abs: 2.5 K/uL (ref 0.7–4.0)
MCH: 21.7 pg — ABNORMAL LOW (ref 26.0–34.0)
MCHC: 29.8 g/dL — ABNORMAL LOW (ref 30.0–36.0)
MCV: 72.7 fL — ABNORMAL LOW (ref 80.0–100.0)
Monocytes Absolute: 1.3 K/uL — ABNORMAL HIGH (ref 0.1–1.0)
Monocytes Relative: 10 %
Neutro Abs: 8.1 K/uL — ABNORMAL HIGH (ref 1.7–7.7)
Neutrophils Relative %: 66 %
Platelets: 366 K/uL (ref 150–400)
RBC: 4.94 MIL/uL (ref 4.22–5.81)
RDW: 17.5 % — ABNORMAL HIGH (ref 11.5–15.5)
WBC: 12.2 K/uL — ABNORMAL HIGH (ref 4.0–10.5)
nRBC: 0 % (ref 0.0–0.2)

## 2023-10-15 LAB — SEDIMENTATION RATE: Sed Rate: 62 mm/h — ABNORMAL HIGH (ref 0–16)

## 2023-10-15 LAB — C-REACTIVE PROTEIN: CRP: 7.8 mg/dL — ABNORMAL HIGH (ref <1.0)

## 2023-10-15 LAB — I-STAT CG4 LACTIC ACID, ED: Lactic Acid, Venous: 1.3 mmol/L (ref 0.5–1.9)

## 2023-10-15 MED ORDER — BICTEGRAVIR-EMTRICITAB-TENOFOV 50-200-25 MG PO TABS
1.0000 | ORAL_TABLET | Freq: Every day | ORAL | Status: DC
  Administered 2023-10-15 – 2023-10-18 (×4): 1 via ORAL
  Filled 2023-10-15 (×5): qty 1

## 2023-10-15 MED ORDER — VANCOMYCIN HCL IN DEXTROSE 1-5 GM/200ML-% IV SOLN
1000.0000 mg | Freq: Two times a day (BID) | INTRAVENOUS | Status: DC
  Administered 2023-10-15 – 2023-10-16 (×3): 1000 mg via INTRAVENOUS
  Filled 2023-10-15 (×4): qty 200

## 2023-10-15 MED ORDER — HYDROCODONE-ACETAMINOPHEN 5-325 MG PO TABS
1.0000 | ORAL_TABLET | Freq: Once | ORAL | Status: AC
  Administered 2023-10-15: 1 via ORAL
  Filled 2023-10-15: qty 1

## 2023-10-15 MED ORDER — OXYCODONE HCL 5 MG PO TABS
10.0000 mg | ORAL_TABLET | ORAL | Status: DC | PRN
  Administered 2023-10-15 – 2023-10-18 (×8): 10 mg via ORAL
  Filled 2023-10-15 (×8): qty 2

## 2023-10-15 MED ORDER — VANCOMYCIN HCL IN DEXTROSE 1-5 GM/200ML-% IV SOLN: 1000.0000 mg | Freq: Once | INTRAVENOUS | Status: DC

## 2023-10-15 MED ORDER — ENOXAPARIN SODIUM 40 MG/0.4ML IJ SOSY
40.0000 mg | PREFILLED_SYRINGE | INTRAMUSCULAR | Status: DC
  Administered 2023-10-15 – 2023-10-17 (×3): 40 mg via SUBCUTANEOUS
  Filled 2023-10-15 (×3): qty 0.4

## 2023-10-15 MED ORDER — HYDROXYZINE HCL 10 MG PO TABS
10.0000 mg | ORAL_TABLET | Freq: Three times a day (TID) | ORAL | Status: DC | PRN
  Administered 2023-10-15 – 2023-10-16 (×2): 10 mg via ORAL
  Filled 2023-10-15 (×2): qty 1

## 2023-10-15 MED ORDER — SODIUM CHLORIDE 0.9 % IV SOLN
2.0000 g | Freq: Once | INTRAVENOUS | Status: AC
  Administered 2023-10-15: 2 g via INTRAVENOUS
  Filled 2023-10-15: qty 12.5

## 2023-10-15 MED ORDER — ACETAMINOPHEN 650 MG RE SUPP: 650.0000 mg | Freq: Four times a day (QID) | RECTAL | Status: DC | PRN

## 2023-10-15 MED ORDER — ACETAMINOPHEN 325 MG PO TABS
650.0000 mg | ORAL_TABLET | Freq: Four times a day (QID) | ORAL | Status: DC | PRN
  Administered 2023-10-15 – 2023-10-18 (×6): 650 mg via ORAL
  Filled 2023-10-15 (×6): qty 2

## 2023-10-15 MED ORDER — VANCOMYCIN HCL 1500 MG/300ML IV SOLN
1500.0000 mg | Freq: Once | INTRAVENOUS | Status: AC
  Administered 2023-10-15: 1500 mg via INTRAVENOUS
  Filled 2023-10-15: qty 300

## 2023-10-15 MED ORDER — GERHARDT'S BUTT CREAM
TOPICAL_CREAM | Freq: Every day | CUTANEOUS | Status: DC
  Filled 2023-10-15: qty 60

## 2023-10-15 MED ORDER — IOHEXOL 350 MG/ML SOLN
75.0000 mL | Freq: Once | INTRAVENOUS | Status: AC | PRN
  Administered 2023-10-15: 75 mL via INTRAVENOUS

## 2023-10-15 MED ORDER — SULFAMETHOXAZOLE-TRIMETHOPRIM 800-160 MG PO TABS
1.0000 | ORAL_TABLET | ORAL | Status: DC
  Administered 2023-10-17: 1 via ORAL
  Filled 2023-10-15: qty 1

## 2023-10-15 MED ORDER — POLYETHYLENE GLYCOL 3350 17 G PO PACK: 17.0000 g | PACK | Freq: Every day | ORAL | Status: DC | PRN

## 2023-10-15 NOTE — ED Notes (Signed)
 Awaiting biktarvy  from main pharamcy

## 2023-10-15 NOTE — ED Notes (Signed)
 Patient transported to CT

## 2023-10-15 NOTE — Progress Notes (Signed)
 Pharmacy Antibiotic Note  Larry Lutz is a 33 y.o. male admitted on 10/15/2023 with cellulitis, furunculosis of leg.  Pharmacy has been consulted for vancomycin  dosing.  Plan: Received vancomycin  1500 mg IV loading dose x1 today Start a maintenance dose of vancomycin  1000 mg IV Q12h (eAUC 525, goal AUC 400-550, Scr 1.14, Vd 0.72)  Trend WBC, fever, renal function F/u cultures, clinical progress, levels as indicated De-escalate when able   Height: 5' 7 (170.2 cm) Weight: 70.3 kg (155 lb) IBW/kg (Calculated) : 66.1  Temp (24hrs), Avg:98.1 F (36.7 C), Min:97.5 F (36.4 C), Max:98.5 F (36.9 C)  Recent Labs  Lab 10/15/23 0326 10/15/23 0332  WBC 12.2*  --   CREATININE 1.14  --   LATICACIDVEN  --  1.3    Estimated Creatinine Clearance: 85.4 mL/min (by C-G formula based on SCr of 1.14 mg/dL).    No Known Allergies  Antimicrobials this admission: Cefepime  2000 mg x1 dose (8/2)  Vancomycin  8/2 >>   Microbiology results: 8/2 BCx: NGTD  Thank you for allowing pharmacy to be a part of this patient's care.  Feliciano Close, PharmD PGY2 Infectious Diseases Pharmacy Resident  10/15/2023 2:24 PM

## 2023-10-15 NOTE — ED Notes (Signed)
 Detention Technical sales engineer at bedside

## 2023-10-15 NOTE — ED Provider Notes (Signed)
 [place] EMERGENCY DEPARTMENT AT [facility] Provider Note   CSN: [id number] Arrival date & time: 10/15/23  0211    Patient presents with: Abscess   Larry Lutz is a 33 y.o. male here for evaluation of abscesses.  Brought in by [facility] for abscess.  Patient states he has had recurrent folliculitis to these areas however the last few days he has had significant redness, tenderness to multiple areas more so at his right lower extremity which has had significant swelling.  He has had chills and subjective fever at home.  States he is compliant with his HIV medication.  Follows with [facility], ID group.  States he has myalgias and overall does not feel good.  No chest pain, shortness of breath, cough, abdominal pain.  States his partner few months ago tested positive for syphilis he was supposed to get Bicillin  with ID however did not show up for his appointment.  He denies any GU rashes, rashes to palms or soles.  Denies IVDU.  Initially he was bit by a spider on his leg and right forearm however did not see any insect bites.   HPI     Prior to Admission medications   Medication Sig Start Date End Date Taking? Authorizing Provider  bictegravir-emtricitabine -tenofovir  AF (BIKTARVY ) 50-200-25 MG TABS tablet Take 1 tablet by mouth daily. Try to take at the same time each day with or without food. 03/24/23   [name], NP  fluconazole  (DIFLUCAN ) 200 MG tablet Take 1 tablet (200 mg total) by mouth daily. 03/24/23   [name], NP  hydrochlorothiazide (MICROZIDE) 12.5 MG capsule Take 12.5 mg by mouth daily. Patient not taking: Reported on 03/24/2023    [provider]  hydrOXYzine  (VISTARIL ) 25 MG capsule Take 1 capsule (25 mg total) by mouth 3 (three) times daily as needed. 07/12/23   [name], NP  lidocaine  (XYLOCAINE ) 5 % ointment Apply 1 Application topically as needed. Patient not taking: Reported on 03/24/2023 07/15/22   [name], NP  lisinopril   (ZESTRIL ) 20 MG tablet Take 20 mg by mouth daily. Patient not taking: Reported on 03/24/2023    [provider]  mesalamine  (LIALDA ) 1.2 g EC tablet Take 4.8 g by mouth daily with breakfast. Patient not taking: Reported on 03/24/2023    [provider]  mirtazapine  (REMERON ) 30 MG tablet Take 1 tablet (30 mg total) by mouth at bedtime. 07/12/23   [name], NP  nystatin  (MYCOSTATIN ) 100000 UNIT/ML suspension Take 5 mLs (500,000 Units total) by mouth 4 (four) times daily. Patient not taking: Reported on 03/24/2023 07/15/22   [name], NP  sulfamethoxazole -trimethoprim  (BACTRIM  DS) 800-160 MG tablet Take 1 tablet by mouth 3 (three) times a week. 07/15/23   [name], NP    Allergies: Patient has no known allergies.    Review of Systems  Constitutional:  Positive for chills, fatigue and fever.  HENT: Negative.    Respiratory: Negative.    Cardiovascular: Negative.   Gastrointestinal: Negative.   Genitourinary: Negative.   Musculoskeletal:  Positive for myalgias.  Skin:  Positive for wound.  Neurological: Negative.   All other systems reviewed and are negative.   Updated Vital Signs BP 134/89   Pulse 97   Temp 98.2 F (36.8 C) (Oral)   Resp 18   Ht 5' 7 (1.702 m)   Wt 70.3 kg   SpO2 100%   BMI 24.28 kg/m   Physical Exam Vitals and nursing  note reviewed.  Constitutional:      General: He is not in acute distress.    Appearance: He is well-developed. He is not ill-appearing, toxic-appearing or diaphoretic.  HENT:     Head: Normocephalic and atraumatic.     Nose: Nose normal.     Mouth/Throat:     Mouth: Mucous membranes are moist.  Eyes:     Pupils: Pupils are equal, round, and reactive to light.  Cardiovascular:     Rate and Rhythm: Normal rate and regular rhythm.     Pulses: Normal pulses.     Heart sounds: Normal heart sounds.  Pulmonary:     Effort: Pulmonary effort is normal. No respiratory distress.     Breath sounds: Normal  breath sounds.  Abdominal:     General: Bowel sounds are normal. There is no distension.     Palpations: Abdomen is soft.     Tenderness: There is no abdominal tenderness. There is no right CVA tenderness, left CVA tenderness or guarding.  Musculoskeletal:        General: Normal range of motion.     Cervical back: Normal range of motion and neck supple.     Comments: No bony tenderness, compartments soft, right lower extremity greater than left.  Skin:    General: Skin is warm and dry.     Capillary Refill: Capillary refill takes less than 2 seconds.     Findings: Rash present.     Comments: Significant edema, erythema, warmth to knee distally.  Multiple areas consistent with folliculitis.  No obvious fluctuance, or drainable abscess.  Neurological:     General: No focal deficit present.     Mental Status: He is alert and oriented to person, place, and time.     (all labs ordered are listed, but only abnormal results are displayed) Labs Reviewed  CBC WITH DIFFERENTIAL/PLATELET - Abnormal; Notable for the following components:      Result Value   WBC 12.2 (*)    Hemoglobin 10.7 (*)    HCT 35.9 (*)    MCV 72.7 (*)    MCH 21.7 (*)    MCHC 29.8 (*)    RDW 17.5 (*)    Neutro Abs 8.1 (*)    Monocytes Absolute 1.3 (*)    All other components within normal limits  CULTURE, BLOOD (ROUTINE X 2)  CULTURE, BLOOD (ROUTINE X 2)  BASIC METABOLIC PANEL WITH GFR  RPR  I-STAT CG4 LACTIC ACID, ED  I-STAT CG4 LACTIC ACID, ED    EKG: None  Radiology: DG Tibia/Fibula Right Result Date: 10/15/2023 EXAM: XR Tibia and fibula. 10/15/2023 03:17:31 AM COMPARISON: None available. CLINICAL HISTORY: Wound. FINDINGS: BONES AND JOINTS: No acute fracture. No focal osseous lesion. No joint dislocation. SOFT TISSUES: The soft tissues are unremarkable. IMPRESSION: 1. No significant abnormality. Electronically signed by: Franky Stanford MD 10/15/2023 03:23 AM EDT RP Workstation: [id number]   DG Forearm  Right Result Date: 10/15/2023 EXAM: 1 VIEW XRAY OF THE RIGHT FOREARM 10/15/2023 03:17:31 AM COMPARISON: None available. CLINICAL HISTORY: Wound. FINDINGS: BONES AND JOINTS: No acute fracture. No focal osseous lesion. No joint dislocation. SOFT TISSUES: The soft tissues are unremarkable. IMPRESSION: 1. No significant abnormality. Electronically signed by: Franky Stanford MD 10/15/2023 03:19 AM EDT RP Workstation: [id number]     Procedures   Medications Ordered in the ED  ceFEPIme  (MAXIPIME ) 2 g in sodium chloride  0.9 % 100 mL IVPB (has no administration in time range)  vancomycin  (VANCOREADY) IVPB 1500 mg/300 mL (  has no administration in time range)  HYDROcodone -acetaminophen  (NORCO/VICODIN) 5-325 MG per tablet 1 tablet (1 tablet Oral Given 10/15/23 7634)    33 year old history of HIV here for evaluation of fever and possible abscess.  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  Has noted over the last 2 days he has had progressive erythema, warmth and pain to his right tib-fib.  States he has had multiple other areas that he has tried picking at to see if they would pop however is not.  Initially thought he was bit by an insect however did not see anything.  He has had subjective fevers at home associated myalgias.  No chest pain, shortness of breath, cough, abd pain, nausea or vomiting.  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  Denies any current perirectal drainage or abscess.  States he is compliant with his HIV medications, currently taking Bactrim  for prophylaxis.  Here he is afebrile, nonseptic appearing however appears to not feel well.  He has multiple areas consistent with superficial abscesses to his right lower extremity, folliculitis, question MRSA.  He does have significant erythema, warmth and exquisite tenderness to his right lower extremity however compartments are soft.  Will plan on CT imaging right lower extremity.  He was started on vancomycin  and cefepime .  Will plan on  labs.  Labs and imaging personally viewed and interpreted:  CBC leukocytosis 12.2 Metabolic panel without significant abnormality Lactic 1.3 X-ray right tib-fib without gas, fracture, dislocation, x-ray right forearm without gas, fracture, dislocation    Care transferred to oncoming provider who will follow up on CT imaging and disposition                                    Medical Decision Making Amount and/or Complexity of Data Reviewed External Data Reviewed: labs, radiology and notes. Labs: ordered. Decision-making details documented in ED Course. Radiology: ordered and independent interpretation performed. Decision-making details documented in ED Course.  Risk OTC drugs. Prescription drug management. Decision regarding hospitalization. Diagnosis or treatment significantly limited by social determinants of health.        Final diagnoses:  Cellulitis of right lower extremity  Folliculitis  HIV infection, unspecified symptom status [facility])    ED Discharge Orders     None          Alvey Brockel A, PA-C 10/15/23 9353    Bari Charmaine FALCON, MD 10/16/23 [phone]

## 2023-10-15 NOTE — ED Notes (Addendum)
 Admitting team notified RN patient able to eat.

## 2023-10-15 NOTE — ED Notes (Signed)
 PA notified of IV establishment difficulties. IV team consult placed by RN

## 2023-10-15 NOTE — H&P (Signed)
 Date: 10/15/2023               Patient Name:  Larry Lutz MRN: [id number]  DOB: 10-17-1990 Age / Sex: 33 y.o., male   PCP: Patient, No Pcp Per         Medical Service: Internal Medicine Teaching Service         Attending Physician: Dr. Reyes Fenton      First Contact: Larry King, DO    Second Contact: Dr. Libby Blanch, DO          Pager Information: First Contact Pager: [phone]   Second Contact Pager: [phone]   SUBJECTIVE   Chief Complaint: painful bumps on leg  History of Present Illness: Larry Lutz is a 33 y.o. male with PMH of HIV and ulcerative colitis.   Presents with bumps on his right leg that are red and painful. He first noticed them 2-3 days ago. He says he can pop them and pus comes out. The pain worsened and eventually his whole body started to feel bad making him come to the ED. He did walk in to the ED but its now starting to hurt even to walk and he was put in a wheelchair when he got to the ED. He showed us  bumps on his anterior thigh and in his right groin. He also says theres a painful bump around his butt. He has never had anything like these before although he was recently around a friend who had something similar. He has not tried anything to alleviate the pain. Touch and movement make it worse. Yesterday he felt like he was getting a fever and lost his appetite. He hasn't eaten anything in over two days. He threw up yesterday, denies seeing blood in the vomit. His Bms have been normal but the last one was painful for him to wipe because of the bump being there. Denies changes in urination, new back pain, dysphagia and denies cough. Not currently SOB. Denies CP.   A couple weeks ago he thought he had a spider bite on his right wrist that was also painful and drained blood when he popped it. He's currently living in a hotel and is wondering if there are bed bugs. He has noticed a similar spot on his head and sometimes get bumps in his armpits.   He saw Corean Fireman in April and doesn't remember starting an antibiotic. He states he is compliant with his meds.   ED Course: Labs significant for       Labs Reviewed  CBC WITH DIFFERENTIAL/PLATELET - Abnormal; Notable for the following components:      Result Value     WBC 12.2 (*)      Hemoglobin 10.7 (*)      HCT 35.9 (*)      MCV 72.7 (*)      MCH 21.7 (*)      MCHC 29.8 (*)      RDW 17.5 (*)      Neutro Abs 8.1 (*)      Monocytes Absolute 1.3 (*)      CRP                               7.8       ESR           62      RPR           Non reactive Blood  cultures X2 pending  Imaging Xray R forearm and tib/fib: no significant abnormality. CT RLE no abscesses, consistent with cellulitis on right lower leg, right anterior thigh, and right posterolateral thigh. Enlarged inguinal lymph favoring reactive LAD. No osseous findings.  Received cefepime  and vanc Consulted internal med  Meds:  Biktarvy  Bactrim  Hydroxyzine  Patient reported: yes  Current Meds  Medication Sig   bictegravir-emtricitabine -tenofovir  AF (BIKTARVY ) 50-200-25 MG TABS tablet Take 1 tablet by mouth daily. Try to take at the same time each day with or without food.    Past Medical History HIV, ulcerative colitis  Past Surgical History Past Surgical History:  Procedure Laterality Date   BRAIN SURGERY     COLONOSCOPY     in [place]  around 2017 or 2018    Social:  Lives with: Hotel and alone  Occupation: used to work in hotels  Support: Good support system  PCP: No PCP, does see ID regularly  Level of function: Independent in All ADLs and I ADLs  Substances: No substance use   Family History:  Family History  Problem Relation Age of Onset   Ulcerative colitis Mother    Irritable bowel syndrome Mother    Prostate cancer Paternal Grandfather    Diabetes Paternal Aunt    Colon cancer Neg Hx    Esophageal cancer Neg Hx    Rectal cancer Neg Hx    Stomach cancer Neg Hx      Allergies: Allergies as of  10/15/2023   (No Known Allergies)    Review of Systems: A complete ROS was negative except as per HPI.   OBJECTIVE:   Physical Exam: Blood pressure (!) 141/78, pulse 97, temperature 98.7 F (37.1 C), temperature source Oral, resp. rate 18, height 5' 7 (1.702 m), weight 70.3 kg, SpO2 100%.  Constitutional: uncomfortable appearing male sitting in ED bed, in no acute distress and not toxic appearing  HENT: normocephalic atraumatic, scar on scalp from previous surgery, mucous membranes moist Eyes: conjunctiva non-erythematous, not icteric, EOM intact Neck: supple Cardiovascular: regular rate and rhythm, no m/r/g Pulmonary/Chest: normal work of breathing on room air, lungs clear to auscultation bilaterally Abdominal: soft, non-distended Neurological: alert & oriented x 3, 5/5 strength in bilateral upper and lower extremities, normal gait Skin: folliculitis with single pustule with surrounding erythema on anterior thigh and anterior lower leg, two indurated nodules along right groin that are warm to touch and tender for patient. Papule around anal rim, minor inflammation. Hemorrhagic scab on right wrist and right parietal scalp. Diffuse piloerection. Patient itching abdomen.  Psych: appropriate  Labs: CBC    Component Value Date/Time   WBC 12.2 (H) 10/15/2023 0326   RBC 4.94 10/15/2023 0326   HGB 10.7 (L) 10/15/2023 0326   HCT 35.9 (L) 10/15/2023 0326   PLT 366 10/15/2023 0326   MCV 72.7 (L) 10/15/2023 0326   MCH 21.7 (L) 10/15/2023 0326   MCHC 29.8 (L) 10/15/2023 0326   RDW 17.5 (H) 10/15/2023 0326   LYMPHSABS 2.5 10/15/2023 0326   MONOABS 1.3 (H) 10/15/2023 0326   EOSABS 0.2 10/15/2023 0326   BASOSABS 0.0 10/15/2023 0326     CMP     Component Value Date/Time   NA 140 10/15/2023 0326   K 3.8 10/15/2023 0326   CL 106 10/15/2023 0326   CO2 23 10/15/2023 0326   GLUCOSE 94 10/15/2023 0326   BUN 18 10/15/2023 0326   CREATININE 1.14 10/15/2023 0326   CREATININE 1.07  07/12/2023 1455   CALCIUM 9.1 10/15/2023 0326  PROT 8.8 (H) 07/12/2023 1455   ALBUMIN 3.2 (L) 05/04/2018 0525   AST 22 07/12/2023 1455   ALT 13 07/12/2023 1455   ALKPHOS 71 05/04/2018 0525   BILITOT 0.4 07/12/2023 1455   GFRNONAA >60 10/15/2023 0326   GFRNONAA 86 08/15/2018 1437   GFRAA 99 08/15/2018 1437    Imaging:  CT EXTREMITY LOWER RIGHT W CONTRAST Result Date: 10/15/2023 EXAM: CT RIGHT LOWER EXTREMITY, WITH IV CONTRAST 10/15/2023 09:18:06 AM TECHNIQUE: Axial images were acquired through the right lower extremity with IV contrast. Reformatted images were reviewed. Automated exposure control, iterative reconstruction, and/or weight based adjustment of the mA/kV was utilized to reduce the radiation dose to as low as reasonably achievable. COMPARISON: None available. CLINICAL HISTORY: Soft tissue infection suspected, lower leg, xray done. Table formatting from the original note was not included. PT BIB PD from [facility] jail for a multiple abscess along his right leg. PT states it began 2 days ago. Pt states he believes he was bit by a spider on his Rt arm about a week ago. FINDINGS: BONES AND JOINTS: No acute fracture or focal osseous lesion. No dislocation. The joint spaces are normal. SOFT TISSUES: Mildly enlarged right external iliac and right inguinal lymph nodes are identified. The largest is in the right inguinal region measuring 1.4 cm, image 80/4. Subcutaneous soft tissue stranding with overlying skin thickening is noted extending along the anterior and medial aspect of the thigh to the level of the midshaft of the right femur axial images 90/4 through axial image 172/4. A focal area of skin thickening within the proximal aspect of the anterior medial thigh is noted, measuring 9 mm in thickness, image 17/4. No focal fluid collection is identified within this area. Skin thickening and subcutaneous edema is noted extending along the lateral aspect of the right thigh, image 149/4. No soft  tissue gas identified along the length of the right lower extremity. There is diffuse subcutaneous soft tissue edema involving the right lower leg beginning at the level of the tibial tubercle and extending into the medial ankle. IMPRESSION: 1. Diffuse subcutaneous soft tissue edema involving the right lower leg, beginning at the level of the tibial tubercle and extending into the medial ankle. Findings compatible with cellulitis. No abscess identified. 2. Subcutaneous soft tissue stranding with overlying skin thickening along the anterior and medial aspect of the thigh to the level of the midshaft of the right femur, with a focal area of skin thickening within the proximal aspect of the anterior medial thigh measuring 9 mm in thickness. No focal fluid collection identified within this area. Imaging findings are favored to represent cellulitis. 3. Signs of cellulitis also noted involving the posterolateral aspect of the right thigh 4. Mildly enlarged right external iliac and right inguinal lymph nodes, with the largest in the right inguinal region measuring 1.4 cm. Favor reactive adenopathy 5. No acute osseous findings. Electronically signed by: Waddell Calk MD 10/15/2023 09:49 AM EDT RP Workstation: [id number]   DG Tibia/Fibula Right Result Date: 10/15/2023 EXAM: XR Tibia and fibula. 10/15/2023 03:17:31 AM COMPARISON: None available. CLINICAL HISTORY: Wound. FINDINGS: BONES AND JOINTS: No acute fracture. No focal osseous lesion. No joint dislocation. SOFT TISSUES: The soft tissues are unremarkable. IMPRESSION: 1. No significant abnormality. Electronically signed by: Franky Stanford MD 10/15/2023 03:23 AM EDT RP Workstation: [id number]   DG Forearm Right Result Date: 10/15/2023 EXAM: 1 VIEW XRAY OF THE RIGHT FOREARM 10/15/2023 03:17:31 AM COMPARISON: None available. CLINICAL HISTORY: Wound. FINDINGS: BONES AND JOINTS: No  acute fracture. No focal osseous lesion. No joint dislocation. SOFT TISSUES: The soft tissues  are unremarkable. IMPRESSION: 1. No significant abnormality. Electronically signed by: Franky Stanford MD 10/15/2023 03:19 AM EDT RP Workstation: [id number]     ASSESSMENT & PLAN:   Assessment & Plan by Problem: Principal Problem:   Folliculitis Active Problems:   Ulcerative colitis (HCC)   Cellulitis of right lower extremity   AIDS (HCC)   Leukocytosis   Microcytic anemia   Nachum Hansson is a 33 y.o. person living with a history of HIV who presented with painful bumps on legs and admitted for cellulitis and leukocytosis on hospital day 0  #Right groin, thigh, and leg cellulitis #Inguinal LAD #Furuncle CT indicates cellulitis on lower leg, anterior thigh, posterolateral thigh as well as iliac and inguinal LAD with no signs of abscess or osseous change. Patient in pain with gentle palpation and difficulty ambulating.  -IV vanc -Blood cultures taken in ED, monitor -PT/OT ordered  -acetaminophen  650 mg, oxycodone  10 mg   #Leukocytosis, likely infectious 12.2 on admission. Patient reported having fevers although afebrile on admission.  -Trend WBC, monitor blood cultures  #HIV on Biktarvy  Patient last saw ID on 07/12/23, per note, patient immune status improving with weight gain and reports improved adherence to therapy. Patient had folliculitis on the BL forearms at that time and was prescribed doxy for 2 weeks. HIV RNA 330, CD4 157, RPR nonreactive -Continue Biktarvy  and Bactrim  -HIV RNA and CD4 ordered  #Anxiety and itching ID on 07/12/23 prescribed hydroxyzine  for anxiety.  -Continue hydroxyzine  10 mg prn   Best practice: Diet: Normal VTE: Enoxaparin  Code: Full  Disposition planning: Prior to Admission Living Arrangement: living in hotel Anticipated Discharge Location: Home  Dispo: Admit patient to internal medicine.   Signed: Charmayne Holmes, DO Internal Medicine Resident  10/15/2023, 5:52 PM  Please contact IM Residency On-Call Pager at: [phone] or [phone].

## 2023-10-15 NOTE — Hospital Course (Addendum)
 [facility]RA

## 2023-10-15 NOTE — ED Provider Notes (Signed)
  Physical Exam  BP (!) 133/94   Pulse 95   Temp 98.2 F (36.8 C) (Oral)   Resp 16   Ht 5' 7 (1.702 m)   Wt 70.3 kg   SpO2 99%   BMI 24.28 kg/m   Physical Exam Musculoskeletal:     Right lower leg: Edema present.     Comments: Entire right lower with swelling, redness, folliculitis.      Procedures  Procedures  ED Course / MDM   Clinical Course as of 10/15/23 0653  Sat Oct 15, 2023  9349 Needs admit for lower leg cellulitis, awaiting CT right lower ext.   [JB]    Clinical Course User Index [JB] Dionysios Massman, Warren SAILOR, PA-C   Medical Decision Making Amount and/or Complexity of Data Reviewed Labs: ordered. Radiology: ordered.  Risk Prescription drug management. Decision regarding hospitalization.   Patient from jail with cellulitis to right leg, red, tender, swelling. Is compliant with HIV medication, although ID notes inconsistent complaint. Has missed last apt with them. Has had chills/fever prior at home. This started 2 days ago has progressively worsened since rapidly, vitals okay here.   CT scan of right leg shows diffuse soft tissue swelling of entire right leg compatible with cellulitis.  No obvious abscess identified.  Given extensive involvement, comorbidities will admit for observation and IV antibiotic.  Hospital unassigned was consulted for admission.       Larry Lutz 10/15/23 1203    Melvenia Motto, MD 10/15/23 [phone]

## 2023-10-15 NOTE — ED Notes (Signed)
 IV team at bedside

## 2023-10-15 NOTE — ED Triage Notes (Signed)
 PT BIB PD from [facility]  jail for a multiple abscess along his right leg. PT states it began 2 days ago. Pt states he believes he was bit by a spider on his Rt arm about a week ago.

## 2023-10-15 NOTE — ED Notes (Signed)
 Fairy called by this RN and updated that patient is coming up with PD

## 2023-10-15 NOTE — ED Notes (Signed)
 Admitting at bedside

## 2023-10-16 DIAGNOSIS — L739 Follicular disorder, unspecified: Secondary | ICD-10-CM

## 2023-10-16 DIAGNOSIS — Z21 Asymptomatic human immunodeficiency virus [HIV] infection status: Secondary | ICD-10-CM

## 2023-10-16 LAB — BASIC METABOLIC PANEL WITH GFR
Anion gap: 10 (ref 5–15)
BUN: 16 mg/dL (ref 6–20)
CO2: 25 mmol/L (ref 22–32)
Calcium: 8.5 mg/dL — ABNORMAL LOW (ref 8.9–10.3)
Chloride: 100 mmol/L (ref 98–111)
Creatinine, Ser: 1.11 mg/dL (ref 0.61–1.24)
GFR, Estimated: >60 mL/min (ref >60)
Glucose, Bld: 99 mg/dL (ref 70–99)
Potassium: 3.5 mmol/L (ref 3.5–5.1)
Sodium: 135 mmol/L (ref 135–145)

## 2023-10-16 LAB — CBC
HCT: 35.2 % — ABNORMAL LOW (ref 39.0–52.0)
Hemoglobin: 10.8 g/dL — ABNORMAL LOW (ref 13.0–17.0)
MCH: 22 pg — ABNORMAL LOW (ref 26.0–34.0)
MCHC: 30.7 g/dL (ref 30.0–36.0)
MCV: 71.5 fL — ABNORMAL LOW (ref 80.0–100.0)
Platelets: 382 K/uL (ref 150–400)
RBC: 4.92 MIL/uL (ref 4.22–5.81)
RDW: 17.4 % — ABNORMAL HIGH (ref 11.5–15.5)
WBC: 10.2 K/uL (ref 4.0–10.5)
nRBC: 0 % (ref 0.0–0.2)

## 2023-10-16 LAB — MRSA NEXT GEN BY PCR, NASAL: MRSA by PCR Next Gen: DETECTED — AB

## 2023-10-16 NOTE — Plan of Care (Signed)
   Problem: Activity: Goal: Risk for activity intolerance will decrease Outcome: Progressing   Problem: Coping: Goal: Level of anxiety will decrease Outcome: Progressing   Problem: Pain Managment: Goal: General experience of comfort will improve and/or be controlled Outcome: Progressing

## 2023-10-16 NOTE — Plan of Care (Signed)
   Problem: Nutrition: Goal: Adequate nutrition will be maintained Outcome: Progressing   Problem: Coping: Goal: Level of anxiety will decrease Outcome: Progressing   Problem: Elimination: Goal: Will not experience complications related to bowel motility Outcome: Progressing Goal: Will not experience complications related to urinary retention Outcome: Progressing

## 2023-10-16 NOTE — Progress Notes (Signed)
 OT Cancellation Note  Patient Details Name: Larry Lutz MRN: [id number] DOB: [birth year]   Cancelled Treatment:    Reason Eval/Treat Not Completed: Patient declined, no reason specified (Pt politely declined OT eval due to pain. Pt was educated in importance of OOB activity and sitting upright with pt verbalizing understanding and requesting to be seen at a later time. OT to reattempt at a later time as appropriate/available.)  Margarie Rockey HERO., OTR/L, MA Acute Rehab [phone]   [name] 10/16/2023, 2:21 PM

## 2023-10-16 NOTE — Clinical Note (Incomplete)
 SABRA

## 2023-10-16 NOTE — Progress Notes (Signed)
 PT Cancellation Note  Patient Details Name: Larry Lutz MRN: [id number] DOB: 03/10/1991   Cancelled Treatment:    Reason Eval/Treat Not Completed: Patient at procedure or test/unavailable;Fatigue/lethargy limiting ability to participate;Pain limiting ability to participate (PT consult appreciated and chart reviewed. Pt politely refused evaluation citing fatigue and pain. Will follow-up as schedule permits.)  Randall SAUNDERS, PT, DPT Acute Rehabilitation Services Office: [phone] Secure Chat Preferred  [name] 10/16/2023, 2:19 PM

## 2023-10-16 NOTE — Progress Notes (Signed)
 HD#1 SUBJECTIVE:  Patient Summary: Larry Lutz is a 33 y.o. person living with a history of HIV who presented with painful bumps on legs and admitted for cellulitis and leukocytosis.   Overnight Events: none  Interim History: Patient still in pain today, he still has pain when trying to ambulate. The spot on his right scalp started to hurt him a lot last night, so much so that he couldn't sleep with his head turned to that side.  He does have an appetite today and has been able to eat some. He denies fevers or sweating but has felt chilly over night.   OBJECTIVE:  Vital Signs: Vitals:   10/15/23 1414 10/15/23 1654 10/15/23 1943 10/16/23 0422  BP: (!) 155/106 (!) 141/78 (!) 156/95 135/89  Pulse: 93 97 (!) 103 91  Resp:   17 17  Temp: 98 F (36.7 C) 98.7 F (37.1 C) 100 F (37.8 C) 98.4 F (36.9 C)  TempSrc: Oral Oral    SpO2: 100% 100% 100% 100%  Weight:      Height:       Supplemental O2: Room Air SpO2: 100 %  Filed Weights   10/15/23 0239  Weight: 70.3 kg     Intake/Output Summary (Last 24 hours) at 10/16/2023 0740 Last data filed at 10/16/2023 0400 Gross per 24 hour  Intake 200 ml  Output 900 ml  Net -700 ml   Net IO Since Admission: -700 mL [10/16/23 0740]  Physical Exam: Physical Exam Constitutional:      General: He is not in acute distress.    Appearance: Normal appearance. He is not ill-appearing or toxic-appearing.  HENT:     Head:     Comments: Bandaid on right parietal scalp    Nose: No congestion or rhinorrhea.     Mouth/Throat:     Mouth: Mucous membranes are moist.     Pharynx: Oropharynx is clear.  Eyes:     Extraocular Movements: Extraocular movements intact.     Conjunctiva/sclera: Conjunctivae normal.  Cardiovascular:     Heart sounds: Normal heart sounds.  Pulmonary:     Effort: Pulmonary effort is normal.     Breath sounds: Normal breath sounds.  Abdominal:     General: Abdomen is flat.     Palpations: Abdomen is soft.   Musculoskeletal:        General: Swelling present.     Comments: Lesions in groin, anterior thigh, PL thigh, and lower anterior leg have bandages on them. Purulent fluid expressed from anterior thigh for culture  Right thigh and anterior lower leg with mild induration around lesions, warm to touch    Neurological:     General: No focal deficit present.     Mental Status: He is alert and oriented to person, place, and time. Mental status is at baseline.  Psychiatric:        Mood and Affect: Mood normal.   Photos taken 8/2 in ED     Patient Lines/Drains/Airways Status     Active Line/Drains/Airways     Name Placement date Placement time Site Days   Peripheral IV 10/15/23 20 G 1.88 Anterior;Left Forearm 10/15/23  0808  Forearm  1            Pertinent labs and imaging:      Latest Ref Rng & Units 10/16/2023    6:24 AM 10/15/2023    3:26 AM 07/12/2023    2:55 PM  CBC  WBC 4.0 - 10.5 K/uL 10.2  12.2  6.2   Hemoglobin 13.0 - 17.0 g/dL 89.1  89.2  88.2   Hematocrit 39.0 - 52.0 % 35.2  35.9  37.3   Platelets 150 - 400 K/uL 382  366  398        Latest Ref Rng & Units 10/15/2023    3:26 AM 07/12/2023    2:55 PM 06/15/2021   11:30 AM  CMP  Glucose 70 - 99 mg/dL 94  58  77   BUN 6 - 20 mg/dL 18  21  11    Creatinine 0.61 - 1.24 mg/dL 8.85  8.92  8.97   Sodium 135 - 145 mmol/L 140  140  138   Potassium 3.5 - 5.1 mmol/L 3.8  3.9  4.6   Chloride 98 - 111 mmol/L 106  103  101   CO2 22 - 32 mmol/L 23  30  27    Calcium 8.9 - 10.3 mg/dL 9.1  89.7  9.8   Total Protein 6.1 - 8.1 g/dL  8.8  9.1   Total Bilirubin 0.2 - 1.2 mg/dL  0.4  0.4   AST 10 - 40 U/L  22  16   ALT 9 - 46 U/L  13  12     CT EXTREMITY LOWER RIGHT W CONTRAST Result Date: 10/15/2023 EXAM: CT RIGHT LOWER EXTREMITY, WITH IV CONTRAST 10/15/2023 09:18:06 AM TECHNIQUE: Axial images were acquired through the right lower extremity with IV contrast. Reformatted images were reviewed. Automated exposure control, iterative  reconstruction, and/or weight based adjustment of the mA/kV was utilized to reduce the radiation dose to as low as reasonably achievable. COMPARISON: None available. CLINICAL HISTORY: Soft tissue infection suspected, lower leg, xray done. Table formatting from the original note was not included. PT BIB PD from [facility] jail for a multiple abscess along his right leg. PT states it began 2 days ago. Pt states he believes he was bit by a spider on his Rt arm about a week ago. FINDINGS: BONES AND JOINTS: No acute fracture or focal osseous lesion. No dislocation. The joint spaces are normal. SOFT TISSUES: Mildly enlarged right external iliac and right inguinal lymph nodes are identified. The largest is in the right inguinal region measuring 1.4 cm, image 80/4. Subcutaneous soft tissue stranding with overlying skin thickening is noted extending along the anterior and medial aspect of the thigh to the level of the midshaft of the right femur axial images 90/4 through axial image 172/4. A focal area of skin thickening within the proximal aspect of the anterior medial thigh is noted, measuring 9 mm in thickness, image 17/4. No focal fluid collection is identified within this area. Skin thickening and subcutaneous edema is noted extending along the lateral aspect of the right thigh, image 149/4. No soft tissue gas identified along the length of the right lower extremity. There is diffuse subcutaneous soft tissue edema involving the right lower leg beginning at the level of the tibial tubercle and extending into the medial ankle. IMPRESSION: 1. Diffuse subcutaneous soft tissue edema involving the right lower leg, beginning at the level of the tibial tubercle and extending into the medial ankle. Findings compatible with cellulitis. No abscess identified. 2. Subcutaneous soft tissue stranding with overlying skin thickening along the anterior and medial aspect of the thigh to the level of the midshaft of the right femur, with a  focal area of skin thickening within the proximal aspect of the anterior medial thigh measuring 9 mm in thickness. No focal fluid collection identified within  this area. Imaging findings are favored to represent cellulitis. 3. Signs of cellulitis also noted involving the posterolateral aspect of the right thigh 4. Mildly enlarged right external iliac and right inguinal lymph nodes, with the largest in the right inguinal region measuring 1.4 cm. Favor reactive adenopathy 5. No acute osseous findings. Electronically signed by: Waddell Calk MD 10/15/2023 09:49 AM EDT RP Workstation: [id number]    ASSESSMENT/PLAN:  Assessment: Principal Problem:   Folliculitis Active Problems:   Ulcerative colitis (HCC)   Cellulitis of right lower extremity   AIDS (HCC)   Leukocytosis   Microcytic anemia   Plan: Tadhg Eskew is a 33 y.o. person living with a history of HIV who presented with painful bumps on legs and admitted for cellulitis and leukocytosis on hospital day 1   #Right groin, thigh, and leg cellulitis #Inguinal LAD #Furuncle CT in ED indicates cellulitis on lower leg, anterior thigh, posterolateral thigh as well as iliac and inguinal LAD with no signs of abscess or osseous change. Patient presented in pain with gentle palpation and difficulty ambulating.  -IV vanc 1500 mg loading, 1000 mg maintenance. Plan for one more day of IV vanc, then d/c on PO linezolid   -Blood cultures taken in ED, NG < 12hr. Lesion cultures taken today, pending -PT/OT ordered  -acetaminophen  650 mg, oxycodone  10 mg    #Leukocytosis, likely infectious 12.2 on admission. Patient reported having fevers although afebrile on admission.  -Trend WBC, monitor blood cultures - 10.2 on 8/3, afebrile   #HIV on Biktarvy  Patient last saw ID on 07/12/23, per note, patient immune status improving with weight gain and reports improved adherence to therapy. Patient had folliculitis on the BL forearms at that time and was prescribed  doxy for 2 weeks. HIV RNA 330, CD4 157, RPR nonreactive -Continue Biktarvy  and Bactrim  -HIV RNA and CD4 ordered   #Anxiety and itching ID on 07/12/23 prescribed hydroxyzine  for anxiety.  -Continue hydroxyzine  10 mg prn    Best Practice: Diet: Regular diet VTE: enoxaparin  (LOVENOX ) injection 40 mg Start: 10/15/23 2200 Code: Full  Disposition planning: Therapy Recs: None, DME: none DISPO: Anticipated discharge tomorrow to Home pending IV antibiotics.  Signature:  Viktoria Charmayne [facility]  7:40 AM, 10/16/2023  On Call pager [phone]

## 2023-10-17 LAB — T-HELPER CELLS (CD4) COUNT (NOT AT ARMC)
CD4 % Helper T Cell: 4 % — ABNORMAL LOW (ref 33–65)
CD4 T Cell Abs: 73 /uL — ABNORMAL LOW (ref 400–1790)

## 2023-10-17 LAB — GC/CHLAMYDIA PROBE AMP (~~LOC~~) NOT AT ARMC
Chlamydia: NEGATIVE
Comment: NEGATIVE
Comment: NORMAL
Neisseria Gonorrhea: NEGATIVE

## 2023-10-17 LAB — HIV-1 RNA QUANT-NO REFLEX-BLD
HIV 1 RNA Quant: 3250 {copies}/mL
LOG10 HIV-1 RNA: 3.512 {Log_copies}/mL

## 2023-10-17 MED ORDER — CHLORHEXIDINE GLUCONATE CLOTH 2 % EX PADS
6.0000 | MEDICATED_PAD | Freq: Every day | CUTANEOUS | Status: DC
  Administered 2023-10-17 – 2023-10-18 (×2): 6 via TOPICAL

## 2023-10-17 MED ORDER — LINEZOLID 600 MG PO TABS
600.0000 mg | ORAL_TABLET | Freq: Two times a day (BID) | ORAL | Status: DC
  Administered 2023-10-17 – 2023-10-18 (×3): 600 mg via ORAL
  Filled 2023-10-17 (×4): qty 1

## 2023-10-17 MED ORDER — ONDANSETRON HCL 4 MG PO TABS
4.0000 mg | ORAL_TABLET | Freq: Three times a day (TID) | ORAL | Status: DC | PRN
  Administered 2023-10-18: 4 mg via ORAL
  Filled 2023-10-17: qty 1

## 2023-10-17 NOTE — Evaluation (Signed)
 Occupational Therapy Evaluation Patient Details Name: Larry Lutz MRN: [id number] DOB: 11/30/1990 Today's Date: 10/17/2023   History of Present Illness   Pt is a 33 y/o M presenting to ED on 8/2 with abscesses and RLE edema, admitted for folliculitis. PMH includes AIDS, HPV, ulcerative colitis.     Clinical Impressions Pt reports ind at baseline with ADLs/functional mobility, lives alone in a first floor hotel room. Pt currently needing up to min A for ADLs, mod I for bed mobility and CGA for transfers with RW. Pt with painful RLE and itching body throughout session. Pt with incr with RLE weightbearing as he mobilizes. Pt presenting with impairments listed below, will follow acutely. Anticipate no OT follow up needs at d/c.      If plan is discharge home, recommend the following:   A little help with walking and/or transfers;A little help with bathing/dressing/bathroom;Assistance with cooking/housework;Direct supervision/assist for financial management;Direct supervision/assist for medications management;Assist for transportation;Help with stairs or ramp for entrance;Supervision due to cognitive status     Functional Status Assessment   Patient has had a recent decline in their functional status and demonstrates the ability to make significant improvements in function in a reasonable and predictable amount of time.     Equipment Recommendations   None recommended by OT     Recommendations for Other Services   PT consult     Precautions/Restrictions   Precautions Precautions: Fall Restrictions Weight Bearing Restrictions Per Provider Order: No     Mobility Bed Mobility Overal bed mobility: Modified Independent                  Transfers Overall transfer level: Needs assistance Equipment used: Rolling walker (2 wheels) Transfers: Sit to/from Stand Sit to Stand: Contact guard assist                  Balance Overall balance assessment: Needs  assistance Sitting-balance support: Feet supported Sitting balance-Leahy Scale: Good     Standing balance support: During functional activity, Reliant on assistive device for balance Standing balance-Leahy Scale: Fair Standing balance comment: static stands without AD, reliant on RW for ambulation                           ADL either performed or assessed with clinical judgement   ADL Overall ADL's : Needs assistance/impaired Eating/Feeding: Set up;Sitting   Grooming: Set up;Sitting;Standing   Upper Body Bathing: Contact guard assist;Sitting;Standing   Lower Body Bathing: Minimal assistance;Sitting/lateral leans;Sit to/from stand   Upper Body Dressing : Contact guard assist;Sitting;Standing   Lower Body Dressing: Minimal assistance;Sitting/lateral leans;Sit to/from stand   Toilet Transfer: Contact guard assist;Ambulation;Rolling walker (2 wheels);Regular Toilet   Toileting- Clothing Manipulation and Hygiene: Contact guard assist       Functional mobility during ADLs: Contact guard assist;Rolling walker (2 wheels)       Vision   Vision Assessment?: No apparent visual deficits     Perception Perception: Not tested       Praxis Praxis: Not tested       Pertinent Vitals/Pain Pain Assessment Pain Assessment: Faces Pain Score: 9  Faces Pain Scale: Hurts whole lot Pain Location: BLE Pain Descriptors / Indicators: Discomfort Pain Intervention(s): Limited activity within patient's tolerance, Monitored during session, Repositioned     Extremity/Trunk Assessment Upper Extremity Assessment Upper Extremity Assessment: Overall WFL for tasks assessed   Lower Extremity Assessment Lower Extremity Assessment: Defer to PT evaluation   Cervical / Trunk  Assessment Cervical / Trunk Assessment: Normal   Communication Communication Communication: No apparent difficulties   Cognition Arousal: Alert Behavior During Therapy: WFL for tasks  assessed/performed Cognition: No apparent impairments                               Following commands: Intact       Cueing  General Comments   Cueing Techniques: Verbal cues  VSS   Exercises     Shoulder Instructions      Home Living Family/patient expects to be discharged to:: Other (Comment) (prison, maybe then hotel?)                                 Additional Comments: was living in a first floor hotel room with tub shower, does not have DME at home, was ind, living alone      Prior Functioning/Environment Prior Level of Function : Independent/Modified Independent                    OT Problem List: Decreased range of motion;Decreased strength;Decreased activity tolerance;Impaired balance (sitting and/or standing);Pain   OT Treatment/Interventions: Therapeutic exercise;Self-care/ADL training;Energy conservation;DME and/or AE instruction;Therapeutic activities;Balance training;Patient/family education      OT Goals(Current goals can be found in the care plan section)   Acute Rehab OT Goals Patient Stated Goal: none stated OT Goal Formulation: With patient Time For Goal Achievement: 10/31/23 Potential to Achieve Goals: Good ADL Goals Pt Will Perform Lower Body Dressing: Independently;sitting/lateral leans;sit to/from stand Pt Will Transfer to Toilet: Independently;regular height toilet;ambulating Pt Will Perform Tub/Shower Transfer: Tub transfer;Shower transfer;Independently;ambulating   OT Frequency:  Min 1X/week    Co-evaluation              AM-PAC OT 6 Clicks Daily Activity     Outcome Measure Help from another person eating meals?: None Help from another person taking care of personal grooming?: A Little Help from another person toileting, which includes using toliet, bedpan, or urinal?: A Little Help from another person bathing (including washing, rinsing, drying)?: A Little Help from another person to put on  and taking off regular upper body clothing?: A Little Help from another person to put on and taking off regular lower body clothing?: A Little 6 Click Score: 19   End of Session Equipment Utilized During Treatment: Rolling walker (2 wheels) Nurse Communication: Mobility status  Activity Tolerance: Patient tolerated treatment well Patient left: in bed;with call bell/phone within reach;Other (comment) (officer present)  OT Visit Diagnosis: Unsteadiness on feet (R26.81);Other abnormalities of gait and mobility (R26.89);Muscle weakness (generalized) (M62.81)                Time: 9144-9090 OT Time Calculation (min): 14 min Charges:  OT General Charges $OT Visit: 1 Visit OT Evaluation $OT Eval Low Complexity: 1 Low  Anastasio Wogan K, OTD, OTR/L SecureChat Preferred Acute Rehab [phone]   Gaelan Glennon K Koonce 10/17/2023, 10:06 AM

## 2023-10-17 NOTE — Plan of Care (Signed)

## 2023-10-17 NOTE — Progress Notes (Signed)
 HD#2 SUBJECTIVE:  Patient Summary: Larry Lutz is a 33 y.o. with a pertinent PMH of HIV and UC, who presented with painful right leg lesions and admitted for cellulitis and leukocytosis.   Overnight Events: none  Interim History: Patient eating breakfast this morning and says his appetite is improving. He did feel nauseous while speaking with us  and felt like he was going to vomit but he did not. Notes feeling dizzy and lightheaded. Discussed this may be due to the medicine.  He slept better last night but head still causing pain and he thinks might be making him have nightmares. He had diarrhea last night. He still doesn't feel great and would like to stay another day.  He was able to walk better with PT today.  Discussed that we will transition him to PO abx today in preparation for discharge tomorrow.   OBJECTIVE:  Vital Signs: Vitals:   10/16/23 1521 10/16/23 1946 10/16/23 2203 10/17/23 0446  BP: 127/78 (!) 136/91 (!) 154/97 (!) 140/84  Pulse: 74 93 95 90  Resp: 16 18 18 16   Temp: (!) 97.3 F (36.3 C) 98.1 F (36.7 C) 98.6 F (37 C) 98 F (36.7 C)  TempSrc:  Oral Oral Oral  SpO2: 99% 99% 100% 94%  Weight:      Height:       Supplemental O2: Room Air SpO2: 94 %  Filed Weights   10/15/23 0239  Weight: 70.3 kg     Intake/Output Summary (Last 24 hours) at 10/17/2023 0612 Last data filed at 10/16/2023 1700 Gross per 24 hour  Intake 680 ml  Output 550 ml  Net 130 ml   Net IO Since Admission: -570 mL [10/17/23 0612]  Physical Exam: Physical Exam Vitals reviewed.  Constitutional:      Appearance: Normal appearance. He is not toxic-appearing or diaphoretic.  HENT:     Nose: No congestion or rhinorrhea.     Mouth/Throat:     Pharynx: Oropharynx is clear.  Eyes:     Conjunctiva/sclera: Conjunctivae normal.  Cardiovascular:     Rate and Rhythm: Regular rhythm. Tachycardia present.  Pulmonary:     Effort: Pulmonary effort is normal.     Breath sounds: Normal breath  sounds. No wheezing.  Skin:    Comments: Lesions mostly unchanged, not as warm, patient in less pain with light touch L and R lower anterior leg lesions with surrounding induration R anterior, posterolateral thigh and right groin lesions covered with bandaid, able to see hemorrhagic drainage  Neurological:     General: No focal deficit present.     Mental Status: He is alert and oriented to person, place, and time.  Psychiatric:        Mood and Affect: Mood normal.     Patient Lines/Drains/Airways Status     Active Line/Drains/Airways     Name Placement date Placement time Site Days   Peripheral IV 10/15/23 20 G 1.88 Anterior;Left Forearm 10/15/23  0808  Forearm  2            Pertinent labs and imaging:  Blood cultures taken in ED, NG 1d. Lesion cultures 8/3, stain: GPC    Latest Ref Rng & Units 10/16/2023    6:24 AM 10/15/2023    3:26 AM 07/12/2023    2:55 PM  CBC  WBC 4.0 - 10.5 K/uL 10.2  12.2  6.2   Hemoglobin 13.0 - 17.0 g/dL 89.1  89.2  88.2   Hematocrit 39.0 - 52.0 % 35.2  35.9  37.3   Platelets 150 - 400 K/uL 382  366  398        Latest Ref Rng & Units 10/16/2023    6:24 AM 10/15/2023    3:26 AM 07/12/2023    2:55 PM  CMP  Glucose 70 - 99 mg/dL 99  94  58   BUN 6 - 20 mg/dL 16  18  21    Creatinine 0.61 - 1.24 mg/dL 8.88  8.85  8.92   Sodium 135 - 145 mmol/L 135  140  140   Potassium 3.5 - 5.1 mmol/L 3.5  3.8  3.9   Chloride 98 - 111 mmol/L 100  106  103   CO2 22 - 32 mmol/L 25  23  30    Calcium 8.9 - 10.3 mg/dL 8.5  9.1  89.7   Total Protein 6.1 - 8.1 g/dL   8.8   Total Bilirubin 0.2 - 1.2 mg/dL   0.4   AST 10 - 40 U/L   22   ALT 9 - 46 U/L   13     No results found.  ASSESSMENT/PLAN:  Assessment: Principal Problem:   Folliculitis Active Problems:   Ulcerative colitis (HCC)   HIV infection (HCC)   Cellulitis of right lower extremity   AIDS (HCC)   Leukocytosis   Microcytic anemia   Plan: Larry Lutz is a 33 y.o. person living with a history of  HIV who presented with painful bumps on legs and admitted for cellulitis and leukocytosis on hospital day 1   #Right groin, thigh, and leg cellulitis #Inguinal LAD #Furuncle CT in ED indicates cellulitis on lower leg, anterior thigh, posterolateral thigh as well as iliac and inguinal LAD with no signs of abscess or osseous change. Patient presented in pain with gentle palpation and difficulty ambulating. Patient ambulated well with PT today.  -IV vanc 1500 mg loading, 1000 mg maintenance. 8/2-3 IV vanc, 8/4 transition to 600 mg PO linezolid  with plan to d/c tomorrow -chlorhexidine  cloths and CHG bathing ordered  -Blood cultures taken in ED, NG 2d. Lesion cultures 8/3 stain: GPC -acetaminophen  650 mg, oxycodone  10 mg, ondansetron  4 mg   #Leukocytosis, likely infectious 12.2 on admission. Patient has been afebrile throughout hospital stay.  - monitor blood cultures - 10.2 on 8/3, f/u CBC tomorrow   #HIV on Biktarvy  Patient last saw ID on 07/12/23, per note, patient immune status improving with weight gain and reports improved adherence to therapy. Patient had folliculitis on the BL forearms at that time and was prescribed doxy for 2 weeks. HIV RNA 330, CD4 157, RPR nonreactive -Continue Biktarvy  and Bactrim  -HIV RNA and CD4 ordered   #Anxiety and itching ID on 07/12/23 prescribed hydroxyzine  for anxiety.  -Continue hydroxyzine  10 mg prn  Best Practice: Diet: Regular diet VTE: enoxaparin  (LOVENOX ) injection 40 mg Start: 10/15/23 2200 Code: Full  Disposition planning: Therapy Recs: None, DME: none Family Contact: Partner in jail DISPO: Anticipated discharge tomorrow to Home pending resolution of symptoms.  Signature:  Viktoria Charmayne [facility]  6:12 AM, 10/17/2023  On Call pager [phone]

## 2023-10-17 NOTE — Evaluation (Signed)
 Physical Therapy Evaluation Patient Details Name: Larry Lutz MRN: [id number] DOB: [birth year] Today's Date: 10/17/2023  History of Present Illness  Pt is a 33 y/o M presenting to ED on 8/2 with abscesses and RLE edema, admitted for folliculitis. PMH includes AIDS, HPV, ulcerative colitis.  Clinical Impression  Pt admitted with above diagnosis. Pt lives alone in a hotel, reports independence. Pt currently having difficulty putting wt RLE due to pain, using RW to offset wt but hopeful that as pain decreases he will not need AD. Will ask mobility team to mobilize with pt and will check back end of week to make sure he continues to have full ROM and is able to put more wt on R side.  Pt currently with functional limitations due to the deficits listed below (see PT Problem List). Pt will benefit from acute skilled PT to increase their independence and safety with mobility to allow discharge.           If plan is discharge home, recommend the following: A little help with walking and/or transfers;Help with stairs or ramp for entrance;Assist for transportation   Can travel by private vehicle        Equipment Recommendations Rolling walker (2 wheels)  Recommendations for Other Services       Functional Status Assessment Patient has had a recent decline in their functional status and demonstrates the ability to make significant improvements in function in a reasonable and predictable amount of time.     Precautions / Restrictions Precautions Precautions: Fall Restrictions Weight Bearing Restrictions Per Provider Order: No      Mobility  Bed Mobility Overal bed mobility: Modified Independent                  Transfers Overall transfer level: Needs assistance Equipment used: Rolling walker (2 wheels) Transfers: Sit to/from Stand Sit to Stand: Contact guard assist                Ambulation/Gait Ambulation/Gait assistance: Contact guard assist Gait Distance (Feet): 250  Feet Assistive device: Rolling walker (2 wheels) Gait Pattern/deviations: Step-to pattern, Decreased weight shift to right, Decreased stance time - right Gait velocity: decreased Gait velocity interpretation: 1.31 - 2.62 ft/sec, indicative of limited community ambulator   General Gait Details: pt began ambulation with no wt on RLE but progressively was able to put wt on R side and then get R heel to floor. Pt ambulates with antalgic pattern and is reliant on RW at this time to take wt off R side  Stairs            Wheelchair Mobility     Tilt Bed    Modified Rankin (Stroke Patients Only)       Balance Overall balance assessment: Needs assistance Sitting-balance support: Feet supported Sitting balance-Leahy Scale: Good     Standing balance support: During functional activity, Reliant on assistive device for balance Standing balance-Leahy Scale: Fair Standing balance comment: static stands without AD, reliant on RW for ambulation                             Pertinent Vitals/Pain Pain Assessment Pain Assessment: Faces Faces Pain Scale: Hurts whole lot Pain Location: BLE Pain Descriptors / Indicators: Discomfort Pain Intervention(s): Limited activity within patient's tolerance, Monitored during session, Premedicated before session    Home Living Family/patient expects to be discharged to:: Other (Comment) (prison, maybe then hotel?)  Additional Comments: was living in a first floor hotel room with tub shower, does not have DME at home, was ind, living alone    Prior Function Prior Level of Function : Independent/Modified Independent                     Extremity/Trunk Assessment   Upper Extremity Assessment Upper Extremity Assessment: Defer to OT evaluation    Lower Extremity Assessment Lower Extremity Assessment: RLE deficits/detail RLE Deficits / Details: difficult to assess due to pain, ROM WFL, was able to bear  increasing wt RLE with ambulation but WB'ing is painful RLE: Unable to fully assess due to pain RLE Sensation: WNL RLE Coordination: WNL    Cervical / Trunk Assessment Cervical / Trunk Assessment: Normal  Communication   Communication Communication: No apparent difficulties    Cognition Arousal: Alert Behavior During Therapy: WFL for tasks assessed/performed   PT - Cognitive impairments: No apparent impairments                         Following commands: Intact       Cueing Cueing Techniques: Verbal cues     General Comments General comments (skin integrity, edema, etc.): VSS.    Exercises Other Exercises Other Exercises: reviewed ROM exercises for RLE to prevent stiffness and preserve current FROM   Assessment/Plan    PT Assessment Patient needs continued PT services  PT Problem List Decreased mobility;Decreased activity tolerance;Pain       PT Treatment Interventions DME instruction;Gait training;Stair training;Functional mobility training;Therapeutic activities;Therapeutic exercise;Patient/family education    PT Goals (Current goals can be found in the Care Plan section)  Acute Rehab PT Goals Patient Stated Goal: decreased pain PT Goal Formulation: With patient Time For Goal Achievement: 10/31/23 Potential to Achieve Goals: Good    Frequency Min 1X/week     Co-evaluation               AM-PAC PT 6 Clicks Mobility  Outcome Measure Help needed turning from your back to your side while in a flat bed without using bedrails?: None Help needed moving from lying on your back to sitting on the side of a flat bed without using bedrails?: None Help needed moving to and from a bed to a chair (including a wheelchair)?: None Help needed standing up from a chair using your arms (e.g., wheelchair or bedside chair)?: A Little Help needed to walk in hospital room?: A Little Help needed climbing 3-5 steps with a railing? : A Lot 6 Click Score: 20     End of Session   Activity Tolerance: Patient tolerated treatment well Patient left: in bed;with call bell/phone within reach;Other (comment) Water quality scientist present) Nurse Communication: Mobility status PT Visit Diagnosis: Unsteadiness on feet (R26.81);Pain Pain - Right/Left: Right Pain - part of body: Leg    Time: 9154-9142 PT Time Calculation (min) (ACUTE ONLY): 12 min   Charges:   PT Evaluation $PT Eval Low Complexity: 1 Low   PT General Charges $$ ACUTE PT VISIT: 1 Visit         Richerd Lipoma, PT  Acute Rehab Services Secure chat preferred Office [phone]   Richerd CROME Perri Lamagna 10/17/2023, 12:26 PM

## 2023-10-18 ENCOUNTER — Other Ambulatory Visit (HOSPITAL_COMMUNITY): Payer: Self-pay

## 2023-10-18 MED ORDER — PROCHLORPERAZINE EDISYLATE 10 MG/2ML IJ SOLN: 10.0000 mg | Freq: Once | INTRAMUSCULAR | Status: DC

## 2023-10-18 MED ORDER — LINEZOLID 600 MG PO TABS
600.0000 mg | ORAL_TABLET | Freq: Two times a day (BID) | ORAL | 0 refills | Status: AC
  Filled 2023-10-18: qty 14, 7d supply, fill #0

## 2023-10-18 MED ORDER — HYDROXYZINE HCL 10 MG PO TABS
10.0000 mg | ORAL_TABLET | Freq: Three times a day (TID) | ORAL | 0 refills | Status: AC | PRN
  Filled 2023-10-18: qty 30, 10d supply, fill #0

## 2023-10-18 MED ORDER — LINEZOLID 600 MG PO TABS
600.0000 mg | ORAL_TABLET | Freq: Two times a day (BID) | ORAL | 0 refills | Status: DC
Start: 2023-10-18 — End: 2023-10-18
  Filled 2023-10-18: qty 15, 8d supply, fill #0

## 2023-10-18 MED ORDER — DIPHENHYDRAMINE HCL 25 MG PO CAPS
25.0000 mg | ORAL_CAPSULE | Freq: Once | ORAL | Status: AC
  Administered 2023-10-18: 25 mg via ORAL
  Filled 2023-10-18: qty 1

## 2023-10-18 MED ORDER — SULFAMETHOXAZOLE-TRIMETHOPRIM 800-160 MG PO TABS
1.0000 | ORAL_TABLET | ORAL | 0 refills | Status: DC
  Filled 2023-10-18: qty 12, 28d supply, fill #0

## 2023-10-18 NOTE — Progress Notes (Signed)
 Mobility Specialist Progress Note:   10/18/23 1100  Mobility  Activity Refused and notified nurse if applicable   Pt refused mobility pt stating he just fell asleep and needs to get head right for possible discharge. Despite encouragement pt still refused. Will f/u as able.    Thersia Minder Mobility Specialist  Please contact vis Secure Chat or  Rehab Office [phone]

## 2023-10-18 NOTE — Discharge Summary (Signed)
 Name: Larry Lutz MRN: [id number] DOB: November 04, 1990 33 y.o. PCP: Patient, No Pcp Per  Date of Admission: 10/15/2023  2:11 AM Date of Discharge: 10/18/2023 Attending Physician: Dr. Reyes Fenton  Discharge Diagnosis: 1. Principal Problem:   Folliculitis Active Problems:   Ulcerative colitis (HCC)   HIV infection (HCC)   Cellulitis of right lower extremity   AIDS (HCC)   Leukocytosis   Microcytic anemia    Discharge Medications: Allergies as of 10/18/2023   No Known Allergies      Medication List     TAKE these medications    bictegravir-emtricitabine -tenofovir  AF 50-200-25 MG Tabs tablet Commonly known as: BIKTARVY  Take 1 tablet by mouth daily. Try to take at the same time each day with or without food.   hydrOXYzine  10 MG tablet Commonly known as: ATARAX  Take 1 tablet (10 mg total) by mouth 3 (three) times daily as needed for itching or anxiety.   linezolid  600 MG tablet Commonly known as: ZYVOX  Take 1 tablet (600 mg total) by mouth every 12 (twelve) hours for 7 days.   sulfamethoxazole -trimethoprim  800-160 MG tablet Commonly known as: BACTRIM  DS Take 1 tablet by mouth 3 (three) times a week. Start taking on: October 19, 2023        Disposition and follow-up:   Mr.[name] was discharged from [facility] in Good condition.  At the hospital follow up visit please address:  1.  Dr. Tobie, PGY3 with IM teaching service at [facility], had conversation with Nat (director of nursing) to ensure patient will have access to his antibiotic and HIV medicine, as well as transportation to follow up ID appointment.   -Folliculitis: Ensure the lesions on his right and left leg, right groin, peri-anal, and right scalp have healed appropriately. Medical director to assess wounds on intake to give wound care instructions.  -HIV: Ensure patient is taking Biktarvy  every day Bactrim  three times a week.  Ensure patient has follow up appointment with IDA as the  appointment has not been made. Phone number provided on this discharge summary.   2.  Labs / imaging needed at time of follow-up: none  3.  Pending labs/ test needing follow-up: Blood cultures on 8/2 pending, no growth as of 3 days. Pustule culture on 8/3 growing MRSA, pending final results.   Follow-up Appointments:  Follow-up Information     [name], NP. Call.   Specialty: Infectious Diseases Why: to make hospital follow up appointment Contact information: [phone] [place] [address] [phone]                  Hospital Course by problem list: Larry Lutz is a 33 y.o. person living with a history of uncontrolled HIV and UC who presented with right leg pain and abscesses and admitted for cellulitis  now being discharged on hospital day 3 with the following pertinent hospital course:   #Right groin, thigh, and leg cellulitis #Inguinal LAD #Furuncle Scattered pustules on right leg, one on anterior lower left leg. Two nodular lesions in groin. CT in ED indicates cellulitis on lower leg, anterior thigh, posterolateral thigh as well as iliac and inguinal LAD with no signs of abscess or osseous change. Patient presented in pain with gentle palpation and difficulty ambulating. Patient was ambulating well with PT on hospital day 2.   Chlorhexidine  cloths and CHG bath given on 8/4. IV vancomycin  given 8/2 and on 8/3. Switched to PO Linezolid  600 mg twice daily on 8/4 with  plans to complete 7 more days, EOT 8/11. Blood cultures taken in ED, pending results, no growth at 3 days. Lesion cultures 8/3 methicillin resistant staph aureus, results pending. Acetaminophen  650 mg, oxycodone  10 mg, ondansetron  4 mg as needed for pain and nausea. Given diphenhydramine  and prochlorperazine  as one time dose for headache pain on 8/5.    #Leukocytosis Self reported fever on admission. WBC 12.2 on admission, 10.2 on 8/3. Patient denied follow up CBC on day of discharge. Patient has been  afebrile throughout hospital stay. GC, chlamydia and RPR negative.    #HIV on Biktarvy  Patient last saw ID on 07/12/23, per note, patient immune status improving with weight gain and reports improved adherence to therapy. Patient had folliculitis on the BL forearms at that time and was prescribed doxy for 2 weeks. At that time, HIV RNA 330, CD4 157, RPR nonreactive. Continued Biktarvy  and Bactrim  while in hospital. On 8/3, HIV RNA 3250, CD4 74. Will continue Biktary and Bactrim  on discharge. Follow up with ID.    #Anxiety and itching ID on 07/12/23 prescribed hydroxyzine  for anxiety. Patient was anxious and itching/picking the pustules while in the ED. Continued hydroxyzine  10 mg prn in the hospital. Will discharge him with it.   Subjective Patient is going to jail when he leaves the hospital. He seemed hesitant to leave hospital today because he's worried that he will slip through the cracks and not get the medicine he needs for his wounds. Discussed that we will ensure he is able to get his medicine and go to his follow up ID appointment.   Discharge Exam:   BP (!) 143/91 (BP Location: Right Arm)   Pulse 94   Temp 98.1 F (36.7 C) (Oral)   Resp 17   Ht 5' 7 (1.702 m)   Wt 70.3 kg   SpO2 97%   BMI 24.28 kg/m  Discharge exam:  Physical Exam Constitutional:      Appearance: He is not ill-appearing.  HENT:     Nose: No congestion.  Eyes:     Conjunctiva/sclera: Conjunctivae normal.  Pulmonary:     Effort: Pulmonary effort is normal.  Skin:    Comments: Wounds were bandaged with visible hemorrhagic draining beneath, but contained within, the bandage.   Neurological:     General: No focal deficit present.     Mental Status: He is alert and oriented to person, place, and time.   Pertinent Labs, Studies, and Procedures:     Latest Ref Rng & Units 10/16/2023    6:24 AM 10/15/2023    3:26 AM 07/12/2023    2:55 PM  CBC  WBC 4.0 - 10.5 K/uL 10.2  12.2  6.2   Hemoglobin 13.0 - 17.0  g/dL 89.1  89.2  88.2   Hematocrit 39.0 - 52.0 % 35.2  35.9  37.3   Platelets 150 - 400 K/uL 382  366  398        Latest Ref Rng & Units 10/16/2023    6:24 AM 10/15/2023    3:26 AM 07/12/2023    2:55 PM  CMP  Glucose 70 - 99 mg/dL 99  94  58   BUN 6 - 20 mg/dL 16  18  21    Creatinine 0.61 - 1.24 mg/dL 8.88  8.85  8.92   Sodium 135 - 145 mmol/L 135  140  140   Potassium 3.5 - 5.1 mmol/L 3.5  3.8  3.9   Chloride 98 - 111 mmol/L 100  106  103   CO2 22 - 32 mmol/L 25  23  30    Calcium 8.9 - 10.3 mg/dL 8.5  9.1  89.7   Total Protein 6.1 - 8.1 g/dL   8.8   Total Bilirubin 0.2 - 1.2 mg/dL   0.4   AST 10 - 40 U/L   22   ALT 9 - 46 U/L   13     CT EXTREMITY LOWER RIGHT W CONTRAST Result Date: 10/15/2023 EXAM: CT RIGHT LOWER EXTREMITY, WITH IV CONTRAST 10/15/2023 09:18:06 AM TECHNIQUE: Axial images were acquired through the right lower extremity with IV contrast. Reformatted images were reviewed. Automated exposure control, iterative reconstruction, and/or weight based adjustment of the mA/kV was utilized to reduce the radiation dose to as low as reasonably achievable. COMPARISON: None available. CLINICAL HISTORY: Soft tissue infection suspected, lower leg, xray done. Table formatting from the original note was not included. PT BIB PD from [facility] for a multiple abscess along his right leg. PT states it began 2 days ago. Pt states he believes he was bit by a spider on his Rt arm about a week ago. FINDINGS: BONES AND JOINTS: No acute fracture or focal osseous lesion. No dislocation. The joint spaces are normal. SOFT TISSUES: Mildly enlarged right external iliac and right inguinal lymph nodes are identified. The largest is in the right inguinal region measuring 1.4 cm, image 80/4. Subcutaneous soft tissue stranding with overlying skin thickening is noted extending along the anterior and medial aspect of the thigh to the level of the midshaft of the right femur axial images 90/4 through axial image  172/4. A focal area of skin thickening within the proximal aspect of the anterior medial thigh is noted, measuring 9 mm in thickness, image 17/4. No focal fluid collection is identified within this area. Skin thickening and subcutaneous edema is noted extending along the lateral aspect of the right thigh, image 149/4. No soft tissue gas identified along the length of the right lower extremity. There is diffuse subcutaneous soft tissue edema involving the right lower leg beginning at the level of the tibial tubercle and extending into the medial ankle. IMPRESSION: 1. Diffuse subcutaneous soft tissue edema involving the right lower leg, beginning at the level of the tibial tubercle and extending into the medial ankle. Findings compatible with cellulitis. No abscess identified. 2. Subcutaneous soft tissue stranding with overlying skin thickening along the anterior and medial aspect of the thigh to the level of the midshaft of the right femur, with a focal area of skin thickening within the proximal aspect of the anterior medial thigh measuring 9 mm in thickness. No focal fluid collection identified within this area. Imaging findings are favored to represent cellulitis. 3. Signs of cellulitis also noted involving the posterolateral aspect of the right thigh 4. Mildly enlarged right external iliac and right inguinal lymph nodes, with the largest in the right inguinal region measuring 1.4 cm. Favor reactive adenopathy 5. No acute osseous findings. Electronically signed by: Waddell Calk MD 10/15/2023 09:49 AM EDT RP Workstation: [id number]   DG Tibia/Fibula Right Result Date: 10/15/2023 EXAM: XR Tibia and fibula. 10/15/2023 03:17:31 AM COMPARISON: None available. CLINICAL HISTORY: Wound. FINDINGS: BONES AND JOINTS: No acute fracture. No focal osseous lesion. No joint dislocation. SOFT TISSUES: The soft tissues are unremarkable. IMPRESSION: 1. No significant abnormality. Electronically signed by: Franky Stanford MD  10/15/2023 03:23 AM EDT RP Workstation: [id number]   DG Forearm Right Result Date: 10/15/2023 EXAM: 1 VIEW XRAY OF THE RIGHT FOREARM 10/15/2023  03:17:31 AM COMPARISON: None available. CLINICAL HISTORY: Wound. FINDINGS: BONES AND JOINTS: No acute fracture. No focal osseous lesion. No joint dislocation. SOFT TISSUES: The soft tissues are unremarkable. IMPRESSION: 1. No significant abnormality. Electronically signed by: Franky Stanford MD 10/15/2023 03:19 AM EDT RP Workstation: [id number]     Discharge Instructions: Discharge Instructions     Call MD for:  redness, tenderness, or signs of infection (pain, swelling, redness, odor or green/yellow discharge around incision site)   Complete by: As directed    Call MD for:  severe uncontrolled pain   Complete by: As directed    Call MD for:  temperature >100.4   Complete by: As directed    Diet general   Complete by: As directed    Discharge instructions   Complete by: As directed    Thank you for allowing us  to be part of your care. You were hospitalized for folliculitis. We treated you with antibiotics.  See the changes in your medications and management of your chronic conditions below:  *For your folliculitis -We have STARTED you on these following medications:  - Linezolid , take one pill twice a day, until you run out of pills  *For your HIV -Continue taking:  -Biktarvy , one pill daily  -Bactrim , one pill three times a week  FOLLOW UP APPOINTMENTS:  Call ID clinic to make follow up appointment.   Please make sure to take all three of the medications as instructed.   Please call your PCP or our clinic if you have any questions or concerns, we may be able to help and keep you from a long and expensive emergency room wait. Our clinic and after hours phone number is [phone]. The best time to call is Monday through Friday 9 am to 4 pm but there is always someone available 24/7 if you have an emergency. If you need medication refills  please notify your pharmacy one week in advance and they will send us  a request.   We are glad you are feeling better,  Viktoria King Internal Medicine Inpatient Teaching Service at [facility]   Increase activity slowly   Complete by: As directed        Signed: King Viktoria, DO 10/18/2023, 2:35 PM

## 2023-10-18 NOTE — Plan of Care (Signed)

## 2023-10-18 NOTE — TOC Transition Note (Signed)
 Transition of Care [facility]) - Discharge Note   Patient Details  Name: Larry Lutz MRN: [id number] Date of Birth: 02/27/1991  [facility] CM/SW Contact:  Rosalva Jon Bloch, RN Phone Number: 10/18/2023, 10:36 AM   Clinical Narrative:    Patient will DC to: back to jail, [facility] Anticipated DC date: 10/18/2023 Transport by: car, police custody       Admitted for folliculitis. Hx of  AIDS, HPV, ulcerative colitis.  Per MD patient ready for DC today . RN, patient,and [address] ( medical/ INW:Snwbj [phone]) notified of DC. Once discharge summary ready, summary will be faxed to [facility] medical @  [phone].    Prescription Zyvox  to be filled by [facility] pharmacy to go with pt @ d/c. Med should be given to police offer. Police custody to accompany pt back to jail. Post hospital f/u noted on AVS.  RNCM will sign off for now as intervention is no longer needed. Please consult us  again if new needs arise.    Final next level of care: [facility]rections Facility Barriers to Discharge: No Barriers Identified   Patient Goals and CMS Choice            Discharge Placement                       Discharge Plan and Services Additional resources added to the After Visit Summary for                                       Social Drivers of Health (SDOH) Interventions SDOH Screenings   Food Insecurity: No Food Insecurity (10/15/2023)  Housing: Low Risk  (10/15/2023)  Transportation Needs: No Transportation Needs (10/15/2023)  Utilities: Not At Risk (10/15/2023)  Alcohol Screen: Low Risk  (01/23/2017)  Depression (PHQ2-9): Medium Risk (07/12/2022)  Social Connections: Unknown (06/26/2022)   Received from [facility]  Tobacco Use: High Risk (07/12/2023)     Readmission Risk Interventions     No data to display

## 2023-10-18 NOTE — Discharge Instructions (Addendum)
 Thank you for allowing us  to be part of your care. You were hospitalized for folliculitis. We treated you with antibiotics.  See the changes in your medications and management of your chronic conditions below:  *For your folliculitis -We have STARTED you on these following medications:  - Linezolid , take one pill twice a day, until you run out of pills  *For your HIV -Continue taking:  -Biktarvy , one pill daily  -Bactrim , one pill three times a week  FOLLOW UP APPOINTMENTS:  Call ID clinic to make follow up appointment.   Please make sure to take all three of the medications as instructed.   Please call your PCP or our clinic if you have any questions or concerns, we may be able to help and keep you from a long and expensive emergency room wait. Our clinic and after hours phone number is [phone]. The best time to call is Monday through Friday 9 am to 4 pm but there is always someone available 24/7 if you have an emergency. If you need medication refills please notify your pharmacy one week in advance and they will send us  a request.   We are glad you are feeling better,  Viktoria King Internal Medicine Inpatient Teaching Service at [facility]

## 2023-10-18 NOTE — Progress Notes (Signed)
 All medications delivered to Emergency planning/management officer.  All questions answered.  Patient awaiting prison transport

## 2023-10-18 NOTE — Plan of Care (Signed)
  Problem: Pain Managment: Goal: General experience of comfort will improve and/or be controlled Outcome: Progressing   Problem: Safety: Goal: Ability to remain free from injury will improve Outcome: Progressing   Problem: Skin Integrity: Goal: Risk for impaired skin integrity will decrease Outcome: Progressing

## 2023-10-18 NOTE — Progress Notes (Signed)
 AVS completed and given to officer. Medications picked up from [facility] pharmacy and given to officer. Transported patient to Radiation protection practitioner.

## 2023-10-20 LAB — CULTURE, BLOOD (ROUTINE X 2)
Culture: NO GROWTH
Culture: NO GROWTH

## 2023-10-21 LAB — AEROBIC/ANAEROBIC CULTURE W GRAM STAIN (SURGICAL/DEEP WOUND)

## 2023-10-31 ENCOUNTER — Other Ambulatory Visit: Payer: Self-pay

## 2023-10-31 DIAGNOSIS — B20 Human immunodeficiency virus [HIV] disease: Secondary | ICD-10-CM

## 2023-10-31 MED ORDER — SULFAMETHOXAZOLE-TRIMETHOPRIM 800-160 MG PO TABS: 1.0000 | ORAL_TABLET | ORAL | 0 refills | Status: AC

## 2023-10-31 MED ORDER — BICTEGRAVIR-EMTRICITAB-TENOFOV 50-200-25 MG PO TABS: 1.0000 | ORAL_TABLET | Freq: Every day | ORAL | 11 refills | Status: AC

## 2023-11-03 ENCOUNTER — Ambulatory Visit: Admitting: Infectious Diseases

## 2023-12-08 ENCOUNTER — Ambulatory Visit: Admitting: Infectious Diseases

## 2023-12-23 ENCOUNTER — Ambulatory Visit: Admitting: Internal Medicine

## 2024-01-11 ENCOUNTER — Other Ambulatory Visit (HOSPITAL_COMMUNITY)
Admission: RE | Admit: 2024-01-11 | Discharge: 2024-01-11 | Disposition: A | Discharge status: Home / Self Care | Source: Ambulatory Visit | Attending: Internal Medicine | Admitting: Internal Medicine

## 2024-01-11 ENCOUNTER — Other Ambulatory Visit: Payer: Self-pay

## 2024-01-11 ENCOUNTER — Ambulatory Visit: Admitting: Internal Medicine

## 2024-01-11 ENCOUNTER — Encounter: Payer: Self-pay | Admitting: Internal Medicine

## 2024-01-11 VITALS — BP 134/90 | HR 76 | Temp 98.7°F | Resp 16

## 2024-01-11 DIAGNOSIS — Z23 Encounter for immunization: Secondary | ICD-10-CM | POA: Diagnosis not present

## 2024-01-11 DIAGNOSIS — Z113 Encounter for screening for infections with a predominantly sexual mode of transmission: Secondary | ICD-10-CM | POA: Diagnosis not present

## 2024-01-11 DIAGNOSIS — I10 Essential (primary) hypertension: Secondary | ICD-10-CM | POA: Diagnosis not present

## 2024-01-11 DIAGNOSIS — Z79899 Other long term (current) drug therapy: Secondary | ICD-10-CM

## 2024-01-11 DIAGNOSIS — B20 Human immunodeficiency virus [HIV] disease: Secondary | ICD-10-CM

## 2024-01-11 NOTE — Patient Instructions (Signed)
 See detention paperwork

## 2024-01-11 NOTE — Progress Notes (Addendum)
 Patient Name: Larry Lutz  DOB: 09-19-1990  MRN: [id number]   Brief Narrative:  Hayze Gazda is a 33 y.o. male with HIV, Dx in 2017 in [place] (no records available). Inconsistent with medication adherence. 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 n       History of Present Illness         [facility] from detention facility today for routine hiv visit Complains of itching on legs -- tried calamine lotion not helping; hydroxizine 10 mg helps but asking higher dose as doesn't doe a good job as well at 10 mg now No other complaint   Other problems: Started on amlodipine started at detention center complains of leg swelling Taking mirtazepine for depression    Review of Systems  All other systems reviewed and are negative.   Past Medical History:  Diagnosis Date   Anxiety    Asthma    Colitis    Colon polyp    Crohn disease (HCC)    Depressed    GI bleed    HIV (human immunodeficiency virus infection) (HCC)    Hypertension    IBS (irritable bowel syndrome)    UC (ulcerative colitis) (HCC)    No Known Allergies    Outpatient Medications Prior to Visit  Medication Sig Dispense Refill   bictegravir-emtricitabine -tenofovir  AF (BIKTARVY ) 50-200-25 MG TABS tablet Take 1 tablet by mouth daily. Try to take at the same time each day with or without food. 30 tablet 11   hydrOXYzine  (ATARAX ) 10 MG tablet Take 1 tablet (10 mg total) by mouth 3 (three) times daily as needed for itching or anxiety. 30 tablet 0   sulfamethoxazole -trimethoprim  (BACTRIM  DS) 800-160 MG tablet Take 1 tablet by mouth 3 (three) times a week. 12 tablet 0   No facility-administered medications prior to visit.    Social History   Tobacco Use   Smoking status:  Every Day   Smokeless tobacco: Never  Vaping Use   Vaping status: Never Used  Substance Use Topics   Alcohol use: Yes   Drug use: Yes    Types: Marijuana, Methamphetamines, Crack cocaine    Objective    Physical Exam and Objective Findings:  Vitals:   01/11/24 0858  BP: (!) 134/90  Pulse: 76  Resp: 16  Temp: 98.7 F (37.1 C)  TempSrc: Oral  SpO2: 100%    There is no height or weight on file to calculate BMI.    Physical Exam Constitutional:      Appearance: Normal appearance. He is not ill-appearing.  HENT:     Head: Normocephalic.     Mouth/Throat:     Mouth: Mucous membranes are moist.     Pharynx: Oropharynx is clear.  Eyes:     General: No scleral icterus. Cardiovascular:     Rate and Rhythm: Normal rate.  Pulmonary:     Effort: Pulmonary effort is normal.  Musculoskeletal:        General: Normal range of motion.     Cervical back: Normal range of motion.  Skin:    Coloration: Skin is not jaundiced or pale.  Neurological:     Mental Status: He is alert and oriented to person,  place, and time.  Psychiatric:        Mood and Affect: Mood normal.        Judgment: Judgment normal.     Lab Results Lab Results  Component Value Date   WBC 10.2 10/16/2023   HGB 10.8 (L) 10/16/2023   HCT 35.2 (L) 10/16/2023   MCV 71.5 (L) 10/16/2023   PLT 382 10/16/2023    Lab Results  Component Value Date   CREATININE 1.11 10/16/2023   BUN 16 10/16/2023   NA 135 10/16/2023   K 3.5 10/16/2023   CL 100 10/16/2023   CO2 25 10/16/2023    Lab Results  Component Value Date   ALT 13 07/12/2023   AST 22 07/12/2023   ALKPHOS 71 05/04/2018   BILITOT 0.4 07/12/2023    Lab Results  Component Value Date   CHOL 236 (H) 01/05/2017   HDL 45 01/05/2017   LDLCALC 163 (H) 01/05/2017   TRIG 138 01/05/2017   CHOLHDL 5.2 (H) 01/05/2017   HIV 1 RNA Quant  Date Value  10/16/2023 3,250 copies/mL  07/12/2023 330 copies/mL (H)  10/09/2021 44 Copies/mL (H)   CD4 T Cell  Abs (/uL)  Date Value  10/16/2023 73 (L)  07/12/2023 157 (L)  07/12/2022 <35 (L)       ASSESSMENT & PLAN:               No orders of the defined types were placed in this encounter.   Orders Placed This Encounter  Procedures   HIV 1 RNA quant-no reflex-bld   T-helper cells (CD4) count   CBC w/Diff   COMPLETE METABOLIC PANEL WITHOUT GFR   RPR    #hiv  Takes biktarvy  in detention center. Cd4 <200 Risk msm   Lab Results  Component Value Date   CD4TCELL 4 (L) 10/16/2023   CD4TABS 73 (L) 10/16/2023     -discussed u=u -encourage compliance -continue current HIV medication -labs today -f/u in 9 months -continue bactrim    #std screening Triple screen Rpr   #htn Started on amlodipine in detention center; 01/11/24 complains of leg swelling  -advise detention center via paperwork to change to hydrochlorothiazide or losartan    #itch Suspect related to hiv Not working with emolient  -increase hydroxazyne to 25 mg po tid prn at his request; 10 mg does work but not as well anymore  #hcm -vaccine Prevnar 20 today -hepatitis 2018 hep b sAb reactive; sAg c Ab nonreactive -tb In detention center -metabolic N/a -cancer screening N/a

## 2024-01-12 LAB — CYTOLOGY, (ORAL, ANAL, URETHRAL) ANCILLARY ONLY
Chlamydia: NEGATIVE
Chlamydia: NEGATIVE
Comment: NEGATIVE
Comment: NEGATIVE
Comment: NORMAL
Comment: NORMAL
Neisseria Gonorrhea: NEGATIVE
Neisseria Gonorrhea: NEGATIVE

## 2024-01-13 LAB — URINE CYTOLOGY ANCILLARY ONLY
Chlamydia: NEGATIVE
Comment: NEGATIVE
Comment: NEGATIVE
Comment: NORMAL
Neisseria Gonorrhea: NEGATIVE
Trichomonas: NEGATIVE

## 2024-01-14 LAB — CBC WITH DIFFERENTIAL/PLATELET
Absolute Lymphocytes: 2244 {cells}/uL (ref 850–3900)
Absolute Monocytes: 724 {cells}/uL (ref 200–950)
Basophils Absolute: 43 {cells}/uL (ref 0–200)
Basophils Relative: 0.6 %
Eosinophils Absolute: 582 {cells}/uL — ABNORMAL HIGH (ref 15–500)
Eosinophils Relative: 8.2 %
HCT: 37.7 % — ABNORMAL LOW (ref 38.5–50.0)
Hemoglobin: 11.7 g/dL — ABNORMAL LOW (ref 13.2–17.1)
MCH: 21.4 pg — ABNORMAL LOW (ref 27.0–33.0)
MCHC: 31 g/dL — ABNORMAL LOW (ref 32.0–36.0)
MCV: 68.9 fL — ABNORMAL LOW (ref 80.0–100.0)
MPV: 10.1 fL (ref 7.5–12.5)
Monocytes Relative: 10.2 %
Neutro Abs: 3507 {cells}/uL (ref 1500–7800)
Neutrophils Relative %: 49.4 %
Platelets: 406 Thousand/uL — ABNORMAL HIGH (ref 140–400)
RBC: 5.47 Million/uL (ref 4.20–5.80)
RDW: 17.6 % — ABNORMAL HIGH (ref 11.0–15.0)
Total Lymphocyte: 31.6 %
WBC: 7.1 Thousand/uL (ref 3.8–10.8)

## 2024-01-14 LAB — COMPLETE METABOLIC PANEL WITHOUT GFR
AG Ratio: 1 (calc) (ref 1.0–2.5)
ALT: 22 U/L (ref 9–46)
AST: 27 U/L (ref 10–40)
Albumin: 4.5 g/dL (ref 3.6–5.1)
Alkaline phosphatase (APISO): 79 U/L (ref 36–130)
BUN: 13 mg/dL (ref 7–25)
CO2: 27 mmol/L (ref 20–32)
Calcium: 10.3 mg/dL (ref 8.6–10.3)
Chloride: 100 mmol/L (ref 98–110)
Creat: 1.04 mg/dL (ref 0.60–1.26)
Globulin: 4.7 g/dL — ABNORMAL HIGH (ref 1.9–3.7)
Glucose, Bld: 92 mg/dL (ref 65–99)
Potassium: 4.1 mmol/L (ref 3.5–5.3)
Sodium: 137 mmol/L (ref 135–146)
Total Bilirubin: 0.8 mg/dL (ref 0.2–1.2)
Total Protein: 9.2 g/dL — ABNORMAL HIGH (ref 6.1–8.1)

## 2024-01-14 LAB — RPR: RPR Ser Ql: NONREACTIVE

## 2024-01-14 LAB — HIV-1 RNA QUANT-NO REFLEX-BLD
HIV 1 RNA Quant: 27 {copies}/mL — ABNORMAL HIGH
HIV-1 RNA Quant, Log: 1.43 {Log_copies}/mL — ABNORMAL HIGH

## 2024-02-29 ENCOUNTER — Other Ambulatory Visit: Payer: Self-pay

## 2024-02-29 ENCOUNTER — Encounter (HOSPITAL_COMMUNITY): Payer: Self-pay

## 2024-02-29 ENCOUNTER — Emergency Department (HOSPITAL_COMMUNITY)

## 2024-02-29 ENCOUNTER — Inpatient Hospital Stay (HOSPITAL_COMMUNITY)
Admission: EM | Admit: 2024-02-29 | Discharge: 2024-03-06 | DRG: 975 | Disposition: A | Discharge status: Home / Self Care | Attending: Internal Medicine | Admitting: Internal Medicine

## 2024-02-29 DIAGNOSIS — M25562 Pain in left knee: Secondary | ICD-10-CM | POA: Diagnosis present

## 2024-02-29 DIAGNOSIS — L739 Follicular disorder, unspecified: Secondary | ICD-10-CM | POA: Diagnosis present

## 2024-02-29 DIAGNOSIS — L02416 Cutaneous abscess of left lower limb: Principal | ICD-10-CM | POA: Diagnosis present

## 2024-02-29 DIAGNOSIS — Z5989 Other problems related to housing and economic circumstances: Secondary | ICD-10-CM

## 2024-02-29 DIAGNOSIS — L03115 Cellulitis of right lower limb: Secondary | ICD-10-CM

## 2024-02-29 DIAGNOSIS — L0293 Carbuncle, unspecified: Secondary | ICD-10-CM | POA: Diagnosis present

## 2024-02-29 DIAGNOSIS — Z5900 Homelessness unspecified: Secondary | ICD-10-CM

## 2024-02-29 DIAGNOSIS — R911 Solitary pulmonary nodule: Secondary | ICD-10-CM | POA: Diagnosis present

## 2024-02-29 DIAGNOSIS — Z5982 Transportation insecurity: Secondary | ICD-10-CM | POA: Diagnosis not present

## 2024-02-29 DIAGNOSIS — K5090 Crohn's disease, unspecified, without complications: Secondary | ICD-10-CM | POA: Diagnosis present

## 2024-02-29 DIAGNOSIS — I10 Essential (primary) hypertension: Secondary | ICD-10-CM | POA: Diagnosis present

## 2024-02-29 DIAGNOSIS — F419 Anxiety disorder, unspecified: Secondary | ICD-10-CM | POA: Diagnosis present

## 2024-02-29 DIAGNOSIS — J45909 Unspecified asthma, uncomplicated: Secondary | ICD-10-CM | POA: Diagnosis present

## 2024-02-29 DIAGNOSIS — L0390 Cellulitis, unspecified: Secondary | ICD-10-CM | POA: Diagnosis present

## 2024-02-29 DIAGNOSIS — B9562 Methicillin resistant Staphylococcus aureus infection as the cause of diseases classified elsewhere: Secondary | ICD-10-CM | POA: Diagnosis present

## 2024-02-29 DIAGNOSIS — Z833 Family history of diabetes mellitus: Secondary | ICD-10-CM | POA: Diagnosis not present

## 2024-02-29 DIAGNOSIS — Z91148 Patient's other noncompliance with medication regimen for other reason: Secondary | ICD-10-CM | POA: Diagnosis not present

## 2024-02-29 DIAGNOSIS — Z8042 Family history of malignant neoplasm of prostate: Secondary | ICD-10-CM

## 2024-02-29 DIAGNOSIS — B20 Human immunodeficiency virus [HIV] disease: Secondary | ICD-10-CM | POA: Diagnosis present

## 2024-02-29 DIAGNOSIS — D649 Anemia, unspecified: Secondary | ICD-10-CM | POA: Diagnosis present

## 2024-02-29 DIAGNOSIS — A419 Sepsis, unspecified organism: Principal | ICD-10-CM | POA: Diagnosis present

## 2024-02-29 DIAGNOSIS — Z79899 Other long term (current) drug therapy: Secondary | ICD-10-CM | POA: Diagnosis not present

## 2024-02-29 DIAGNOSIS — L03119 Cellulitis of unspecified part of limb: Principal | ICD-10-CM

## 2024-02-29 DIAGNOSIS — Z860100 Personal history of colon polyps, unspecified: Secondary | ICD-10-CM | POA: Diagnosis not present

## 2024-02-29 DIAGNOSIS — Z87891 Personal history of nicotine dependence: Secondary | ICD-10-CM

## 2024-02-29 DIAGNOSIS — F32A Depression, unspecified: Secondary | ICD-10-CM | POA: Diagnosis present

## 2024-02-29 DIAGNOSIS — M7989 Other specified soft tissue disorders: Secondary | ICD-10-CM | POA: Diagnosis not present

## 2024-02-29 LAB — CBC WITH DIFFERENTIAL/PLATELET
Abs Immature Granulocytes: 0.04 K/uL (ref 0.00–0.07)
Basophils Absolute: 0 K/uL (ref 0.0–0.1)
Basophils Relative: 0 %
Eosinophils Absolute: 0 K/uL (ref 0.0–0.5)
Eosinophils Relative: 0 %
HCT: 35.4 % — ABNORMAL LOW (ref 39.0–52.0)
Hemoglobin: 10.6 g/dL — ABNORMAL LOW (ref 13.0–17.0)
Immature Granulocytes: 0 %
Lymphocytes Relative: 18 %
Lymphs Abs: 2 K/uL (ref 0.7–4.0)
MCH: 21.9 pg — ABNORMAL LOW (ref 26.0–34.0)
MCHC: 29.9 g/dL — ABNORMAL LOW (ref 30.0–36.0)
MCV: 73.3 fL — ABNORMAL LOW (ref 80.0–100.0)
Monocytes Absolute: 1.4 K/uL — ABNORMAL HIGH (ref 0.1–1.0)
Monocytes Relative: 13 %
Neutro Abs: 7.5 K/uL (ref 1.7–7.7)
Neutrophils Relative %: 69 %
Platelets: 367 K/uL (ref 150–400)
RBC: 4.83 MIL/uL (ref 4.22–5.81)
RDW: 17.4 % — ABNORMAL HIGH (ref 11.5–15.5)
WBC: 10.8 K/uL — ABNORMAL HIGH (ref 4.0–10.5)
nRBC: 0 % (ref 0.0–0.2)

## 2024-02-29 LAB — COMPREHENSIVE METABOLIC PANEL WITH GFR
ALT: 14 U/L (ref 0–44)
AST: 31 U/L (ref 15–41)
Albumin: 3.9 g/dL (ref 3.5–5.0)
Alkaline Phosphatase: 74 U/L (ref 38–126)
Anion gap: 12 (ref 5–15)
BUN: 16 mg/dL (ref 6–20)
CO2: 29 mmol/L (ref 22–32)
Calcium: 9.5 mg/dL (ref 8.9–10.3)
Chloride: 97 mmol/L — ABNORMAL LOW (ref 98–111)
Creatinine, Ser: 1.26 mg/dL — ABNORMAL HIGH (ref 0.61–1.24)
GFR, Estimated: >60 mL/min (ref >60)
Glucose, Bld: 94 mg/dL (ref 70–99)
Potassium: 3.9 mmol/L (ref 3.5–5.1)
Sodium: 137 mmol/L (ref 135–145)
Total Bilirubin: 0.5 mg/dL (ref 0.0–1.2)
Total Protein: 8.4 g/dL — ABNORMAL HIGH (ref 6.5–8.1)

## 2024-02-29 LAB — I-STAT CG4 LACTIC ACID, ED
Lactic Acid, Venous: 3.6 mmol/L (ref 0.5–1.9)
Lactic Acid, Venous: 5.4 mmol/L (ref 0.5–1.9)
Lactic Acid, Venous: 0.8 mmol/L (ref 0.5–1.9)

## 2024-02-29 MED ORDER — BICTEGRAVIR-EMTRICITAB-TENOFOV 50-200-25 MG PO TABS
1.0000 | ORAL_TABLET | Freq: Every day | ORAL | Status: DC
  Administered 2024-03-01 – 2024-03-06 (×6): 1 via ORAL
  Filled 2024-02-29 (×6): qty 1

## 2024-02-29 MED ORDER — LACTATED RINGERS IV BOLUS (SEPSIS)
1000.0000 mL | Freq: Once | INTRAVENOUS | Status: AC
  Administered 2024-02-29: 20:00:00 1000 mL via INTRAVENOUS

## 2024-02-29 MED ORDER — LACTATED RINGERS IV SOLN: INTRAVENOUS | Status: DC

## 2024-02-29 MED ORDER — FOLIC ACID 1 MG PO TABS
1.0000 mg | ORAL_TABLET | Freq: Every day | ORAL | Status: DC
  Administered 2024-03-01 – 2024-03-06 (×6): 1 mg via ORAL
  Filled 2024-02-29 (×6): qty 1

## 2024-02-29 MED ORDER — ADULT MULTIVITAMIN W/MINERALS CH
1.0000 | ORAL_TABLET | Freq: Every day | ORAL | Status: DC
  Administered 2024-03-01 – 2024-03-05 (×5): 1 via ORAL
  Filled 2024-02-29 (×5): qty 1

## 2024-02-29 MED ORDER — HEPARIN SODIUM (PORCINE) 5000 UNIT/ML IJ SOLN
5000.0000 [IU] | Freq: Three times a day (TID) | INTRAMUSCULAR | Status: DC
  Administered 2024-03-01 – 2024-03-06 (×13): 5000 [IU] via SUBCUTANEOUS
  Filled 2024-02-29 (×15): qty 1

## 2024-02-29 MED ORDER — SODIUM CHLORIDE 0.9 % IV SOLN
2.0000 g | Freq: Three times a day (TID) | INTRAVENOUS | Status: DC
  Administered 2024-03-01 (×2): 2 g via INTRAVENOUS
  Filled 2024-02-29 (×2): qty 12.5

## 2024-02-29 MED ORDER — ACETAMINOPHEN 325 MG PO TABS
650.0000 mg | ORAL_TABLET | Freq: Once | ORAL | Status: AC
  Administered 2024-02-29: 20:00:00 650 mg via ORAL
  Filled 2024-02-29: qty 2

## 2024-02-29 MED ORDER — VANCOMYCIN HCL 750 MG/150ML IV SOLN
750.0000 mg | Freq: Once | INTRAVENOUS | Status: AC
  Administered 2024-02-29: 750 mg via INTRAVENOUS
  Filled 2024-02-29: qty 150

## 2024-02-29 MED ORDER — HYDROXYZINE HCL 10 MG PO TABS
10.0000 mg | ORAL_TABLET | Freq: Three times a day (TID) | ORAL | Status: DC | PRN
  Administered 2024-03-02 – 2024-03-03 (×2): 10 mg via ORAL
  Filled 2024-02-29 (×2): qty 1

## 2024-02-29 MED ORDER — ONDANSETRON HCL 4 MG/2ML IJ SOLN
4.0000 mg | Freq: Four times a day (QID) | INTRAMUSCULAR | Status: DC | PRN
  Filled 2024-02-29: qty 2

## 2024-02-29 MED ORDER — ONDANSETRON HCL 4 MG/2ML IJ SOLN
4.0000 mg | Freq: Once | INTRAMUSCULAR | Status: AC
  Administered 2024-02-29: 20:00:00 4 mg via INTRAVENOUS
  Filled 2024-02-29: qty 2

## 2024-02-29 MED ORDER — VANCOMYCIN HCL 750 MG/150ML IV SOLN
750.0000 mg | Freq: Two times a day (BID) | INTRAVENOUS | Status: DC
  Administered 2024-03-01 – 2024-03-04 (×6): 750 mg via INTRAVENOUS
  Filled 2024-02-29 (×7): qty 150

## 2024-02-29 MED ORDER — ALBUTEROL SULFATE (2.5 MG/3ML) 0.083% IN NEBU: 2.5000 mg | INHALATION_SOLUTION | RESPIRATORY_TRACT | Status: DC | PRN

## 2024-02-29 MED ORDER — ONDANSETRON HCL 4 MG PO TABS: 4.0000 mg | ORAL_TABLET | Freq: Four times a day (QID) | ORAL | Status: DC | PRN

## 2024-02-29 MED ORDER — LACTATED RINGERS IV BOLUS (SEPSIS)
250.0000 mL | Freq: Once | INTRAVENOUS | Status: AC
  Administered 2024-02-29: 20:00:00 250 mL via INTRAVENOUS

## 2024-02-29 MED ORDER — SODIUM CHLORIDE 0.9 % IV SOLN
2.0000 g | Freq: Once | INTRAVENOUS | Status: AC
  Administered 2024-02-29: 19:00:00 2 g via INTRAVENOUS
  Filled 2024-02-29: qty 12.5

## 2024-02-29 MED ORDER — VANCOMYCIN HCL IN DEXTROSE 1-5 GM/200ML-% IV SOLN
1000.0000 mg | Freq: Once | INTRAVENOUS | Status: AC
  Administered 2024-02-29: 19:00:00 1000 mg via INTRAVENOUS
  Filled 2024-02-29: qty 200

## 2024-02-29 MED ORDER — IOHEXOL 350 MG/ML SOLN
75.0000 mL | Freq: Once | INTRAVENOUS | Status: AC | PRN
  Administered 2024-02-29: 21:00:00 75 mL via INTRAVENOUS

## 2024-02-29 NOTE — ED Notes (Signed)
 Patient transported to CT

## 2024-02-29 NOTE — Progress Notes (Signed)
 Pharmacy Antibiotic Note  Larry Lutz is a 33 y.o. male admitted on 02/29/2024 with skin lesions on lower extremities/groin area.  Pharmacy has been consulted for vancomycin  dosing for cellulitis.  -WBC 10.8, sCr 1.26 (bl~1), Tmax 103.1 -Vanc 1g x1 given -Blood cultures collected  Plan: -Cefepime  2g IV every 8 hours -Give additional 750mg  of Vancomycin  for total loading dose of 1750mg  -Vancomycin  750mg  IV every 12 hours (AUC 464, Vd 0.72, IBW, sCr 1.26) -Monitor renal function -Follow up signs of clinical improvement, LOT, de-escalation of antibiotics   Height: 5' 4 (162.6 cm) Weight: 72.6 kg (160 lb) IBW/kg (Calculated) : 59.2  Temp (24hrs), Avg:100.8 F (38.2 C), Min:98.9 F (37.2 C), Max:103.1 F (39.5 C)  Recent Labs  Lab 02/29/24 1911 02/29/24 1931 02/29/24 2141 02/29/24 2319  WBC 10.8*  --   --   --   CREATININE 1.26*  --   --   --   LATICACIDVEN  --  3.6* 5.4* 0.8    Estimated Creatinine Clearance: 75.5 mL/min (A) (by C-G formula based on SCr of 1.26 mg/dL (H)).    Allergies[1]  Antimicrobials this admission: Cefepime  12/17 >>  Vancomycin  12/17 >>    Microbiology results: 12/17 BCx:   Thank you for allowing pharmacy to be a part of this patients care.  Lynwood Poplar, PharmD, BCPS Clinical Pharmacist 02/29/2024 11:33 PM       [1] No Known Allergies

## 2024-02-29 NOTE — ED Provider Triage Note (Addendum)
 Emergency Medicine Provider Triage Evaluation Note  Larry Lutz , a 33 y.o. male  was evaluated in triage.  Pt complains of numerous abscesses primarily involving his left lower extremity, groin and rectal area.  Endorses subjective fevers and chills.  History of HIV.  Just got out of jail.  Recent admission prior for similar complaint.  Additionally complaining of chest pain and shortness of breath.  Review of Systems  Positive:  Negative:   Physical Exam  BP 132/86 (BP Location: Right Arm)   Pulse (!) 111   Temp 98.9 F (37.2 C)   Resp 17   Ht 5' 4 (1.626 m)   Wt 72.6 kg   SpO2 99%   BMI 27.46 kg/m  Gen:   Awake, no distress   Resp:  Normal effort  MSK:   Moves extremities without difficulty  Other:  Numerous abscesses involving bilateral lower extremities, worse on the left with edema involving the left knee into the calf as well as in the perineal and rectal region, neurovasc intact  Medical Decision Making  Medically screening exam initiated at 6:00 PM.  Appropriate orders placed.  Jesua Tamblyn was informed that the remainder of the evaluation will be completed by another provider, this initial triage assessment does not replace that evaluation, and the importance of remaining in the ED until their evaluation is complete.     Donnajean Lynwood DEL, PA-C 02/29/24 1802    Donnajean Lynwood DEL, PA-C 02/29/24 1821

## 2024-02-29 NOTE — ED Triage Notes (Signed)
 Pt c.o swelling on his left side from his arm down to his leg with associated numbness and burning. Pt was recently released from jail but was given most of his daily medications. Pt was admitted in August for cellulitis. Pt had hx of HIV, sent here from [facility] for futher workup. Pt also c.o intermittent chest pain

## 2024-02-29 NOTE — ED Notes (Signed)
 Patient transported to X-ray

## 2024-02-29 NOTE — Sepsis Progress Note (Signed)
 Elink monitoring for the code sepsis protocol.

## 2024-02-29 NOTE — Sepsis Progress Note (Signed)
 Notified provider via secure chat of need to order repeat lactic acid.

## 2024-02-29 NOTE — ED Triage Notes (Addendum)
 Pt presents with swollen nodules on arm, legs, buttock, and anus. Some are scabbed and her reports others are draining. He reports subjective fevers.

## 2024-02-29 NOTE — H&P (Incomplete)
 History and Physical    Larry Lutz FMW:[id number] DOB: February 26, 1991 DOA: 02/29/2024  PCP: Patient, No Pcp Per  Patient coming from: home  I have personally briefly reviewed patient's old medical records in [facility]  Chief Complaint: skin sores  HPI: Larry Lutz is a 33 y.o. male with medical history significant of  HIV on Biktarvy , Ulcerative colitis not on controller medication, hx of MRSA folliculitis/cellulitis, Hypertension not currently on medications, who presents to ED with complaint of few weeks of pustules on his lower extremities as well as anus. He notes symptoms of progressed with worse affected limb being his left. He notes he has tender and red area over his knee in the area where a pustule recently ruptures. He also noted area mid lateral thigh that is warm erythematous and tender s/p pustule rupture. He notes no n/v/d/chest pain / sob.  He notes he has recently been discharged from jail.  ED Course:  Afeb, bp 132/86, hr 111, rr 17, sat 99%  EKG: nsr 103 nonspecfic  st-t wave changes  Wbc 10.8,  hgb 10.6 Na 137, K 3.9, Cl 97, glu 94, cr 1.26 LR 2L, vanc cefepime  Lactic 5.4 was 3.6 CTPE IMPRESSION: 1. No evidence of pulmonary embolism. 2. Small 6 mm nodule in the right middle lobe anteriorly.Per Fleischner Society Guidelines recommend a non-contrast chest CT at 6-12 months, then consider an additional non-contrast chest CT at 18-24 months.  CTAB IMPRESSION: 1. No acute abnormality in the abdomen or pelvis, and no drainable fluid collection identified.  Tx cefepime  /vanc Review of Systems: As per HPI otherwise 10 point review of systems negative.   Past Medical History:  Diagnosis Date   Anxiety    Asthma    Colitis    Colon polyp    Crohn disease (HCC)    Depressed    GI bleed    HIV (human immunodeficiency virus infection) (HCC)    Hypertension    IBS (irritable bowel syndrome)    UC (ulcerative colitis) (HCC)     Past Surgical History:   Procedure Laterality Date   BRAIN SURGERY     COLONOSCOPY     in [place]  around 2017 or 2018      reports that he has quit smoking. His smoking use included cigarettes. He has never used smokeless tobacco. He reports that he does not currently use alcohol. He reports that he does not currently use drugs after having used the following drugs: Marijuana, Methamphetamines, and Crack cocaine.  Allergies[1]  Family History  Problem Relation Age of Onset   Ulcerative colitis Mother    Irritable bowel syndrome Mother    Prostate cancer Paternal Grandfather    Diabetes Paternal Aunt    Colon cancer Neg Hx    Esophageal cancer Neg Hx    Rectal cancer Neg Hx    Stomach cancer Neg Hx    *** Prior to Admission medications  Medication Sig Start Date End Date Taking? Authorizing Provider  bictegravir-emtricitabine -tenofovir  AF (BIKTARVY ) 50-200-25 MG TABS tablet Take 1 tablet by mouth daily. Try to take at the same time each day with or without food. 10/31/23  Yes [name], NP  hydrOXYzine  (ATARAX ) 10 MG tablet Take 1 tablet (10 mg total) by mouth 3 (three) times daily as needed for itching or anxiety. 10/18/23  Yes   sulfamethoxazole -trimethoprim  (BACTRIM  DS) 800-160 MG tablet Take 1 tablet by mouth 3 (three) times a week. Patient not taking: Reported on 02/29/2024 10/31/23   Melvenia Corean  N, NP    Physical Exam: Vitals:   02/29/24 2045 02/29/24 2200 02/29/24 2230 02/29/24 2300  BP: 134/62 (!) 147/91 (!) 136/95   Pulse: (!) 104 (!) 105 100   Resp: 20 19 20    Temp:    100.3 F (37.9 C)  TempSrc:    Oral  SpO2: 100% 100% 100%   Weight:      Height:        Constitutional: NAD, calm, comfortable Vitals:   02/29/24 2045 02/29/24 2200 02/29/24 2230 02/29/24 2300  BP: 134/62 (!) 147/91 (!) 136/95   Pulse: (!) 104 (!) 105 100   Resp: 20 19 20    Temp:    100.3 F (37.9 C)  TempSrc:    Oral  SpO2: 100% 100% 100%   Weight:      Height:       Eyes: PERRL, lids and  conjunctivae normal ENMT: Mucous membranes are moist. Posterior pharynx clear of any exudate or lesions.Normal dentition.  Neck: normal, supple, no masses, no thyromegaly Respiratory: clear to auscultation bilaterally, no wheezing, no crackles. Normal respiratory effort. No accessory muscle use.  Cardiovascular: Regular rate and rhythm, no murmurs / rubs / gallops. No extremity edema. 2+ pedal pulses. No carotid bruits.  Abdomen: no tenderness, no masses palpated. No hepatosplenomegaly. Bowel sounds positive.  Musculoskeletal: no clubbing / cyanosis. No joint deformity upper and lower extremities. Good ROM, no contractures. Normal muscle tone.  Skin: no rashes, lesions, ulcers. No induration Neurologic: CN 2-12 grossly intact. Sensation intact, DTR normal. Strength 5/5 in all 4.  Psychiatric: Normal judgment and insight. Alert and oriented x 3. Normal mood.    Labs on Admission: I have personally reviewed following labs and imaging studies  CBC: Recent Labs  Lab 02/29/24 1911  WBC 10.8*  NEUTROABS 7.5  HGB 10.6*  HCT 35.4*  MCV 73.3*  PLT 367   Basic Metabolic Panel: Recent Labs  Lab 02/29/24 1911  NA 137  K 3.9  CL 97*  CO2 29  GLUCOSE 94  BUN 16  CREATININE 1.26*  CALCIUM 9.5   GFR: Estimated Creatinine Clearance: 75.5 mL/min (A) (by C-G formula based on SCr of 1.26 mg/dL (H)). Liver Function Tests: Recent Labs  Lab 02/29/24 1911  AST 31  ALT 14  ALKPHOS 74  BILITOT 0.5  PROT 8.4*  ALBUMIN 3.9   No results for input(s): LIPASE, AMYLASE in the last 168 hours. No results for input(s): AMMONIA in the last 168 hours. Coagulation Profile: No results for input(s): INR, PROTIME in the last 168 hours. Cardiac Enzymes: No results for input(s): CKTOTAL, CKMB, CKMBINDEX, TROPONINI in the last 168 hours. BNP (last 3 results) No results for input(s): PROBNP in the last 8760 hours. HbA1C: No results for input(s): HGBA1C in the last 72  hours. CBG: No results for input(s): GLUCAP in the last 168 hours. Lipid Profile: No results for input(s): CHOL, HDL, LDLCALC, TRIG, CHOLHDL, LDLDIRECT in the last 72 hours. Thyroid Function Tests: No results for input(s): TSH, T4TOTAL, FREET4, T3FREE, THYROIDAB in the last 72 hours. Anemia Panel: No results for input(s): VITAMINB12, FOLATE, FERRITIN, TIBC, IRON, RETICCTPCT in the last 72 hours. Urine analysis:    Component Value Date/Time   COLORURINE YELLOW 05/03/2018 1325   APPEARANCEUR CLEAR 05/03/2018 1325   LABSPEC 1.011 05/03/2018 1325   PHURINE 8.0 05/03/2018 1325   GLUCOSEU NEGATIVE 05/03/2018 1325   HGBUR SMALL (A) 05/03/2018 1325   BILIRUBINUR NEGATIVE 05/03/2018 1325   KETONESUR NEGATIVE 05/03/2018 1325  PROTEINUR NEGATIVE 05/03/2018 1325   NITRITE NEGATIVE 05/03/2018 1325   LEUKOCYTESUR NEGATIVE 05/03/2018 1325    Radiological Exams on Admission: CT ABDOMEN PELVIS W CONTRAST Result Date: 02/29/2024 EXAM: CT ABDOMEN AND PELVIS WITH CONTRAST 02/29/2024 09:00:00 PM TECHNIQUE: CT of the abdomen and pelvis was performed with the administration of 75 mL of iohexol  (OMNIPAQUE ) 350 MG/ML injection. Multiplanar reformatted images are provided for review. Automated exposure control, iterative reconstruction, and/or weight-based adjustment of the mA/kV was utilized to reduce the radiation dose to as low as reasonably achievable. COMPARISON: 05/01/2018 CLINICAL HISTORY: Possible subcutaneous abscess. FINDINGS: LIVER: The liver is unremarkable. GALLBLADDER AND BILE DUCTS: Gallbladder is unremarkable. No biliary ductal dilatation. SPLEEN: No acute abnormality. PANCREAS: No acute abnormality. ADRENAL GLANDS: No acute abnormality. KIDNEYS, URETERS AND BLADDER: No stones in the kidneys or ureters. No hydronephrosis. No perinephric or periureteral stranding. Urinary bladder is unremarkable. GI AND BOWEL: Stomach demonstrates no acute abnormality. There is  no bowel obstruction. The appendix is within normal limits. PERITONEUM AND RETROPERITONEUM: No ascites. No free air. VASCULATURE: Aorta is normal in caliber. LYMPH NODES: No lymphadenopathy. REPRODUCTIVE ORGANS: No acute abnormality. BONES AND SOFT TISSUES: Bony structures are within normal limits. No focal soft tissue abnormality. IMPRESSION: 1. No acute abnormality in the abdomen or pelvis, and no drainable fluid collection identified. Electronically signed by: Oneil Devonshire MD 02/29/2024 09:29 PM EST RP Workstation: [id number]   CT Angio Chest PE W and/or Wo Contrast Result Date: 02/29/2024 EXAM: CTA CHEST 02/29/2024 09:00:00 PM TECHNIQUE: CTA of the chest was performed without and with the administration of 75 mL of iohexol  (OMNIPAQUE ) 350 MG/ML injection. Multiplanar reformatted images are provided for review. MIP images are provided for review. Automated exposure control, iterative reconstruction, and/or weight based adjustment of the mA/kV was utilized to reduce the radiation dose to as low as reasonably achievable. COMPARISON: Chest x-ray from earlier in the same day. CLINICAL HISTORY: Shortness of breath. FINDINGS: PULMONARY ARTERIES: The pulmonary artery is well visualized with a normal branching pattern. No intraluminal filling defect to suggest pulmonary embolism is noted. Main pulmonary artery is normal in caliber. MEDIASTINUM: No cardiac enlargement is noted. The pericardium demonstrates no acute abnormality. The thoracic aorta is unremarkable. LYMPH NODES: No mediastinal, hilar or axillary lymphadenopathy. LUNGS AND PLEURA: The lungs are well aerated bilaterally. A small 6 mm nodule is noted in the right middle lobe anteriorly, best seen on image number 98 and 99 of series 5. No focal consolidation or pulmonary edema. No evidence of pleural effusion or pneumothorax. UPPER ABDOMEN: Limited images of the upper abdomen are unremarkable. SOFT TISSUES AND BONES: Bony structures are within normal limits.  No acute soft tissue abnormality. IMPRESSION: 1. No evidence of pulmonary embolism. 2. Small 6 mm nodule in the right middle lobe anteriorly.Per Fleischner Society Guidelines recommend a non-contrast chest CT at 6-12 months, then consider an additional non-contrast chest CT at 18-24 months. Electronically signed by: Oneil Devonshire MD 02/29/2024 09:26 PM EST RP Workstation: [id number]   DG Chest 2 View Result Date: 02/29/2024 CLINICAL DATA:  Shortness of breath. EXAM: CHEST - 2 VIEW COMPARISON:  Chest radiograph dated 06/01/2016. FINDINGS: A mild central vascular congestion. No focal consolidation, pleural effusion or pneumothorax. The cardiac silhouette is within normal limits. No acute osseous pathology. IMPRESSION: Mild central vascular congestion. No focal consolidation. Electronically Signed   By: Vanetta Chou M.D.   On: 02/29/2024 19:58    EKG: Independently reviewed. See above  Assessment/Plan  Funruncles complicated by cellulitis  -admit  to med tele  - continue on iv abx   HIV -contiue on Biktarvy    UC /Chrons -no current diagnosis      DVT prophylaxis: *** (Lovenox /Heparin /SCD's/anticoagulated/None (if comfort care) Code Status: *** (Full/Partial (specify details) Family Communication: *** (Specify name, relationship. Do not write discussed with patient. Specify tel # if discussed over the phone) Disposition Plan: *** (specify when and where you expect patient to be discharged) Consults called: *** (with names) Admission status: *** (inpatient / obs / tele / medical floor / SDU)   Camila DELENA Ned MD [facility] Pager [phone]  If 7PM-7AM, please contact night-coverage [email or web address] Password [email or web address]  02/29/2024, 11:21 PM       [1] No Known Allergies

## 2024-02-29 NOTE — ED Provider Notes (Signed)
 Larry Lutz   CSN: [id number] Arrival date & time: 02/29/24  1521     Patient presents with: Abscess (Multiple)   Larry Lutz is a 33 y.o. male.    Abscess    Patient has a history of HIV disease, colitis, hypertension, irritable bowel syndrome.  Patient presents to the ED with complaints of skin lesions.  Patient states he has noticed several spots that are red and tender on his skin.  He has also noticed some small pustules.  These have occurred on both legs and he has also noticed some lesions on the buttocks around his perianal area.  He has not documented any fevers but he has been chilled.  Prior to Admission medications  Medication Sig Start Date End Date Taking? Authorizing Provider  bictegravir-emtricitabine -tenofovir  AF (BIKTARVY ) 50-200-25 MG TABS tablet Take 1 tablet by mouth daily. Try to take at the same time each day with or without food. 10/31/23  Yes [name], NP  hydrOXYzine  (ATARAX ) 10 MG tablet Take 1 tablet (10 mg total) by mouth 3 (three) times daily as needed for itching or anxiety. 10/18/23  Yes   sulfamethoxazole -trimethoprim  (BACTRIM  DS) 800-160 MG tablet Take 1 tablet by mouth 3 (three) times a week. Patient not taking: Reported on 02/29/2024 10/31/23   [name], NP    Allergies: Patient has no known allergies.    Review of Systems  Updated Vital Signs BP (!) 147/91   Pulse (!) 105   Temp (!) 103.1 F (39.5 C) (Oral)   Resp 19   Ht 1.626 m (5' 4)   Wt 72.6 kg   SpO2 100%   BMI 27.46 kg/m   Physical Exam Vitals and nursing Lutz reviewed.  Constitutional:      General: He is not in acute distress.    Appearance: He is well-developed. He is not diaphoretic.  HENT:     Head: Normocephalic and atraumatic.     Right Ear: External ear normal.     Left Ear: External ear normal.  Eyes:     General: No scleral icterus.       Right eye: No discharge.        Left eye: No  discharge.     Conjunctiva/sclera: Conjunctivae normal.  Neck:     Trachea: No tracheal deviation.  Cardiovascular:     Rate and Rhythm: Normal rate and regular rhythm.  Pulmonary:     Effort: Pulmonary effort is normal. No respiratory distress.     Breath sounds: Normal breath sounds. No stridor. No wheezing or rales.  Abdominal:     General: Bowel sounds are normal. There is no distension.     Palpations: Abdomen is soft.     Tenderness: There is no abdominal tenderness. There is no guarding or rebound.  Genitourinary:    Comments: No drainage noted in the perianal area, some areas of scarring suggestive of either possible prior folliculitis or healed herpes lesions Musculoskeletal:        General: No tenderness or deformity.     Cervical back: Neck supple.  Skin:    General: Skin is warm and dry.     Findings: Rash present.     Comments: Several areas of erythema some noted on left thigh also around the left knee, pustule noted on the right lower leg,  Neurological:     General: No focal deficit present.     Mental Status: He is alert.  Cranial Nerves: No cranial nerve deficit, dysarthria or facial asymmetry.     Sensory: No sensory deficit.     Motor: No abnormal muscle tone or seizure activity.     Coordination: Coordination normal.  Psychiatric:        Mood and Affect: Mood normal.     (all labs ordered are listed, but only abnormal results are displayed) Labs Reviewed  CBC WITH DIFFERENTIAL/PLATELET - Abnormal; Notable for the following components:      Result Value   WBC 10.8 (*)    Hemoglobin 10.6 (*)    HCT 35.4 (*)    MCV 73.3 (*)    MCH 21.9 (*)    MCHC 29.9 (*)    RDW 17.4 (*)    Monocytes Absolute 1.4 (*)    All other components within normal limits  COMPREHENSIVE METABOLIC PANEL WITH GFR - Abnormal; Notable for the following components:   Chloride 97 (*)    Creatinine, Ser 1.26 (*)    Total Protein 8.4 (*)    All other components within normal  limits  I-STAT CG4 LACTIC ACID, ED - Abnormal; Notable for the following components:   Lactic Acid, Venous 3.6 (*)    All other components within normal limits  I-STAT CG4 LACTIC ACID, ED - Abnormal; Notable for the following components:   Lactic Acid, Venous 5.4 (*)    All other components within normal limits  CULTURE, BLOOD (ROUTINE X 2)  CULTURE, BLOOD (ROUTINE X 2)    EKG: None  Radiology: CT ABDOMEN PELVIS W CONTRAST Result Date: 02/29/2024 EXAM: CT ABDOMEN AND PELVIS WITH CONTRAST 02/29/2024 09:00:00 PM TECHNIQUE: CT of the abdomen and pelvis was performed with the administration of 75 mL of iohexol  (OMNIPAQUE ) 350 MG/ML injection. Multiplanar reformatted images are provided for review. Automated exposure control, iterative reconstruction, and/or weight-based adjustment of the mA/kV was utilized to reduce the radiation dose to as low as reasonably achievable. COMPARISON: 05/01/2018 CLINICAL HISTORY: Possible subcutaneous abscess. FINDINGS: LIVER: The liver is unremarkable. GALLBLADDER AND BILE DUCTS: Gallbladder is unremarkable. No biliary ductal dilatation. SPLEEN: No acute abnormality. PANCREAS: No acute abnormality. ADRENAL GLANDS: No acute abnormality. KIDNEYS, URETERS AND BLADDER: No stones in the kidneys or ureters. No hydronephrosis. No perinephric or periureteral stranding. Urinary bladder is unremarkable. GI AND BOWEL: Stomach demonstrates no acute abnormality. There is no bowel obstruction. The appendix is within normal limits. PERITONEUM AND RETROPERITONEUM: No ascites. No free air. VASCULATURE: Aorta is normal in caliber. LYMPH NODES: No lymphadenopathy. REPRODUCTIVE ORGANS: No acute abnormality. BONES AND SOFT TISSUES: Bony structures are within normal limits. No focal soft tissue abnormality. IMPRESSION: 1. No acute abnormality in the abdomen or pelvis, and no drainable fluid collection identified. Electronically signed by: Oneil Devonshire MD 02/29/2024 09:29 PM EST RP Workstation:  [id number]   CT Angio Chest PE W and/or Wo Contrast Result Date: 02/29/2024 EXAM: CTA CHEST 02/29/2024 09:00:00 PM TECHNIQUE: CTA of the chest was performed without and with the administration of 75 mL of iohexol  (OMNIPAQUE ) 350 MG/ML injection. Multiplanar reformatted images are provided for review. MIP images are provided for review. Automated exposure control, iterative reconstruction, and/or weight based adjustment of the mA/kV was utilized to reduce the radiation dose to as low as reasonably achievable. COMPARISON: Chest x-ray from earlier in the same day. CLINICAL HISTORY: Shortness of breath. FINDINGS: PULMONARY ARTERIES: The pulmonary artery is well visualized with a normal branching pattern. No intraluminal filling defect to suggest pulmonary embolism is noted. Main pulmonary artery is normal in  caliber. MEDIASTINUM: No cardiac enlargement is noted. The pericardium demonstrates no acute abnormality. The thoracic aorta is unremarkable. LYMPH NODES: No mediastinal, hilar or axillary lymphadenopathy. LUNGS AND PLEURA: The lungs are well aerated bilaterally. A small 6 mm nodule is noted in the right middle lobe anteriorly, best seen on image number 98 and 99 of series 5. No focal consolidation or pulmonary edema. No evidence of pleural effusion or pneumothorax. UPPER ABDOMEN: Limited images of the upper abdomen are unremarkable. SOFT TISSUES AND BONES: Bony structures are within normal limits. No acute soft tissue abnormality. IMPRESSION: 1. No evidence of pulmonary embolism. 2. Small 6 mm nodule in the right middle lobe anteriorly.Per Fleischner Society Guidelines recommend a non-contrast chest CT at 6-12 months, then consider an additional non-contrast chest CT at 18-24 months. Electronically signed by: Oneil Devonshire MD 02/29/2024 09:26 PM EST RP Workstation: [id number]   DG Chest 2 View Result Date: 02/29/2024 CLINICAL DATA:  Shortness of breath. EXAM: CHEST - 2 VIEW COMPARISON:  Chest radiograph dated  06/01/2016. FINDINGS: A mild central vascular congestion. No focal consolidation, pleural effusion or pneumothorax. The cardiac silhouette is within normal limits. No acute osseous pathology. IMPRESSION: Mild central vascular congestion. No focal consolidation. Electronically Signed   By: Vanetta Chou M.D.   On: 02/29/2024 19:58     .Critical Care  Performed by: Randol Simmonds, MD Authorized by: Randol Simmonds, MD   Critical care provider statement:    Critical care time (minutes):  30   Critical care was time spent personally by me on the following activities:  Development of treatment plan with patient or surrogate, discussions with consultants, evaluation of patient's response to treatment, examination of patient, ordering and review of laboratory studies, ordering and review of radiographic studies, ordering and performing treatments and interventions, pulse oximetry, re-evaluation of patient's condition and review of old charts    Medications Ordered in the ED  lactated ringers  infusion ( Intravenous New Bag/Given 02/29/24 2027)  vancomycin  (VANCOCIN ) IVPB 1000 mg/200 mL premix (0 mg Intravenous Stopped 02/29/24 2018)  ceFEPIme  (MAXIPIME ) 2 g in sodium chloride  0.9 % 100 mL IVPB (0 g Intravenous Stopped 02/29/24 1947)  acetaminophen  (TYLENOL ) tablet 650 mg (650 mg Oral Given 02/29/24 1954)  lactated ringers  bolus 1,000 mL (0 mLs Intravenous Stopped 02/29/24 2025)    And  lactated ringers  bolus 1,000 mL (0 mLs Intravenous Stopped 02/29/24 2025)    And  lactated ringers  bolus 250 mL (0 mLs Intravenous Stopped 02/29/24 2034)  ondansetron  (ZOFRAN ) injection 4 mg (4 mg Intravenous Given 02/29/24 2023)  iohexol  (OMNIPAQUE ) 350 MG/ML injection 75 mL (75 mLs Intravenous Contrast Given 02/29/24 2055)    Clinical Course as of 02/29/24 2211  Wed Feb 29, 2024  2144 Abdominal pelvic CT negative.  Chest CT negative [JK]    Clinical Course User Index [JK] Randol Simmonds, MD                                  Medical Decision Making Problems Addressed: Cellulitis of lower extremity, unspecified laterality: acute illness or injury that poses a threat to life or bodily functions Folliculitis: acute illness or injury that poses a threat to life or bodily functions  Amount and/or Complexity of Data Reviewed Radiology: ordered and independent interpretation performed.  Risk OTC drugs. Prescription drug management. Decision regarding hospitalization.   Patient presented to the ED for evaluation of leg pain chills.  Patient has history of  HIV and has had cellulitis in folliculitis in the past.  On exam patient has notable swelling in his left leg.  He has erythema and tenderness that is consistent with cellulitis.  Low suspicion for DVT.  Patient mention some chest pain and shortness of breath to the triage provider.  His CT scans do not show any acute abnormality.  No obvious drainable abscess on exam.  No crepitus.  Low suspicion for necrotizing fasciitis.  Patient however does have fever and lactic acidosis.  He was started on sepsis protocol.  Will admit to the hospital for IV antibiotics further treatment.     Final diagnoses:  Cellulitis of lower extremity, unspecified laterality  Folliculitis    ED Discharge Orders     None          Randol Simmonds, MD 02/29/24 2211

## 2024-03-01 ENCOUNTER — Encounter (HOSPITAL_COMMUNITY): Payer: Self-pay | Admitting: Internal Medicine

## 2024-03-01 ENCOUNTER — Inpatient Hospital Stay (HOSPITAL_COMMUNITY)

## 2024-03-01 DIAGNOSIS — L03119 Cellulitis of unspecified part of limb: Secondary | ICD-10-CM

## 2024-03-01 DIAGNOSIS — M7989 Other specified soft tissue disorders: Secondary | ICD-10-CM

## 2024-03-01 LAB — CBC WITH DIFFERENTIAL/PLATELET
Abs Immature Granulocytes: 0.03 K/uL (ref 0.00–0.07)
Basophils Absolute: 0 K/uL (ref 0.0–0.1)
Basophils Relative: 0 %
Eosinophils Absolute: 0 K/uL (ref 0.0–0.5)
Eosinophils Relative: 0 %
HCT: 30.3 % — ABNORMAL LOW (ref 39.0–52.0)
Hemoglobin: 9.1 g/dL — ABNORMAL LOW (ref 13.0–17.0)
Immature Granulocytes: 0 %
Lymphocytes Relative: 21 %
Lymphs Abs: 1.8 K/uL (ref 0.7–4.0)
MCH: 22.3 pg — ABNORMAL LOW (ref 26.0–34.0)
MCHC: 30 g/dL (ref 30.0–36.0)
MCV: 74.3 fL — ABNORMAL LOW (ref 80.0–100.0)
Monocytes Absolute: 1 K/uL (ref 0.1–1.0)
Monocytes Relative: 11 %
Neutro Abs: 5.8 K/uL (ref 1.7–7.7)
Neutrophils Relative %: 68 %
Platelets: 298 K/uL (ref 150–400)
RBC: 4.08 MIL/uL — ABNORMAL LOW (ref 4.22–5.81)
RDW: 17.7 % — ABNORMAL HIGH (ref 11.5–15.5)
WBC: 8.7 K/uL (ref 4.0–10.5)
nRBC: 0 % (ref 0.0–0.2)

## 2024-03-01 LAB — COMPREHENSIVE METABOLIC PANEL WITH GFR
ALT: 10 U/L (ref 0–44)
AST: 29 U/L (ref 15–41)
Albumin: 3.2 g/dL — ABNORMAL LOW (ref 3.5–5.0)
Alkaline Phosphatase: 70 U/L (ref 38–126)
Anion gap: 12 (ref 5–15)
BUN: 12 mg/dL (ref 6–20)
CO2: 23 mmol/L (ref 22–32)
Calcium: 8.5 mg/dL — ABNORMAL LOW (ref 8.9–10.3)
Chloride: 98 mmol/L (ref 98–111)
Creatinine, Ser: 1.04 mg/dL (ref 0.61–1.24)
GFR, Estimated: >60 mL/min (ref >60)
Glucose, Bld: 100 mg/dL — ABNORMAL HIGH (ref 70–99)
Potassium: 3.6 mmol/L (ref 3.5–5.1)
Sodium: 133 mmol/L — ABNORMAL LOW (ref 135–145)
Total Bilirubin: 0.4 mg/dL (ref 0.0–1.2)
Total Protein: 6.8 g/dL (ref 6.5–8.1)

## 2024-03-01 LAB — HEMOGLOBIN A1C
Hgb A1c MFr Bld: 5.6 % (ref 4.8–5.6)
Mean Plasma Glucose: 114.02 mg/dL

## 2024-03-01 LAB — PROCALCITONIN: Procalcitonin: 0.13 ng/mL

## 2024-03-01 LAB — C-REACTIVE PROTEIN: CRP: 9.3 mg/dL — ABNORMAL HIGH (ref <1.0)

## 2024-03-01 MED ORDER — OXYCODONE HCL 5 MG PO TABS
5.0000 mg | ORAL_TABLET | Freq: Four times a day (QID) | ORAL | Status: DC | PRN
  Administered 2024-03-01 – 2024-03-05 (×6): 5 mg via ORAL
  Filled 2024-03-01 (×6): qty 1

## 2024-03-01 MED ORDER — LIDOCAINE-EPINEPHRINE (PF) 2 %-1:200000 IJ SOLN
10.0000 mL | Freq: Once | INTRAMUSCULAR | Status: DC
  Filled 2024-03-01: qty 20
  Filled 2024-03-01: qty 10

## 2024-03-01 MED ORDER — SODIUM CHLORIDE 0.9 % IV SOLN
3.0000 g | Freq: Four times a day (QID) | INTRAVENOUS | Status: DC
  Administered 2024-03-01 – 2024-03-02 (×3): 3 g via INTRAVENOUS
  Filled 2024-03-01 (×3): qty 8

## 2024-03-01 NOTE — Procedures (Signed)
 Incision and Drainage Procedure Note  Pre-operative Diagnosis: Left thigh abscess  Post-operative Diagnosis: same  Indications:  Larry Lutz is a 33 year old male that presented to the ED due to abscesses of the skin. General surgery was consulted to consider incision and drainage at bedside of left lower extremity at the thigh area. Area was noted to be warm, erythematous, edematous, and painful to the touch. Discussed risk and benefit of bedside I&D. Patient expressed understanding and agreed to proceed. Vertell Pringle, PA-C assisted during procedure.  Anesthesia: lidocaine -EPINEPHrine  (XYLOCAINE  W/EPI) 2 %-1:200000 (PF) used  Procedure Details  The procedure, risks and complications have been discussed in detail with the patient.  The skin was sterilely prepped in usual fashion. After adequate local anesthesia, I&D with a #11 blade was performed on the left thigh region of a superficial 1 inch abscess. Purulent drainage: present. Packing was placed in abscess cavity. The patient was observed until stable.  Drains/Packing: Packing of left thigh abscess placed.   Condition: Tolerated procedure well  Complications: none.  Marjorie Carlyon Favre, PA-C

## 2024-03-01 NOTE — Progress Notes (Signed)
 Left lower extremity venous duplex has been completed. Preliminary results can be found in CV Proc through chart review.  Results were given to Dr. Fairy.  03/01/2024 9:57 AM Cathlyn [name] RVT

## 2024-03-01 NOTE — Progress Notes (Addendum)
 PROGRESS NOTE    Larry Lutz  FMW:[id number] DOB: Jan 31, 1991 DOA: 02/29/2024 PCP: Patient, No Pcp Per   33/M w HIV on Biktarvy , Ulcerative colitis , hx of MRSA folliculitis/cellulitis, Hypertension presented to ED with complaint of few weeks of pustules on his lower extremities. He notes symptoms have progressed with his left limb being worse affected. He notes he has tender red area over his knee in the area where a pustule recently ruptured. He also notes area mid lateral thigh that is warm erythematous and tender, he was just released from jail a few weeks ago. 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 thigh -Continue IV vancomycin  and cefepime  for now - Will request surgical eval   HIV - CD4 count<100 in August 2025, was noncompliant with HAART then - History of alleged compliance since 8/25 onwards - Will need infectious disease follow-up - Will recheck CD4 count  Chronic anemia - Likely secondary to above, monitor  Pulmonary nodule - Needs follow-up    DVT prophylaxis: Heparin  subcutaneous Code Status: Full code Family Communication: None present Disposition Plan: Home in 1 to 2 days  Consultants:    Procedures:   Antimicrobials:    Objective: Vitals:   02/29/24 2300 03/01/24 0707 03/01/24 0708 03/01/24 0854  BP:   (!) 101/58 121/84  Pulse:   82 80  Resp:   20 18  Temp: 100.3 F (37.9 C) 99.1 F (37.3 C)  98.9 F (37.2 C)  TempSrc: Oral Oral  Oral  SpO2:   99% 99%  Weight:      Height:        Intake/Output Summary (Last 24 hours) at 03/01/2024 1147 Last data filed at 02/29/2024 2210 Gross per 24 hour  Intake 2350 ml  Output 400 ml  Net 1950 ml   Filed Weights    02/29/24 1658  Weight: 72.6 kg    Examination:  General exam: Appears calm and comfortable  Respiratory system: Clear to auscultation Cardiovascular system: S1 & S2 heard, RRR.  Abd: nondistended, soft and nontender.Normal bowel sounds heard. Central nervous system: Alert and oriented. No focal neurological deficits. Extremities: no edema Skin: Multiple areas with in lower legs with folliculitis, abscess in left thigh Psychiatry:  Mood & affect appropriate.     Data Reviewed:   CBC: Recent Labs  Lab 02/29/24 1911 03/01/24 0007  WBC 10.8* 8.7  NEUTROABS 7.5 5.8  HGB 10.6* 9.1*  HCT 35.4* 30.3*  MCV 73.3* 74.3*  PLT 367 298   Basic Metabolic Panel: Recent Labs  Lab 02/29/24 1911 03/01/24 0007  NA 137 133*  K 3.9 3.6  CL 97* 98  CO2 29 23  GLUCOSE 94 100*  BUN 16 12  CREATININE 1.26* 1.04  CALCIUM 9.5 8.5*   GFR: Estimated Creatinine Clearance: 91.4 mL/min (by C-G formula based on SCr of 1.04 mg/dL). Liver Function Tests: Recent Labs  Lab 02/29/24 1911 03/01/24 0007  AST 31 29  ALT 14 10  ALKPHOS 74 70  BILITOT 0.5 0.4  PROT 8.4* 6.8  ALBUMIN 3.9 3.2*   No results for input(s): LIPASE, AMYLASE in the last 168 hours. No results for input(s): AMMONIA in the last 168 hours. Coagulation Profile: No results for input(s):  INR, PROTIME in the last 168 hours. Cardiac Enzymes: No results for input(s): CKTOTAL, CKMB, CKMBINDEX, TROPONINI in the last 168 hours. BNP (last 3 results) No results for input(s): PROBNP in the last 8760 hours. HbA1C: Recent Labs    03/01/24 0007  HGBA1C 5.6   CBG: No results for input(s): GLUCAP in the last 168 hours. Lipid Profile: No results for input(s): CHOL, HDL, LDLCALC, TRIG, CHOLHDL, LDLDIRECT in the last 72 hours. Thyroid Function Tests: No results for input(s): TSH, T4TOTAL, FREET4, T3FREE, THYROIDAB in the last 72 hours. Anemia Panel: No results for input(s):  VITAMINB12, FOLATE, FERRITIN, TIBC, IRON, RETICCTPCT in the last 72 hours. Urine analysis:    Component Value Date/Time   COLORURINE YELLOW 05/03/2018 1325   APPEARANCEUR CLEAR 05/03/2018 1325   LABSPEC 1.011 05/03/2018 1325   PHURINE 8.0 05/03/2018 1325   GLUCOSEU NEGATIVE 05/03/2018 1325   HGBUR SMALL (A) 05/03/2018 1325   BILIRUBINUR NEGATIVE 05/03/2018 1325   KETONESUR NEGATIVE 05/03/2018 1325   PROTEINUR NEGATIVE 05/03/2018 1325   NITRITE NEGATIVE 05/03/2018 1325   LEUKOCYTESUR NEGATIVE 05/03/2018 1325   Sepsis Labs: @LABRCNTIP (procalcitonin:4,lacticidven:4)  ) Recent Results (from the past 240 hours)  Blood culture (routine x 2)     Status: None (Preliminary result)   Collection Time: 02/29/24  7:11 PM   Specimen: BLOOD  Result Value Ref Range Status   Specimen Description BLOOD SITE NOT SPECIFIED  Final   Special Requests   Final    BOTTLES DRAWN AEROBIC AND ANAEROBIC Blood Culture results may not be optimal due to an inadequate volume of blood received in culture bottles   Culture   Final    NO GROWTH < 12 HOURS Performed at [facility], [address] [place], [place] [address]    Report Status PENDING  Incomplete  Blood culture (routine x 2)     Status: None (Preliminary result)   Collection Time: 02/29/24  7:14 PM   Specimen: BLOOD  Result Value Ref Range Status   Specimen Description BLOOD SITE NOT SPECIFIED  Final   Special Requests   Final    BOTTLES DRAWN AEROBIC AND ANAEROBIC Blood Culture results may not be optimal due to an inadequate volume of blood received in culture bottles   Culture   Final    NO GROWTH < 12 HOURS Performed at [facility], [address] [place], [place] [address]    Report Status PENDING  Incomplete     Radiology Studies: VAS US  LOWER EXTREMITY VENOUS (DVT) (7a-7p) Result Date: 03/01/2024  Lower Venous DVT Study Patient Name:  Larry Lutz  Date of Exam:   03/01/2024 Medical Rec #: [id number]    Accession #:    [id number] Date of Birth: 02-17-1991   Patient Gender: M Patient Age:   66 years [facility] Procedure:      VAS US  LOWER EXTREMITY VENOUS (DVT) Referring Phys: LYNWOOD TEMPLETON --------------------------------------------------------------------------------  Indications: Swelling.  Risk Factors: None identified. Limitations: Poor ultrasound/tissue interface and patient pain tolerance. Comparison Study: No prior studies. Performing Technologist: Cordella [name] RVT  Examination Guidelines: A complete evaluation includes B-mode imaging, spectral Doppler, color Doppler, and power Doppler as needed of all accessible portions of each vessel. Bilateral testing is considered an integral part of a complete examination. Limited examinations for reoccurring indications may be performed as noted. The reflux portion of the exam is performed with the patient in reverse Trendelenburg.  +-----+---------------+---------+-----------+----------+--------------+ RIGHTCompressibilityPhasicitySpontaneityPropertiesThrombus Aging +-----+---------------+---------+-----------+----------+--------------+ CFV  Full  Yes      Yes                                 +-----+---------------+---------+-----------+----------+--------------+   +---------+---------------+---------+-----------+----------+--------------+ LEFT     CompressibilityPhasicitySpontaneityPropertiesThrombus Aging +---------+---------------+---------+-----------+----------+--------------+ CFV      Full           Yes      Yes                                 +---------+---------------+---------+-----------+----------+--------------+ SFJ      Full                                                        +---------+---------------+---------+-----------+----------+--------------+ FV Prox  Full                                                         +---------+---------------+---------+-----------+----------+--------------+ FV Mid   Full                                                        +---------+---------------+---------+-----------+----------+--------------+ FV DistalFull                                                        +---------+---------------+---------+-----------+----------+--------------+ PFV      Full                                                        +---------+---------------+---------+-----------+----------+--------------+ POP      Full           Yes      Yes                                 +---------+---------------+---------+-----------+----------+--------------+ PTV      Full                                                        +---------+---------------+---------+-----------+----------+--------------+ PERO     Full                                                        +---------+---------------+---------+-----------+----------+--------------+    Summary:  RIGHT: - No evidence of common femoral vein obstruction.   LEFT: - There is no evidence of deep vein thrombosis in the lower extremity.  - No cystic structure found in the popliteal fossa.  *See table(s) above for measurements and observations.    Preliminary    CT ABDOMEN PELVIS W CONTRAST Result Date: 02/29/2024 EXAM: CT ABDOMEN AND PELVIS WITH CONTRAST 02/29/2024 09:00:00 PM TECHNIQUE: CT of the abdomen and pelvis was performed with the administration of 75 mL of iohexol  (OMNIPAQUE ) 350 MG/ML injection. Multiplanar reformatted images are provided for review. Automated exposure control, iterative reconstruction, and/or weight-based adjustment of the mA/kV was utilized to reduce the radiation dose to as low as reasonably achievable. COMPARISON: 05/01/2018 CLINICAL HISTORY: Possible subcutaneous abscess. FINDINGS: LIVER: The liver is unremarkable. GALLBLADDER AND BILE DUCTS: Gallbladder is unremarkable. No biliary ductal  dilatation. SPLEEN: No acute abnormality. PANCREAS: No acute abnormality. ADRENAL GLANDS: No acute abnormality. KIDNEYS, URETERS AND BLADDER: No stones in the kidneys or ureters. No hydronephrosis. No perinephric or periureteral stranding. Urinary bladder is unremarkable. GI AND BOWEL: Stomach demonstrates no acute abnormality. There is no bowel obstruction. The appendix is within normal limits. PERITONEUM AND RETROPERITONEUM: No ascites. No free air. VASCULATURE: Aorta is normal in caliber. LYMPH NODES: No lymphadenopathy. REPRODUCTIVE ORGANS: No acute abnormality. BONES AND SOFT TISSUES: Bony structures are within normal limits. No focal soft tissue abnormality. IMPRESSION: 1. No acute abnormality in the abdomen or pelvis, and no drainable fluid collection identified. Electronically signed by: Oneil Devonshire MD 02/29/2024 09:29 PM EST RP Workstation: [id number]   CT Angio Chest PE W and/or Wo Contrast Result Date: 02/29/2024 EXAM: CTA CHEST 02/29/2024 09:00:00 PM TECHNIQUE: CTA of the chest was performed without and with the administration of 75 mL of iohexol  (OMNIPAQUE ) 350 MG/ML injection. Multiplanar reformatted images are provided for review. MIP images are provided for review. Automated exposure control, iterative reconstruction, and/or weight based adjustment of the mA/kV was utilized to reduce the radiation dose to as low as reasonably achievable. COMPARISON: Chest x-ray from earlier in the same day. CLINICAL HISTORY: Shortness of breath. FINDINGS: PULMONARY ARTERIES: The pulmonary artery is well visualized with a normal branching pattern. No intraluminal filling defect to suggest pulmonary embolism is noted. Main pulmonary artery is normal in caliber. MEDIASTINUM: No cardiac enlargement is noted. The pericardium demonstrates no acute abnormality. The thoracic aorta is unremarkable. LYMPH NODES: No mediastinal, hilar or axillary lymphadenopathy. LUNGS AND PLEURA: The lungs are well aerated bilaterally. A  small 6 mm nodule is noted in the right middle lobe anteriorly, best seen on image number 98 and 99 of series 5. No focal consolidation or pulmonary edema. No evidence of pleural effusion or pneumothorax. UPPER ABDOMEN: Limited images of the upper abdomen are unremarkable. SOFT TISSUES AND BONES: Bony structures are within normal limits. No acute soft tissue abnormality. IMPRESSION: 1. No evidence of pulmonary embolism. 2. Small 6 mm nodule in the right middle lobe anteriorly.Per Fleischner Society Guidelines recommend a non-contrast chest CT at 6-12 months, then consider an additional non-contrast chest CT at 18-24 months. Electronically signed by: Oneil Devonshire MD 02/29/2024 09:26 PM EST RP Workstation: [id number]   DG Chest 2 View Result Date: 02/29/2024 CLINICAL DATA:  Shortness of breath. EXAM: CHEST - 2 VIEW COMPARISON:  Chest radiograph dated 06/01/2016. FINDINGS: A mild central vascular congestion. No focal consolidation, pleural effusion or pneumothorax. The cardiac silhouette is within normal limits. No acute osseous pathology. IMPRESSION: Mild central vascular congestion. No focal consolidation. Electronically Signed  By: Vanetta Chou M.D.   On: 02/29/2024 19:58     Scheduled Meds:  bictegravir-emtricitabine -tenofovir  AF  1 tablet Oral Daily   folic acid   1 mg Oral Daily   heparin   5,000 Units Subcutaneous Q8H   multivitamin with minerals  1 tablet Oral Daily   Continuous Infusions:  ceFEPime  (MAXIPIME ) IV Stopped (03/01/24 0705)   lactated ringers  Stopped (03/01/24 9287)   vancomycin        LOS: 1 day    Time spent:    Sigurd Pac, MD [facility]   03/01/2024, 11:47 AM

## 2024-03-01 NOTE — Plan of Care (Signed)
°  Problem: Education: Goal: Knowledge of General Education information will improve Description: Including pain rating scale, medication(s)/side effects and non-pharmacologic comfort measures Outcome: Progressing   Problem: Health Behavior/Discharge Planning: Goal: Ability to manage health-related needs will improve Outcome: Progressing   Problem: Clinical Measurements: Goal: Ability to maintain clinical measurements within normal limits will improve Outcome: Progressing Goal: Diagnostic test results will improve Outcome: Progressing Goal: Respiratory complications will improve Outcome: Progressing Goal: Cardiovascular complication will be avoided Outcome: Progressing   Problem: Nutrition: Goal: Adequate nutrition will be maintained Outcome: Progressing   Problem: Coping: Goal: Level of anxiety will decrease Outcome: Progressing   Problem: Elimination: Goal: Will not experience complications related to bowel motility Outcome: Progressing Goal: Will not experience complications related to urinary retention Outcome: Progressing   Problem: Pain Managment: Goal: General experience of comfort will improve and/or be controlled Outcome: Progressing   Problem: Safety: Goal: Ability to remain free from injury will improve Outcome: Progressing   Problem: Skin Integrity: Goal: Risk for impaired skin integrity will decrease Outcome: Progressing   Problem: Clinical Measurements: Goal: Will remain free from infection Outcome: Not Progressing   Problem: Activity: Goal: Risk for activity intolerance will decrease Outcome: Not Progressing

## 2024-03-01 NOTE — Evaluation (Signed)
 Physical Therapy Evaluation Patient Details Name: Larry Lutz MRN: [id number] DOB: June 09, 1990 Today's Date: 03/01/2024  History of Present Illness  33 y.o. male admitted 02/29/24 with a few weeks of BLE abscesses, edema, pain (LLE > RLE); workup for cellulitis/folliculitis. Of note, admit 10/2023 with same issue of MRSA folliculitis/cellulitis. Other PMH includes AIDS, HIV, anemia, ulcerative colitis.   Clinical Impression  Pt presents with an overall decrease in functional mobility secondary to above. PTA, pt independent, has been staying in hotels; reports family coming to visit for d/c and he will likely go to hotel. Today, pt requiring RW to ambulate due to LLE pain limiting weight bearing; pt also with L knee abscess bleeding during session (RN notified). Expect pt to progress well with mobility as pain improves. Will follow acutely to address established goals.      If plan is discharge home, recommend the following: Assistance with cooking/housework;Assist for transportation   Can travel by private vehicle    Yes    Equipment Recommendations Rolling walker (2 wheels)  Recommendations for Other Services   Mobility Specialist   Functional Status Assessment Patient has had a recent decline in their functional status and demonstrates the ability to make significant improvements in function in a reasonable and predictable amount of time.     Precautions / Restrictions Precautions Precautions: Fall Recall of Precautions/Restrictions: Intact Restrictions Weight Bearing Restrictions Per Provider Order: No      Mobility  Bed Mobility Overal bed mobility: Modified Independent                  Transfers Overall transfer level: Modified independent Equipment used: Rolling walker (2 wheels)                    Ambulation/Gait Ambulation/Gait assistance: Contact guard assist Gait Distance (Feet): 24 Feet Assistive device: Rolling walker (2 wheels) Gait  Pattern/deviations: Step-through pattern, Knee flexed in stance - left, Antalgic, Decreased weight shift to left, Trunk flexed Gait velocity: Decreased     General Gait Details: antalgic gait with RW, pt with minimal WB through LLE due to pain, opting to hop on RLE without issue, CGA for balance; further distance limited by L knee bleeding from abscess  Stairs            Wheelchair Mobility     Tilt Bed    Modified Rankin (Stroke Patients Only)       Balance Overall balance assessment: Needs assistance Sitting-balance support: No upper extremity supported, Feet supported Sitting balance-Leahy Scale: Normal     Standing balance support: No upper extremity supported Standing balance-Leahy Scale: Fair Standing balance comment: able to void standing at toilet without UE support; use of RW to offload painful LLE                             Pertinent Vitals/Pain Pain Assessment Pain Assessment: Faces Faces Pain Scale: Hurts little more Pain Location: LLE Pain Descriptors / Indicators: Heaviness, Guarding, Sore Pain Intervention(s): Monitored during session, Limited activity within patient's tolerance    Home Living Family/patient expects to be discharged to:: Shelter/Homeless                   Additional Comments: recently released from jail, has been staying in hotels; reports family coming from [place] to visit and hopefully able to get him another hotel    Prior Function Prior Level of Function : Independent/Modified Independent  Mobility Comments: typically independent without DME, walks or takes the bus, currently looking for employment ADLs Comments: independent     Extremity/Trunk Assessment   Upper Extremity Assessment Upper Extremity Assessment: Overall WFL for tasks assessed    Lower Extremity Assessment Lower Extremity Assessment: LLE deficits/detail LLE Deficits / Details: limited LLE weight bearing secondary to  pain/soreness around abscesses L great toe, knee, anterior thigh; functional observed strength >3/5    Cervical / Trunk Assessment Cervical / Trunk Assessment: Normal  Communication   Communication Communication: No apparent difficulties    Cognition Arousal: Alert Behavior During Therapy: WFL for tasks assessed/performed   PT - Cognitive impairments: No apparent impairments                         Following commands: Intact       Cueing Cueing Techniques: Verbal cues     General Comments General comments (skin integrity, edema, etc.): educ re: role of acute PT, POC, activity recommendations, LLE AROM/therex while admitted, DME use (crutches vs RW); pt interested in RW if able to d/c home with one. pt bumping L knee on RW during session causing abscess to bleed limiting further mobility, bleeding stopped with light pressure and leg elevated in bed; RN notified    Exercises     Assessment/Plan    PT Assessment Patient needs continued PT services  PT Problem List Decreased activity tolerance;Decreased balance;Decreased mobility;Pain;Decreased knowledge of use of DME       PT Treatment Interventions DME instruction;Gait training;Stair training;Functional mobility training;Therapeutic activities;Therapeutic exercise;Balance training;Patient/family education    PT Goals (Current goals can be found in the Care Plan section)  Acute Rehab PT Goals Patient Stated Goal: decreased pain PT Goal Formulation: With patient Time For Goal Achievement: 03/15/24 Potential to Achieve Goals: Good    Frequency Min 1X/week     Co-evaluation               AM-PAC PT 6 Clicks Mobility  Outcome Measure Help needed turning from your back to your side while in a flat bed without using bedrails?: None Help needed moving from lying on your back to sitting on the side of a flat bed without using bedrails?: None Help needed moving to and from a bed to a chair (including a  wheelchair)?: None Help needed standing up from a chair using your arms (e.g., wheelchair or bedside chair)?: None Help needed to walk in hospital room?: A Little Help needed climbing 3-5 steps with a railing? : A Little 6 Click Score: 22    End of Session   Activity Tolerance: Patient tolerated treatment well Patient left: in bed;with call bell/phone within reach;with bed alarm set Nurse Communication: Mobility status PT Visit Diagnosis: Pain;Other abnormalities of gait and mobility (R26.89) Pain - Right/Left: Left Pain - part of body: Leg    Time: 1529-1550 PT Time Calculation (min) (ACUTE ONLY): 21 min   Charges:   PT Evaluation $PT Eval Low Complexity: 1 Low   PT General Charges $$ ACUTE PT VISIT: 1 Visit       Darice Almas, PT, DPT Acute Rehabilitation Services  Personal: Secure Chat Rehab Office: [phone]  Darice LITTIE Almas 03/01/2024, 5:05 PM

## 2024-03-01 NOTE — ED Notes (Signed)
 Pt in bed, meal tray given, pt c/o bilateral leg pain, pt states that his pain is a 9/10, pt has multiple leg wounds

## 2024-03-01 NOTE — Progress Notes (Signed)
 Pharmacy Antibiotic Note  Larry Lutz is a 33 y.o. male admitted on 02/29/2024 with skin lesions on lower extremities/groin area.  Pharmacy has been consulted for vancomycin  dosing for cellulitis.  Plan: -Cefepime  2g IV every 8 hours --Vancomycin  to 1000mg  IV every 12 hours (AUC , V17d 0.72, IBW, sCr 1.04) -Monitor renal function -Follow up signs of clinical improvement, LOT, de-escalation of antibiotics   Height: 5' 4 (162.6 cm) Weight: 72.6 kg (160 lb) IBW/kg (Calculated) : 59.2  Temp (24hrs), Avg:100.1 F (37.8 C), Min:98.9 F (37.2 C), Max:103.1 F (39.5 C)  Recent Labs  Lab 02/29/24 1911 02/29/24 1931 02/29/24 2141 02/29/24 2319 03/01/24 0007  WBC 10.8*  --   --   --  8.7  CREATININE 1.26*  --   --   --  1.04  LATICACIDVEN  --  3.6* 5.4* 0.8  --     Estimated Creatinine Clearance: 91.4 mL/min (by C-G formula based on SCr of 1.04 mg/dL).    Allergies[1]  Antimicrobials this admission: Cefepime  12/17 >>  Vancomycin  12/17 >>    Microbiology results: 12/17 BCx:   Thank you for allowing pharmacy to be a part of this patients care.  Vito Ralph, PharmD, BCPS Please see amion for complete clinical pharmacist phone list 03/01/2024 9:35 AM        [1] No Known Allergies

## 2024-03-01 NOTE — Consult Note (Signed)
 Consult Note  Larry Lutz [birth year]  [id number].    Requesting MD:  Dr. Sigurd Pac, MD  Chief Complaint/Reason for Consult:  Left thigh abscess  HPI:  Larry Lutz is a 33 year old male with a PMH of HIV on Biktarvy , UC, hx of MRSA folliculitis/cellulitis and HTN that presented to the ED due to concerns of pustules on his lower extremities.   WBC noted to be 10.8 on 12/17. HGB 10.6. CMP with abnormalities of chloride 97, Creatinine 1.26, total protein 8.4.   Patient initiated on antibiotics.   General surgery was consulted for possible I&D regarding a left thigh abscess.   Patient states that he noticed the left thigh abscess about two days ago. The area is erythematous, painful, and has had some clear drainage. He reports that he has had these previously but has never had to have them drainage. He reports they normally drain spontaneously.  Anticoagulation medications: Denies Tobacco use: Former smoker Alcohol use: Denies Illicit drug use: Not currently Allergies: NKDA  ROS: Per HPI  Family History  Problem Relation Age of Onset   Ulcerative colitis Mother    Irritable bowel syndrome Mother    Prostate cancer Paternal Grandfather    Diabetes Paternal Aunt    Colon cancer Neg Hx    Esophageal cancer Neg Hx    Rectal cancer Neg Hx    Stomach cancer Neg Hx     Past Medical History:  Diagnosis Date   Anxiety    Asthma    Colitis    Colon polyp    Crohn disease (HCC)    Depressed    GI bleed    HIV (human immunodeficiency virus infection) (HCC)    Hypertension    IBS (irritable bowel syndrome)    UC (ulcerative colitis) (HCC)     Past Surgical History:  Procedure Laterality Date   BRAIN SURGERY     COLONOSCOPY     in [place]  around 2017 or 2018     Social History:  reports that he has quit smoking. His smoking use included cigarettes. He has never used smokeless tobacco. He reports that he does not currently use alcohol. He reports that he  does not currently use drugs after having used the following drugs: Marijuana, Methamphetamines, and Crack cocaine.  Allergies: Allergies[1]  (Not in a hospital admission)   Blood pressure 116/78, pulse 94, temperature 98.8 F (37.1 C), temperature source Oral, resp. rate 14, height 5' 4 (1.626 m), weight 72.6 kg, SpO2 100%. Physical Exam:  General: Pleasant male who is laying in bed in NAD. HEENT: Head is normocephalic, atraumatic. Sclera are noninjected. EOMI. Ears and nose without any masses or lesions. Mouth is pink and moist. Heart: HR normal during encounter. Normal s1,s2. No obvious murmurs, gallops, or rubs noted.  Palpable radial and pedal pulses bilaterally Lungs: CTAB, no wheezes, rhonchi, or rales noted.  Respiratory effort nonlabored Abd: ***soft, NT, ND, +BS, no masses, hernias, or organomegaly MS: all 4 extremities are symmetrical with no cyanosis, clubbing, or edema. Skin: warm and dry with no masses, lesions, or rashes Neuro: Cranial nerves 2-12 grossly intact, sensation is normal throughout Psych: A&Ox3 with an appropriate affect.   Results for orders placed or performed during the hospital encounter of 02/29/24 (from the past 48 hours)  CBC with Differential     Status: Abnormal   Collection Time: 02/29/24  7:11 PM  Result Value Ref Range   WBC 10.8 (H) 4.0 - 10.5 K/uL  RBC 4.83 4.22 - 5.81 MIL/uL   Hemoglobin 10.6 (L) 13.0 - 17.0 g/dL   HCT 64.5 (L) 60.9 - 47.9 %   MCV 73.3 (L) 80.0 - 100.0 fL   MCH 21.9 (L) 26.0 - 34.0 pg   MCHC 29.9 (L) 30.0 - 36.0 g/dL   RDW 82.5 (H) 88.4 - 84.4 %   Platelets 367 150 - 400 K/uL   nRBC 0.0 0.0 - 0.2 %   Neutrophils Relative % 69 %   Neutro Abs 7.5 1.7 - 7.7 K/uL   Lymphocytes Relative 18 %   Lymphs Abs 2.0 0.7 - 4.0 K/uL   Monocytes Relative 13 %   Monocytes Absolute 1.4 (H) 0.1 - 1.0 K/uL   Eosinophils Relative 0 %   Eosinophils Absolute 0.0 0.0 - 0.5 K/uL   Basophils Relative 0 %   Basophils Absolute 0.0 0.0 -  0.1 K/uL   Immature Granulocytes 0 %   Abs Immature Granulocytes 0.04 0.00 - 0.07 K/uL    Comment: Performed at [facility], [address] [place], [place] [address]  Comprehensive metabolic panel     Status: Abnormal   Collection Time: 02/29/24  7:11 PM  Result Value Ref Range   Sodium 137 135 - 145 mmol/L   Potassium 3.9 3.5 - 5.1 mmol/L   Chloride 97 (L) 98 - 111 mmol/L   CO2 29 22 - 32 mmol/L   Glucose, Bld 94 70 - 99 mg/dL    Comment: Glucose reference range applies only to samples taken after fasting for at least 8 hours.   BUN 16 6 - 20 mg/dL   Creatinine, Ser 8.73 (H) 0.61 - 1.24 mg/dL   Calcium 9.5 8.9 - 89.6 mg/dL   Total Protein 8.4 (H) 6.5 - 8.1 g/dL   Albumin 3.9 3.5 - 5.0 g/dL   AST 31 15 - 41 U/L   ALT 14 0 - 44 U/L   Alkaline Phosphatase 74 38 - 126 U/L   Total Bilirubin 0.5 0.0 - 1.2 mg/dL   GFR, Estimated >39 >39 mL/min    Comment: (NOTE) Calculated using the CKD-EPI Creatinine Equation (2021)    Anion gap 12 5 - 15    Comment: Performed at [facility], [address] [place], [place] [address]  Blood culture (routine x 2)     Status: None (Preliminary result)   Collection Time: 02/29/24  7:11 PM   Specimen: BLOOD  Result Value Ref Range   Specimen Description BLOOD SITE NOT SPECIFIED    Special Requests      BOTTLES DRAWN AEROBIC AND ANAEROBIC Blood Culture results may not be optimal due to an inadequate volume of blood received in culture bottles   Culture      NO GROWTH < 12 HOURS Performed at [facility], [address], [place], [place] [address]    Report Status PENDING   Blood culture (routine x 2)     Status: None (Preliminary result)   Collection Time: 02/29/24  7:14 PM   Specimen: BLOOD  Result Value Ref Range   Specimen Description BLOOD SITE NOT SPECIFIED    Special Requests      BOTTLES DRAWN AEROBIC AND ANAEROBIC Blood Culture results may not be optimal due to an inadequate volume of blood received in culture  bottles   Culture      NO GROWTH < 12 HOURS Performed at [facility], [address] [place], [place] [address]  Report Status PENDING   I-Stat CG4 Lactic Acid     Status: Abnormal   Collection Time: 02/29/24  7:31 PM  Result Value Ref Range   Lactic Acid, Venous 3.6 (HH) 0.5 - 1.9 mmol/L   Comment NOTIFIED PHYSICIAN   I-Stat CG4 Lactic Acid     Status: Abnormal   Collection Time: 02/29/24  9:41 PM  Result Value Ref Range   Lactic Acid, Venous 5.4 (HH) 0.5 - 1.9 mmol/L   Comment NOTIFIED PHYSICIAN   I-Stat CG4 Lactic Acid     Status: None   Collection Time: 02/29/24 11:19 PM  Result Value Ref Range   Lactic Acid, Venous 0.8 0.5 - 1.9 mmol/L  C-reactive protein     Status: Abnormal   Collection Time: 03/01/24 12:07 AM  Result Value Ref Range   CRP 9.3 (H) <1.0 mg/dL    Comment: Performed at [facility], [address] [place], [place] [address]  Procalcitonin     Status: None   Collection Time: 03/01/24 12:07 AM  Result Value Ref Range   Procalcitonin 0.13 ng/mL    Comment: (NOTE)   Sepsis PCT Algorithm          Lower Respiratory Tract Infection                                         PCT Algorithm -----------------------------------------------------------------  <0.5 ng/mL                    <0.10 ng/mL  Associated with low           Antibiotic therapy strongly   risk for progression          discouraged. Indicates absence   to severe sepsis              of bacteria infection  and/or septic shock             --------------------------------------------------------------  0.5-2.0 ng/mL                 0.10-0.25 ng/mL  Recommended to retest         Antibiotic therapy discouraged.  PCT within 6-24 hours         Bacterial infection unlikely  ------------------------------------------------------------  >2 ng/mL                      0.26-0.50 ng/mL  Associated with high risk     Antibiotic therapy encouraged.  for progression to severe     Bacterial  infection possible  sepsis/and or septic shock    ------------------------------                                 >0.50 ng/mL                                Antibiotic therapy strongly                                 encouraged.                                Suggestive of presence  of                                 bacterial infection.                                 -------------------------------------------------------------------  < or = 0.50 ng/mL OR          < or = 0.25 OR 80% decrease in PCT  80% decrease in PCT           Antibiotic therapy   Antibiotic therapy may        may be discontinued  be discontinued                                 Performed at [facility], [address], [place], [place] [address]   Comprehensive metabolic panel     Status: Abnormal   Collection Time: 03/01/24 12:07 AM  Result Value Ref Range   Sodium 133 (L) 135 - 145 mmol/L   Potassium 3.6 3.5 - 5.1 mmol/L   Chloride 98 98 - 111 mmol/L   CO2 23 22 - 32 mmol/L   Glucose, Bld 100 (H) 70 - 99 mg/dL    Comment: Glucose reference range applies only to samples taken after fasting for at least 8 hours.   BUN 12 6 - 20 mg/dL   Creatinine, Ser 8.95 0.61 - 1.24 mg/dL   Calcium 8.5 (L) 8.9 - 10.3 mg/dL   Total Protein 6.8 6.5 - 8.1 g/dL   Albumin 3.2 (L) 3.5 - 5.0 g/dL   AST 29 15 - 41 U/L    Comment: HEMOLYSIS AT THIS LEVEL MAY AFFECT RESULT   ALT 10 0 - 44 U/L   Alkaline Phosphatase 70 38 - 126 U/L   Total Bilirubin 0.4 0.0 - 1.2 mg/dL   GFR, Estimated >39 >39 mL/min    Comment: (NOTE) Calculated using the CKD-EPI Creatinine Equation (2021)    Anion gap 12 5 - 15    Comment: Performed at [facility], [address] [place], [place] [address]  Hemoglobin A1c     Status: None   Collection Time: 03/01/24 12:07 AM  Result Value Ref Range   Hgb A1c MFr Bld 5.6 4.8 - 5.6 %    Comment: (NOTE) Diagnosis of Diabetes The following HbA1c ranges recommended by the American Diabetes  Association (ADA) may be used as an aid in the diagnosis of diabetes mellitus.  Hemoglobin             Suggested A1C NGSP%              Diagnosis  <5.7                   Non Diabetic  5.7-6.4                Pre-Diabetic  >6.4                   Diabetic  <7.0                   Glycemic control for                       adults with diabetes.  Mean Plasma Glucose 114.02 mg/dL    Comment: Performed at [facility], [address] [place], [place] [address]  CBC with Differential/Platelet     Status: Abnormal   Collection Time: 03/01/24 12:07 AM  Result Value Ref Range   WBC 8.7 4.0 - 10.5 K/uL   RBC 4.08 (L) 4.22 - 5.81 MIL/uL   Hemoglobin 9.1 (L) 13.0 - 17.0 g/dL   HCT 69.6 (L) 60.9 - 47.9 %   MCV 74.3 (L) 80.0 - 100.0 fL   MCH 22.3 (L) 26.0 - 34.0 pg   MCHC 30.0 30.0 - 36.0 g/dL   RDW 82.2 (H) 88.4 - 84.4 %   Platelets 298 150 - 400 K/uL   nRBC 0.0 0.0 - 0.2 %   Neutrophils Relative % 68 %   Neutro Abs 5.8 1.7 - 7.7 K/uL   Lymphocytes Relative 21 %   Lymphs Abs 1.8 0.7 - 4.0 K/uL   Monocytes Relative 11 %   Monocytes Absolute 1.0 0.1 - 1.0 K/uL   Eosinophils Relative 0 %   Eosinophils Absolute 0.0 0.0 - 0.5 K/uL   Basophils Relative 0 %   Basophils Absolute 0.0 0.0 - 0.1 K/uL   Immature Granulocytes 0 %   Abs Immature Granulocytes 0.03 0.00 - 0.07 K/uL    Comment: Performed at [facility], [address], [place], [place] [address]   VAS US  LOWER EXTREMITY VENOUS (DVT) (7a-7p) Result Date: 03/01/2024  Lower Venous DVT Study Patient Name:  PROSPER PAFF  Date of Exam:   03/01/2024 Medical Rec #: [id number]   Accession #:    [id number] Date of Birth: 07-Dec-1990   Patient Gender: M Patient Age:   72 years Exam Location:  [facility] Procedure:      VAS US  LOWER EXTREMITY VENOUS (DVT) Referring Phys: LYNWOOD TEMPLETON --------------------------------------------------------------------------------  Indications: Swelling.  Risk Factors: None identified.  Limitations: Poor ultrasound/tissue interface and patient pain tolerance. Comparison Study: No prior studies. Performing Technologist: Cordella [name] RVT  Examination Guidelines: A complete evaluation includes B-mode imaging, spectral Doppler, color Doppler, and power Doppler as needed of all accessible portions of each vessel. Bilateral testing is considered an integral part of a complete examination. Limited examinations for reoccurring indications may be performed as noted. The reflux portion of the exam is performed with the patient in reverse Trendelenburg.  +-----+---------------+---------+-----------+----------+--------------+ RIGHTCompressibilityPhasicitySpontaneityPropertiesThrombus Aging +-----+---------------+---------+-----------+----------+--------------+ CFV  Full           Yes      Yes                                 +-----+---------------+---------+-----------+----------+--------------+   +---------+---------------+---------+-----------+----------+--------------+ LEFT     CompressibilityPhasicitySpontaneityPropertiesThrombus Aging +---------+---------------+---------+-----------+----------+--------------+ CFV      Full           Yes      Yes                                 +---------+---------------+---------+-----------+----------+--------------+ SFJ      Full                                                        +---------+---------------+---------+-----------+----------+--------------+ FV Prox  Full                                                        +---------+---------------+---------+-----------+----------+--------------+  FV Mid   Full                                                        +---------+---------------+---------+-----------+----------+--------------+ FV DistalFull                                                        +---------+---------------+---------+-----------+----------+--------------+ PFV      Full                                                         +---------+---------------+---------+-----------+----------+--------------+ POP      Full           Yes      Yes                                 +---------+---------------+---------+-----------+----------+--------------+ PTV      Full                                                        +---------+---------------+---------+-----------+----------+--------------+ PERO     Full                                                        +---------+---------------+---------+-----------+----------+--------------+    Summary: RIGHT: - No evidence of common femoral vein obstruction.   LEFT: - There is no evidence of deep vein thrombosis in the lower extremity.  - No cystic structure found in the popliteal fossa.  *See table(s) above for measurements and observations.    Preliminary    CT ABDOMEN PELVIS W CONTRAST Result Date: 02/29/2024 EXAM: CT ABDOMEN AND PELVIS WITH CONTRAST 02/29/2024 09:00:00 PM TECHNIQUE: CT of the abdomen and pelvis was performed with the administration of 75 mL of iohexol  (OMNIPAQUE ) 350 MG/ML injection. Multiplanar reformatted images are provided for review. Automated exposure control, iterative reconstruction, and/or weight-based adjustment of the mA/kV was utilized to reduce the radiation dose to as low as reasonably achievable. COMPARISON: 05/01/2018 CLINICAL HISTORY: Possible subcutaneous abscess. FINDINGS: LIVER: The liver is unremarkable. GALLBLADDER AND BILE DUCTS: Gallbladder is unremarkable. No biliary ductal dilatation. SPLEEN: No acute abnormality. PANCREAS: No acute abnormality. ADRENAL GLANDS: No acute abnormality. KIDNEYS, URETERS AND BLADDER: No stones in the kidneys or ureters. No hydronephrosis. No perinephric or periureteral stranding. Urinary bladder is unremarkable. GI AND BOWEL: Stomach demonstrates no acute abnormality. There is no bowel obstruction. The appendix is within normal limits. PERITONEUM AND  RETROPERITONEUM: No ascites. No free air. VASCULATURE: Aorta is normal in caliber. LYMPH NODES: No lymphadenopathy. REPRODUCTIVE ORGANS: No acute abnormality. BONES AND SOFT TISSUES: Bony structures are within normal  limits. No focal soft tissue abnormality. IMPRESSION: 1. No acute abnormality in the abdomen or pelvis, and no drainable fluid collection identified. Electronically signed by: Oneil Devonshire MD 02/29/2024 09:29 PM EST RP Workstation: [id number]   CT Angio Chest PE W and/or Wo Contrast Result Date: 02/29/2024 EXAM: CTA CHEST 02/29/2024 09:00:00 PM TECHNIQUE: CTA of the chest was performed without and with the administration of 75 mL of iohexol  (OMNIPAQUE ) 350 MG/ML injection. Multiplanar reformatted images are provided for review. MIP images are provided for review. Automated exposure control, iterative reconstruction, and/or weight based adjustment of the mA/kV was utilized to reduce the radiation dose to as low as reasonably achievable. COMPARISON: Chest x-ray from earlier in the same day. CLINICAL HISTORY: Shortness of breath. FINDINGS: PULMONARY ARTERIES: The pulmonary artery is well visualized with a normal branching pattern. No intraluminal filling defect to suggest pulmonary embolism is noted. Main pulmonary artery is normal in caliber. MEDIASTINUM: No cardiac enlargement is noted. The pericardium demonstrates no acute abnormality. The thoracic aorta is unremarkable. LYMPH NODES: No mediastinal, hilar or axillary lymphadenopathy. LUNGS AND PLEURA: The lungs are well aerated bilaterally. A small 6 mm nodule is noted in the right middle lobe anteriorly, best seen on image number 98 and 99 of series 5. No focal consolidation or pulmonary edema. No evidence of pleural effusion or pneumothorax. UPPER ABDOMEN: Limited images of the upper abdomen are unremarkable. SOFT TISSUES AND BONES: Bony structures are within normal limits. No acute soft tissue abnormality. IMPRESSION: 1. No evidence of pulmonary  embolism. 2. Small 6 mm nodule in the right middle lobe anteriorly.Per Fleischner Society Guidelines recommend a non-contrast chest CT at 6-12 months, then consider an additional non-contrast chest CT at 18-24 months. Electronically signed by: Oneil Devonshire MD 02/29/2024 09:26 PM EST RP Workstation: [id number]   DG Chest 2 View Result Date: 02/29/2024 CLINICAL DATA:  Shortness of breath. EXAM: CHEST - 2 VIEW COMPARISON:  Chest radiograph dated 06/01/2016. FINDINGS: A mild central vascular congestion. No focal consolidation, pleural effusion or pneumothorax. The cardiac silhouette is within normal limits. No acute osseous pathology. IMPRESSION: Mild central vascular congestion. No focal consolidation. Electronically Signed   By: Vanetta Chou M.D.   On: 02/29/2024 19:58      Assessment/Plan ***  Admit to ***  FEN: VTE: ID:    I reviewed {Reviewed data:26882::last 24 h vitals and pain scores,last 48 h intake and output,last 24 h labs and trends,last 24 h imaging results}.  This care required {MDM levels:26883} level of medical decision making.   Marjorie Carlyon Favre, [facility] 03/01/2024, 12:58 PM Please see Amion for pager number during day hours 7:00am-4:30pm      [1] No Known Allergies

## 2024-03-01 NOTE — Progress Notes (Signed)
° ° °  EXPEDITER LEVEL LOADING ASSESSMENT NOTE  Patient Name: Larry Lutz  DOB:[birth year] Date of Admission: 02/29/2024  Date of Assessment:03/01/2024   -------------------------------------------------------------------------------------------------------------------   Brief clinical summary: 33 y.o. male who presents to ED with complaint of few weeks of pustules on his lower extremities as well as anus  Is there Bed Availability at another [facility] Facility? Yes  If yes, what facility: [facility]  Level of Care Needed:  Yes  MD Agree to transfer: No  Patient agrees to transfer: N/A    -------------------------------------------------------------------------------------------------------------------  [facility] RN Expediter, Larry Lutz Larry Lutz Please contact us  directly via secure chat (search for [facility]) or by calling us  at [phone] [facility]).

## 2024-03-02 DIAGNOSIS — L03119 Cellulitis of unspecified part of limb: Secondary | ICD-10-CM | POA: Diagnosis not present

## 2024-03-02 LAB — CBC
HCT: 30.2 % — ABNORMAL LOW (ref 39.0–52.0)
Hemoglobin: 9.2 g/dL — ABNORMAL LOW (ref 13.0–17.0)
MCH: 21.8 pg — ABNORMAL LOW (ref 26.0–34.0)
MCHC: 30.5 g/dL (ref 30.0–36.0)
MCV: 71.6 fL — ABNORMAL LOW (ref 80.0–100.0)
Platelets: 332 K/uL (ref 150–400)
RBC: 4.22 MIL/uL (ref 4.22–5.81)
RDW: 17.5 % — ABNORMAL HIGH (ref 11.5–15.5)
WBC: 7.2 K/uL (ref 4.0–10.5)
nRBC: 0 % (ref 0.0–0.2)

## 2024-03-02 LAB — BASIC METABOLIC PANEL WITH GFR
Anion gap: 10 (ref 5–15)
BUN: 9 mg/dL (ref 6–20)
CO2: 25 mmol/L (ref 22–32)
Calcium: 8.3 mg/dL — ABNORMAL LOW (ref 8.9–10.3)
Chloride: 98 mmol/L (ref 98–111)
Creatinine, Ser: 1.14 mg/dL (ref 0.61–1.24)
GFR, Estimated: >60 mL/min
Glucose, Bld: 89 mg/dL (ref 70–99)
Potassium: 3.9 mmol/L (ref 3.5–5.1)
Sodium: 132 mmol/L — ABNORMAL LOW (ref 135–145)

## 2024-03-02 LAB — MRSA NEXT GEN BY PCR, NASAL: MRSA by PCR Next Gen: DETECTED — AB

## 2024-03-02 LAB — T-HELPER CELLS (CD4) COUNT (NOT AT ARMC)
CD4 % Helper T Cell: 11 % — ABNORMAL LOW (ref 33–65)
CD4 T Cell Abs: 202 /uL — ABNORMAL LOW (ref 400–1790)

## 2024-03-02 MED ORDER — CHLORHEXIDINE GLUCONATE CLOTH 2 % EX PADS
6.0000 | MEDICATED_PAD | Freq: Every day | CUTANEOUS | Status: AC
  Administered 2024-03-02 – 2024-03-06 (×5): 6 via TOPICAL

## 2024-03-02 MED ORDER — MUPIROCIN 2 % EX OINT
1.0000 | TOPICAL_OINTMENT | Freq: Two times a day (BID) | CUTANEOUS | Status: DC
  Administered 2024-03-02 – 2024-03-06 (×6): 1 via NASAL
  Filled 2024-03-02 (×3): qty 22

## 2024-03-02 MED ORDER — HYDROMORPHONE HCL 1 MG/ML IJ SOLN
1.0000 mg | Freq: Once | INTRAMUSCULAR | Status: AC
  Administered 2024-03-02: 1 mg via INTRAVENOUS
  Filled 2024-03-02: qty 1

## 2024-03-02 MED ORDER — ACETAMINOPHEN 325 MG PO TABS
650.0000 mg | ORAL_TABLET | Freq: Four times a day (QID) | ORAL | Status: DC | PRN
  Administered 2024-03-02: 650 mg via ORAL
  Filled 2024-03-02 (×2): qty 2

## 2024-03-02 NOTE — Progress Notes (Signed)
 Pt. Has mixture of blood and serous fluid drainage of the left knee and thigh, this nurse cleans and covered with gauze and foam

## 2024-03-02 NOTE — Plan of Care (Signed)
   Problem: Education: Goal: Knowledge of General Education information will improve Description: Including pain rating scale, medication(s)/side effects and non-pharmacologic comfort measures Outcome: Progressing   Problem: Health Behavior/Discharge Planning: Goal: Ability to manage health-related needs will improve Outcome: Progressing   Problem: Activity: Goal: Risk for activity intolerance will decrease Outcome: Progressing

## 2024-03-02 NOTE — Progress Notes (Signed)
 " PROGRESS NOTE    Larry Lutz  FMW:[id number] DOB: 06-28-1990 DOA: 02/29/2024 PCP: Patient, No Pcp Per   33/M w HIV on Biktarvy , Ulcerative colitis , hx of MRSA folliculitis/cellulitis, Hypertension presented to ED with complaint of few weeks of pustules on his lower extremities. He notes symptoms have progressed with his left limb being worse affected. He notes he has tender red area over his knee in the area where a pustule recently ruptured. He also notes area mid lateral thigh that is warm erythematous and tender, he was just released from jail a few weeks ago. -In the ED, afebrile, mild leukocytosis, lactate 3.6 and then 5.4, repeat improved,, also had a CT chest and abdomen pelvis in the ER which was unremarkable except for lung nodule - General Surgery consulted, status post I&D  Subjective: -Has pain at the site of his thigh abscess, overall better today  Assessment and Plan:  Left thigh, superficial knee abscess, folliculitis History of MRSA -Continue IV vancomycin , will DC Unasyn , follow-up culture and sensitivity - Appreciate surgical eval, sp I&D  HIV - CD4 count<100 in August 2025, was noncompliant with HAART then - History of alleged compliance since 8/25 onwards - Will discuss with ID - Follow-up CD4 count  Chronic anemia - Likely secondary to above, monitor  Pulmonary nodule - Needs follow-up   DVT prophylaxis: Heparin  subcutaneous Code Status: Full code Family Communication: None present Disposition Plan: Home in 1 to 2 days  Consultants: No surgery   Objective: Vitals:   03/01/24 1501 03/01/24 1940 03/02/24 0401 03/02/24 0729  BP: 134/85 111/68 (!) 107/59 127/67  Pulse: 95 97 94 76  Resp: 18 17 19 16   Temp: (!) 100.5 F (38.1 C) 98.4 F (36.9 C) (!) 100.4 F (38 C) 98.7 F (37.1 C)  TempSrc: Oral  Oral   SpO2: 99% 97% 96% 96%  Weight:      Height:        Intake/Output Summary (Last 24 hours) at 03/02/2024 1152 Last data filed at 03/02/2024  0700 Gross per 24 hour  Intake 1141.6 ml  Output 1100 ml  Net 41.6 ml   Filed Weights   02/29/24 1658  Weight: 72.6 kg    Examination:  General exam: Appears calm and comfortable, AAO x 3 Respiratory system: Clear to auscultation Cardiovascular system: S1 & S2 heard, RRR.  Abd: nondistended, soft and nontender.Normal bowel sounds heard. Central nervous system: Alert and oriented. No focal neurological deficits. Extremities: no edema Skin: Abscess in thigh and left knee draining purulence Psychiatry:  Mood & affect appropriate.     Data Reviewed:   CBC: Recent Labs  Lab 02/29/24 1911 03/01/24 0007 03/02/24 0413  WBC 10.8* 8.7 7.2  NEUTROABS 7.5 5.8  --   HGB 10.6* 9.1* 9.2*  HCT 35.4* 30.3* 30.2*  MCV 73.3* 74.3* 71.6*  PLT 367 298 332   Basic Metabolic Panel: Recent Labs  Lab 02/29/24 1911 03/01/24 0007 03/02/24 0413  NA 137 133* 132*  K 3.9 3.6 3.9  CL 97* 98 98  CO2 29 23 25   GLUCOSE 94 100* 89  BUN 16 12 9   CREATININE 1.26* 1.04 1.14  CALCIUM 9.5 8.5* 8.3*   GFR: Estimated Creatinine Clearance: 83.4 mL/min (by C-G formula based on SCr of 1.14 mg/dL). Liver Function Tests: Recent Labs  Lab 02/29/24 1911 03/01/24 0007  AST 31 29  ALT 14 10  ALKPHOS 74 70  BILITOT 0.5 0.4  PROT 8.4* 6.8  ALBUMIN 3.9 3.2*  No results for input(s): LIPASE, AMYLASE in the last 168 hours. No results for input(s): AMMONIA in the last 168 hours. Coagulation Profile: No results for input(s): INR, PROTIME in the last 168 hours. Cardiac Enzymes: No results for input(s): CKTOTAL, CKMB, CKMBINDEX, TROPONINI in the last 168 hours. BNP (last 3 results) No results for input(s): PROBNP in the last 8760 hours. HbA1C: Recent Labs    03/01/24 0007  HGBA1C 5.6   CBG: No results for input(s): GLUCAP in the last 168 hours. Lipid Profile: No results for input(s): CHOL, HDL, LDLCALC, TRIG, CHOLHDL, LDLDIRECT in the last 72  hours. Thyroid Function Tests: No results for input(s): TSH, T4TOTAL, FREET4, T3FREE, THYROIDAB in the last 72 hours. Anemia Panel: No results for input(s): VITAMINB12, FOLATE, FERRITIN, TIBC, IRON, RETICCTPCT in the last 72 hours. Urine analysis:    Component Value Date/Time   COLORURINE YELLOW 05/03/2018 1325   APPEARANCEUR CLEAR 05/03/2018 1325   LABSPEC 1.011 05/03/2018 1325   PHURINE 8.0 05/03/2018 1325   GLUCOSEU NEGATIVE 05/03/2018 1325   HGBUR SMALL (A) 05/03/2018 1325   BILIRUBINUR NEGATIVE 05/03/2018 1325   KETONESUR NEGATIVE 05/03/2018 1325   PROTEINUR NEGATIVE 05/03/2018 1325   NITRITE NEGATIVE 05/03/2018 1325   LEUKOCYTESUR NEGATIVE 05/03/2018 1325   Sepsis Labs: @LABRCNTIP (procalcitonin:4,lacticidven:4)  ) Recent Results (from the past 240 hours)  Blood culture (routine x 2)     Status: None (Preliminary result)   Collection Time: 02/29/24  7:11 PM   Specimen: BLOOD  Result Value Ref Range Status   Specimen Description BLOOD SITE NOT SPECIFIED  Final   Special Requests   Final    BOTTLES DRAWN AEROBIC AND ANAEROBIC Blood Culture results may not be optimal due to an inadequate volume of blood received in culture bottles   Culture   Final    NO GROWTH 2 DAYS Performed at [facility], [address] [place], [place] [address]    Report Status PENDING  Incomplete  Blood culture (routine x 2)     Status: None (Preliminary result)   Collection Time: 02/29/24  7:14 PM   Specimen: BLOOD  Result Value Ref Range Status   Specimen Description BLOOD SITE NOT SPECIFIED  Final   Special Requests   Final    BOTTLES DRAWN AEROBIC AND ANAEROBIC Blood Culture results may not be optimal due to an inadequate volume of blood received in culture bottles   Culture   Final    NO GROWTH 2 DAYS Performed at [facility], [address], [place], [place] [address]    Report Status PENDING  Incomplete     Radiology Studies: VAS US  LOWER  EXTREMITY VENOUS (DVT) (7a-7p) Result Date: 03/01/2024  Lower Venous DVT Study Patient Name:  Larry Lutz  Date of Exam:   03/01/2024 Medical Rec #: [id number]   Accession #:    [id number] Date of Birth: Mar 18, 1990   Patient Gender: M Patient Age:   63 years Exam Location:  [facility] Procedure:      VAS US  LOWER EXTREMITY VENOUS (DVT) Referring Phys: LYNWOOD TEMPLETON --------------------------------------------------------------------------------  Indications: Swelling.  Risk Factors: None identified. Limitations: Poor ultrasound/tissue interface and patient pain tolerance. Comparison Study: No prior studies. Performing Technologist: Cordella [name] RVT  Examination Guidelines: A complete evaluation includes B-mode imaging, spectral Doppler, color Doppler, and power Doppler as needed of all accessible portions of each vessel. Bilateral testing is considered an integral part of a complete examination. Limited examinations for reoccurring indications may be performed  as noted. The reflux portion of the exam is performed with the patient in reverse Trendelenburg.  +-----+---------------+---------+-----------+----------+--------------+ RIGHTCompressibilityPhasicitySpontaneityPropertiesThrombus Aging +-----+---------------+---------+-----------+----------+--------------+ CFV  Full           Yes      Yes                                 +-----+---------------+---------+-----------+----------+--------------+   +---------+---------------+---------+-----------+----------+--------------+ LEFT     CompressibilityPhasicitySpontaneityPropertiesThrombus Aging +---------+---------------+---------+-----------+----------+--------------+ CFV      Full           Yes      Yes                                 +---------+---------------+---------+-----------+----------+--------------+ SFJ      Full                                                         +---------+---------------+---------+-----------+----------+--------------+ FV Prox  Full                                                        +---------+---------------+---------+-----------+----------+--------------+ FV Mid   Full                                                        +---------+---------------+---------+-----------+----------+--------------+ FV DistalFull                                                        +---------+---------------+---------+-----------+----------+--------------+ PFV      Full                                                        +---------+---------------+---------+-----------+----------+--------------+ POP      Full           Yes      Yes                                 +---------+---------------+---------+-----------+----------+--------------+ PTV      Full                                                        +---------+---------------+---------+-----------+----------+--------------+ PERO     Full                                                        +---------+---------------+---------+-----------+----------+--------------+  Summary: RIGHT: - No evidence of common femoral vein obstruction.   LEFT: - There is no evidence of deep vein thrombosis in the lower extremity.  - No cystic structure found in the popliteal fossa.  *See table(s) above for measurements and observations. Electronically signed by Fonda Rim on 03/01/2024 at 4:39:29 PM.    Final    CT ABDOMEN PELVIS W CONTRAST Result Date: 02/29/2024 EXAM: CT ABDOMEN AND PELVIS WITH CONTRAST 02/29/2024 09:00:00 PM TECHNIQUE: CT of the abdomen and pelvis was performed with the administration of 75 mL of iohexol  (OMNIPAQUE ) 350 MG/ML injection. Multiplanar reformatted images are provided for review. Automated exposure control, iterative reconstruction, and/or weight-based adjustment of the mA/kV was utilized to reduce the radiation dose to as low as reasonably  achievable. COMPARISON: 05/01/2018 CLINICAL HISTORY: Possible subcutaneous abscess. FINDINGS: LIVER: The liver is unremarkable. GALLBLADDER AND BILE DUCTS: Gallbladder is unremarkable. No biliary ductal dilatation. SPLEEN: No acute abnormality. PANCREAS: No acute abnormality. ADRENAL GLANDS: No acute abnormality. KIDNEYS, URETERS AND BLADDER: No stones in the kidneys or ureters. No hydronephrosis. No perinephric or periureteral stranding. Urinary bladder is unremarkable. GI AND BOWEL: Stomach demonstrates no acute abnormality. There is no bowel obstruction. The appendix is within normal limits. PERITONEUM AND RETROPERITONEUM: No ascites. No free air. VASCULATURE: Aorta is normal in caliber. LYMPH NODES: No lymphadenopathy. REPRODUCTIVE ORGANS: No acute abnormality. BONES AND SOFT TISSUES: Bony structures are within normal limits. No focal soft tissue abnormality. IMPRESSION: 1. No acute abnormality in the abdomen or pelvis, and no drainable fluid collection identified. Electronically signed by: Oneil Devonshire MD 02/29/2024 09:29 PM EST RP Workstation: [id number]   CT Angio Chest PE W and/or Wo Contrast Result Date: 02/29/2024 EXAM: CTA CHEST 02/29/2024 09:00:00 PM TECHNIQUE: CTA of the chest was performed without and with the administration of 75 mL of iohexol  (OMNIPAQUE ) 350 MG/ML injection. Multiplanar reformatted images are provided for review. MIP images are provided for review. Automated exposure control, iterative reconstruction, and/or weight based adjustment of the mA/kV was utilized to reduce the radiation dose to as low as reasonably achievable. COMPARISON: Chest x-ray from earlier in the same day. CLINICAL HISTORY: Shortness of breath. FINDINGS: PULMONARY ARTERIES: The pulmonary artery is well visualized with a normal branching pattern. No intraluminal filling defect to suggest pulmonary embolism is noted. Main pulmonary artery is normal in caliber. MEDIASTINUM: No cardiac enlargement is noted. The  pericardium demonstrates no acute abnormality. The thoracic aorta is unremarkable. LYMPH NODES: No mediastinal, hilar or axillary lymphadenopathy. LUNGS AND PLEURA: The lungs are well aerated bilaterally. A small 6 mm nodule is noted in the right middle lobe anteriorly, best seen on image number 98 and 99 of series 5. No focal consolidation or pulmonary edema. No evidence of pleural effusion or pneumothorax. UPPER ABDOMEN: Limited images of the upper abdomen are unremarkable. SOFT TISSUES AND BONES: Bony structures are within normal limits. No acute soft tissue abnormality. IMPRESSION: 1. No evidence of pulmonary embolism. 2. Small 6 mm nodule in the right middle lobe anteriorly.Per Fleischner Society Guidelines recommend a non-contrast chest CT at 6-12 months, then consider an additional non-contrast chest CT at 18-24 months. Electronically signed by: Oneil Devonshire MD 02/29/2024 09:26 PM EST RP Workstation: [id number]   DG Chest 2 View Result Date: 02/29/2024 CLINICAL DATA:  Shortness of breath. EXAM: CHEST - 2 VIEW COMPARISON:  Chest radiograph dated 06/01/2016. FINDINGS: A mild central vascular congestion. No focal consolidation, pleural effusion or pneumothorax. The cardiac silhouette is within normal limits. No acute osseous pathology. IMPRESSION:  Mild central vascular congestion. No focal consolidation. Electronically Signed   By: Vanetta Chou M.D.   On: 02/29/2024 19:58     Scheduled Meds:  bictegravir-emtricitabine -tenofovir  AF  1 tablet Oral Daily   folic acid   1 mg Oral Daily   heparin   5,000 Units Subcutaneous Q8H   lidocaine -EPINEPHrine   10 mL Other Once   multivitamin with minerals  1 tablet Oral Daily   Continuous Infusions:  ampicillin -sulbactam (UNASYN ) IV 3 g (03/02/24 0539)   vancomycin  750 mg (03/02/24 0105)     LOS: 2 days    Time spent:    Sigurd Pac, MD [facility]   03/02/2024, 11:52 AM    "

## 2024-03-02 NOTE — Progress Notes (Signed)
 OT Cancellation Note  Patient Details Name: Larry Lutz MRN: [id number] DOB: 1990-09-15   Cancelled Treatment:    Reason Eval/Treat Not Completed: Patient declined, no reason specified Pt requested OT check back for eval in a couple of hours if able.   Mliss Fish 03/02/2024, 7:36 AM

## 2024-03-02 NOTE — Progress Notes (Signed)
 Lab notified me that this patient's MRSA test is positive. MD Preetha notified via secure chat.

## 2024-03-02 NOTE — Evaluation (Signed)
 Occupational Therapy Evaluation/Discharge Patient Details Name: Larry Lutz MRN: [id number] DOB: Sep 10, 1990 Today's Date: 03/02/2024   History of Present Illness   33 y.o. male admitted 02/29/24 with a few weeks of BLE abscesses, edema, pain (LLE > RLE); workup for cellulitis/folliculitis. Of note, admit 10/2023 with same issue of MRSA folliculitis/cellulitis. Other PMH includes AIDS, HIV, anemia, ulcerative colitis.     Clinical Impressions PTA, pt stays in hotels, typically Independent with ADLs, IADLs and mobility. Pt presents now with LLE pain but able to manage ADLs/bathroom mobility without AD easily today. However, pt does report intermittent pain limiting mobility and requesting RW upon DC to offload painful LE. As pt managing ADLs/mobility in room w/o assist this AM, no further skilled OT services needed at this time. Recommend pt to work with mobility specialists while admitted.     If plan is discharge home, recommend the following:   Other (comment) (PRN)     Functional Status Assessment   Patient has had a recent decline in their functional status and demonstrates the ability to make significant improvements in function in a reasonable and predictable amount of time.     Equipment Recommendations   Other (comment) (pt requesting RW to offload painful LLE)     Recommendations for Other Services         Precautions/Restrictions   Precautions Precautions: Fall Recall of Precautions/Restrictions: Intact Restrictions Weight Bearing Restrictions Per Provider Order: No     Mobility Bed Mobility Overal bed mobility: Modified Independent                  Transfers Overall transfer level: Independent Equipment used: None                      Balance Overall balance assessment: Needs assistance Sitting-balance support: No upper extremity supported, Feet supported Sitting balance-Leahy Scale: Normal     Standing balance support: No upper  extremity supported Standing balance-Leahy Scale: Good                             ADL either performed or assessed with clinical judgement   ADL Overall ADL's : Modified independent                                       General ADL Comments: on entry, pt reports need to go to bathroom urgently. pt able to quickly get up, walk to bathroom without AD and manage all bathroom tasks without issues. Pt reports LE pain but was able to manage mobility today. Does request RW to offload painful LE for now. Once back to bed from bathroom, pt reported he thought he needed to go again, able to make it to bathroom without issues.     Vision Ability to See in Adequate Light: 0 Adequate Patient Visual Report: No change from baseline Vision Assessment?: No apparent visual deficits     Perception         Praxis         Pertinent Vitals/Pain Pain Assessment Pain Assessment: Faces Faces Pain Scale: Hurts a little bit Pain Location: LLE Pain Descriptors / Indicators: Sore, Grimacing Pain Intervention(s): Monitored during session, Premedicated before session     Extremity/Trunk Assessment Upper Extremity Assessment Upper Extremity Assessment: Overall WFL for tasks assessed;Right hand dominant   Lower Extremity Assessment Lower Extremity Assessment: Defer  to PT evaluation   Cervical / Trunk Assessment Cervical / Trunk Assessment: Normal   Communication Communication Communication: No apparent difficulties   Cognition Arousal: Alert Behavior During Therapy: WFL for tasks assessed/performed Cognition: No apparent impairments                               Following commands: Intact       Cueing  General Comments   Cueing Techniques: Verbal cues      Exercises     Shoulder Instructions      Home Living Family/patient expects to be discharged to:: Other (Comment)                                 Additional Comments:  recently released from jail, has been staying in hotels; reports family coming from [place] to visit and hopefully able to get him another hotel      Prior Functioning/Environment Prior Level of Function : Independent/Modified Independent             Mobility Comments: typically independent without DME, walks or takes the bus, currently looking for employment ADLs Comments: independent    OT Problem List: Pain   OT Treatment/Interventions:        OT Goals(Current goals can be found in the care plan section)   Acute Rehab OT Goals Patient Stated Goal: pain control OT Goal Formulation: All assessment and education complete, DC therapy   OT Frequency:       Co-evaluation              AM-PAC OT 6 Clicks Daily Activity     Outcome Measure Help from another person eating meals?: None Help from another person taking care of personal grooming?: None Help from another person toileting, which includes using toliet, bedpan, or urinal?: None Help from another person bathing (including washing, rinsing, drying)?: None Help from another person to put on and taking off regular upper body clothing?: None Help from another person to put on and taking off regular lower body clothing?: None 6 Click Score: 24   End of Session Nurse Communication: Mobility status  Activity Tolerance: Patient tolerated treatment well Patient left: Other (comment) (in bathroom x2; NT and RN aware)  OT Visit Diagnosis: Pain Pain - Right/Left: Left Pain - part of body: Leg                Time: 9042-8985 OT Time Calculation (min): 17 min Charges:  OT General Charges $OT Visit: 1 Visit OT Evaluation $OT Eval Low Complexity: 1 Low  Mliss NOVAK, OTR/L Acute Rehab Services Office: [phone]   Mliss Fish 03/02/2024, 12:05 PM

## 2024-03-03 ENCOUNTER — Inpatient Hospital Stay (HOSPITAL_COMMUNITY)

## 2024-03-03 DIAGNOSIS — L03119 Cellulitis of unspecified part of limb: Secondary | ICD-10-CM | POA: Diagnosis not present

## 2024-03-03 LAB — BASIC METABOLIC PANEL WITH GFR
Anion gap: 9 (ref 5–15)
BUN: 14 mg/dL (ref 6–20)
CO2: 26 mmol/L (ref 22–32)
Calcium: 8.6 mg/dL — ABNORMAL LOW (ref 8.9–10.3)
Chloride: 102 mmol/L (ref 98–111)
Creatinine, Ser: 0.92 mg/dL (ref 0.61–1.24)
GFR, Estimated: >60 mL/min
Glucose, Bld: 96 mg/dL (ref 70–99)
Potassium: 4.1 mmol/L (ref 3.5–5.1)
Sodium: 136 mmol/L (ref 135–145)

## 2024-03-03 LAB — CBC
HCT: 32.5 % — ABNORMAL LOW (ref 39.0–52.0)
Hemoglobin: 10 g/dL — ABNORMAL LOW (ref 13.0–17.0)
MCH: 22 pg — ABNORMAL LOW (ref 26.0–34.0)
MCHC: 30.8 g/dL (ref 30.0–36.0)
MCV: 71.4 fL — ABNORMAL LOW (ref 80.0–100.0)
Platelets: 341 K/uL (ref 150–400)
RBC: 4.55 MIL/uL (ref 4.22–5.81)
RDW: 17.6 % — ABNORMAL HIGH (ref 11.5–15.5)
WBC: 3.9 K/uL — ABNORMAL LOW (ref 4.0–10.5)
nRBC: 0 % (ref 0.0–0.2)

## 2024-03-03 MED ORDER — HYDROMORPHONE HCL 1 MG/ML IJ SOLN
1.0000 mg | Freq: Once | INTRAMUSCULAR | Status: AC
  Administered 2024-03-03: 1 mg via INTRAVENOUS

## 2024-03-03 MED ORDER — HYDROMORPHONE HCL 1 MG/ML IJ SOLN
INTRAMUSCULAR | Status: AC
  Filled 2024-03-03: qty 1

## 2024-03-03 NOTE — Progress Notes (Signed)
 " PROGRESS NOTE    Larry Lutz  FMW:[id number] DOB: 21-Nov-1990 DOA: 02/29/2024 PCP: Patient, No Pcp Per   33/M w HIV on Biktarvy , Ulcerative colitis , hx of MRSA folliculitis/cellulitis, Hypertension presented to ED with complaint of few weeks of pustules on his lower extremities. He notes symptoms have progressed with his left limb being worse affected. He notes he has tender red area over his knee in the area where a pustule recently ruptured. He also notes area mid lateral thigh that is warm erythematous and tender, he was just released from jail a few weeks ago. -In the ED, afebrile, mild leukocytosis, lactate 3.6 and then 5.4, repeat improved,, also had a CT chest and abdomen pelvis in the ER which was unremarkable except for lung nodule - General Surgery consulted, status post I&D  Subjective: - Asking about his knee abscess, overall feels fair  Assessment and Plan:  Left thigh, superficial knee abscess, folliculitis History of MRSA -Continue IV vancomycin  today, discussed with ID will switch to oral doxycycline  at discharge for 2 weeks - Appreciate surgical eval, sp I&D - Will request Ortho eval regarding superficial knee abscess  HIV - CD4 count<100 in August 2025, was noncompliant with HAART then - History of alleged compliance since 8/25 onwards - Discussed with ID yesterday - CD4 count is 202, considerable improvement from last year  Chronic anemia - Likely secondary to above, monitor  Pulmonary nodule - Needs follow-up   DVT prophylaxis: Heparin  subcutaneous Code Status: Full code Family Communication: None present Disposition Plan: Home in 1 to 2 days  Consultants: No surgery   Objective: Vitals:   03/02/24 1405 03/02/24 1919 03/03/24 0400 03/03/24 0738  BP: 108/70 119/76 112/67 (!) 123/90  Pulse: 69 72 72 69  Resp: 16 18 19 16   Temp: 98.4 F (36.9 C) 98.8 F (37.1 C) 98.6 F (37 C) 98.3 F (36.8 C)  TempSrc:   Oral   SpO2: 98% 100%  100%  Weight:       Height:        Intake/Output Summary (Last 24 hours) at 03/03/2024 1019 Last data filed at 03/03/2024 0900 Gross per 24 hour  Intake 734 ml  Output 1150 ml  Net -416 ml   Filed Weights   02/29/24 1658  Weight: 72.6 kg    Examination:  General exam: Appears calm and comfortable, AAO x 3 Respiratory system: Clear to auscultation Cardiovascular system: S1 & S2 heard, RRR.  Abd: nondistended, soft and nontender.Normal bowel sounds heard. Central nervous system: Alert and oriented. No focal neurological deficits. Extremities: no edema Skin: Abscess in thigh and left knee draining purulence Psychiatry:  Mood & affect appropriate.     Data Reviewed:   CBC: Recent Labs  Lab 02/29/24 1911 03/01/24 0007 03/02/24 0413 03/03/24 0704  WBC 10.8* 8.7 7.2 3.9*  NEUTROABS 7.5 5.8  --   --   HGB 10.6* 9.1* 9.2* 10.0*  HCT 35.4* 30.3* 30.2* 32.5*  MCV 73.3* 74.3* 71.6* 71.4*  PLT 367 298 332 341   Basic Metabolic Panel: Recent Labs  Lab 02/29/24 1911 03/01/24 0007 03/02/24 0413 03/03/24 0704  NA 137 133* 132* 136  K 3.9 3.6 3.9 4.1  CL 97* 98 98 102  CO2 29 23 25 26   GLUCOSE 94 100* 89 96  BUN 16 12 9 14   CREATININE 1.26* 1.04 1.14 0.92  CALCIUM 9.5 8.5* 8.3* 8.6*   GFR: Estimated Creatinine Clearance: 103.4 mL/min (by C-G formula based on SCr of 0.92 mg/dL).  Liver Function Tests: Recent Labs  Lab 02/29/24 1911 03/01/24 0007  AST 31 29  ALT 14 10  ALKPHOS 74 70  BILITOT 0.5 0.4  PROT 8.4* 6.8  ALBUMIN 3.9 3.2*   No results for input(s): LIPASE, AMYLASE in the last 168 hours. No results for input(s): AMMONIA in the last 168 hours. Coagulation Profile: No results for input(s): INR, PROTIME in the last 168 hours. Cardiac Enzymes: No results for input(s): CKTOTAL, CKMB, CKMBINDEX, TROPONINI in the last 168 hours. BNP (last 3 results) No results for input(s): PROBNP in the last 8760 hours. HbA1C: Recent Labs    03/01/24 0007  HGBA1C  5.6   CBG: No results for input(s): GLUCAP in the last 168 hours. Lipid Profile: No results for input(s): CHOL, HDL, LDLCALC, TRIG, CHOLHDL, LDLDIRECT in the last 72 hours. Thyroid Function Tests: No results for input(s): TSH, T4TOTAL, FREET4, T3FREE, THYROIDAB in the last 72 hours. Anemia Panel: No results for input(s): VITAMINB12, FOLATE, FERRITIN, TIBC, IRON, RETICCTPCT in the last 72 hours. Urine analysis:    Component Value Date/Time   COLORURINE YELLOW 05/03/2018 1325   APPEARANCEUR CLEAR 05/03/2018 1325   LABSPEC 1.011 05/03/2018 1325   PHURINE 8.0 05/03/2018 1325   GLUCOSEU NEGATIVE 05/03/2018 1325   HGBUR SMALL (A) 05/03/2018 1325   BILIRUBINUR NEGATIVE 05/03/2018 1325   KETONESUR NEGATIVE 05/03/2018 1325   PROTEINUR NEGATIVE 05/03/2018 1325   NITRITE NEGATIVE 05/03/2018 1325   LEUKOCYTESUR NEGATIVE 05/03/2018 1325   Sepsis Labs: @LABRCNTIP (procalcitonin:4,lacticidven:4)  ) Recent Results (from the past 240 hours)  Blood culture (routine x 2)     Status: None (Preliminary result)   Collection Time: 02/29/24  7:11 PM   Specimen: BLOOD  Result Value Ref Range Status   Specimen Description BLOOD SITE NOT SPECIFIED  Final   Special Requests   Final    BOTTLES DRAWN AEROBIC AND ANAEROBIC Blood Culture results may not be optimal due to an inadequate volume of blood received in culture bottles   Culture   Final    NO GROWTH 3 DAYS Performed at [facility], [address] [place], [place] [address]    Report Status PENDING  Incomplete  Blood culture (routine x 2)     Status: None (Preliminary result)   Collection Time: 02/29/24  7:14 PM   Specimen: BLOOD  Result Value Ref Range Status   Specimen Description BLOOD SITE NOT SPECIFIED  Final   Special Requests   Final    BOTTLES DRAWN AEROBIC AND ANAEROBIC Blood Culture results may not be optimal due to an inadequate volume of blood received in culture bottles   Culture    Final    NO GROWTH 3 DAYS Performed at [facility], [address] [place], [place] [address]    Report Status PENDING  Incomplete  MRSA Next Gen by PCR, Nasal     Status: Abnormal   Collection Time: 03/02/24 11:04 AM   Specimen: Nasal Mucosa; Nasal Swab  Result Value Ref Range Status   MRSA by PCR Next Gen DETECTED (A) NOT DETECTED Final    Comment: RESULT CALLED TO, READ BACK BY AND VERIFIED WITH: RN TINA B ON [id number] @1412  BY SM (NOTE) The GeneXpert MRSA Assay (FDA approved for NASAL specimens only), is one component of a comprehensive MRSA colonization surveillance program. It is not intended to diagnose MRSA infection nor to guide or monitor treatment for MRSA infections. Test performance is not FDA approved in patients less than 2 years  old. Performed at [facility], [address] [place], [place] [address]      Radiology Studies: No results found.    Scheduled Meds:  bictegravir-emtricitabine -tenofovir  AF  1 tablet Oral Daily   Chlorhexidine  Gluconate Cloth  6 each Topical Daily   folic acid   1 mg Oral Daily   heparin   5,000 Units Subcutaneous Q8H   lidocaine -EPINEPHrine   10 mL Other Once   multivitamin with minerals  1 tablet Oral Daily   mupirocin  ointment  1 Application Nasal BID   Continuous Infusions:  vancomycin  750 mg (03/03/24 0158)     LOS: 3 days    Time spent:    Sigurd Pac, MD [facility]   03/03/2024, 10:19 AM    "

## 2024-03-03 NOTE — Progress Notes (Signed)
" ° ° °   °  Subjective: Afebrile. No issues with thigh wound.   Objective: Vital signs in last 24 hours: Temp:  [98.3 F (36.8 C)-98.8 F (37.1 C)] 98.3 F (36.8 C) (12/20 0738) Pulse Rate:  [69-72] 69 (12/20 0738) Resp:  [16-19] 16 (12/20 0738) BP: (108-123)/(67-90) 123/90 (12/20 0738) SpO2:  [98 %-100 %] 100 % (12/20 0738) Last BM Date : 02/28/24  Intake/Output from previous day: 12/19 0701 - 12/20 0700 In: 614 [P.O.:614] Out: 950 [Urine:950] Intake/Output this shift: Total I/O In: 120 [P.O.:120] Out: 200 [Urine:200]  PE: General: resting comfortably, NAD Neuro: alert and oriented, no focal deficits Resp: normal work of breathing on room air Extremities: left thigh wound with minimal seropurulent drainage on dressing, packing removed, no surrounding cellulitis   Lab Results:  Recent Labs    03/02/24 0413 03/03/24 0704  WBC 7.2 3.9*  HGB 9.2* 10.0*  HCT 30.2* 32.5*  PLT 332 341   BMET Recent Labs    03/02/24 0413 03/03/24 0704  NA 132* 136  K 3.9 4.1  CL 98 102  CO2 25 26  GLUCOSE 89 96  BUN 9 14  CREATININE 1.14 0.92  CALCIUM 8.3* 8.6*   PT/INR No results for input(s): LABPROT, INR in the last 72 hours. CMP     Component Value Date/Time   NA 136 03/03/2024 0704   K 4.1 03/03/2024 0704   CL 102 03/03/2024 0704   CO2 26 03/03/2024 0704   GLUCOSE 96 03/03/2024 0704   BUN 14 03/03/2024 0704   CREATININE 0.92 03/03/2024 0704   CREATININE 1.04 01/11/2024 1000   CALCIUM 8.6 (L) 03/03/2024 0704   PROT 6.8 03/01/2024 0007   ALBUMIN 3.2 (L) 03/01/2024 0007   AST 29 03/01/2024 0007   ALT 10 03/01/2024 0007   ALKPHOS 70 03/01/2024 0007   BILITOT 0.4 03/01/2024 0007   GFRNONAA >60 03/03/2024 0704   GFRNONAA 86 08/15/2018 1437   GFRAA 99 08/15/2018 1437   Lipase     Component Value Date/Time   LIPASE 11 05/31/2016 1858         Assessment/Plan 33 yo male with a history of HIV, UC and MRSA cellulitis, presenting with abscesses on LLE.  S/p bedside I&D of left thigh abscess on 12/18.  - Wound packing removed this morning, no need for further packing. Cover with dry gauze to be changed daily, patient may shower over wound. - Ortho at bedside evaluating knee abscess - On vancomycin , planning for 2 weeks doxycycline  at discharge  - General surgery will sign off, please call with further questions or concerns.    LOS: 3 days    Leonor Dawn, MD [facility] Surgery General, Hepatobiliary and Pancreatic Surgery 03/03/2024 12:36 PM  "

## 2024-03-03 NOTE — Consult Note (Signed)
 "    ORTHOPAEDIC CONSULTATION  REQUESTING PHYSICIAN: Fairy Frames, MD  Chief Complaint: left knee pain  HPI: Larry Lutz is a 33 y.o. male with medical history significant of  HIV on Biktarvy , Ulcerative colitis not on controller medication, hx of MRSA  folliculitis/cellulitis, Hypertension not currently on medications, who presents to ED on 02/29/24 with complaint of few weeks of pustules on his lower extremities. He was admitted for antibiotics. General surgery was consulted. They performed a bedside I&D of his left thigh on 03/01/24. They recommended orthopedic consultation for the left knee abscess as it is over the joint. Orthopedics consulted on 03/03/24.  Patient seen in 5N13. Continues to have pain in the left knee. Thigh pain has improved some after I&D. He notes initially he had pain with moving his left knee, but now he is able to do so without issue. He reports drainage from the abscess on the left knee. No prior injury to the left knee. No prior abscess or joint infection of the left knee.   Past Medical History:  Diagnosis Date   Anxiety    Asthma    Colitis    Colon polyp    Crohn disease (HCC)    Depressed    GI bleed    HIV (human immunodeficiency virus infection) (HCC)    Hypertension    IBS (irritable bowel syndrome)    UC (ulcerative colitis) (HCC)    Past Surgical History:  Procedure Laterality Date   BRAIN SURGERY     COLONOSCOPY     in [place]  around 2017 or 2018    Social History   Socioeconomic History   Marital status: Single    Spouse name: Not on file   Number of children: 0   Years of education: Not on file   Highest education level: Not on file  Occupational History   Not on file  Tobacco Use   Smoking status: Former    Types: Cigarettes   Smokeless tobacco: Never  Vaping Use   Vaping status: Former  Substance and Sexual Activity   Alcohol use: Not Currently   Drug use: Not Currently    Types: Marijuana, Methamphetamines, Crack  cocaine    Comment: last use 2024   Sexual activity: Yes    Partners: Male  Other Topics Concern   Not on file  Social History Narrative   Not on file   Social Drivers of Health   Tobacco Use: Medium Risk (03/01/2024)   Patient History    Smoking Tobacco Use: Former    Smokeless Tobacco Use: Never    Passive Exposure: Not on Actuary Strain: Not on file  Food Insecurity: Food Insecurity Present (03/01/2024)   Epic    Worried About Programme Researcher, Broadcasting/film/video in the Last Year: Often true    Ran Out of Food in the Last Year: Often true  Transportation Needs: Unmet Transportation Needs (03/01/2024)   Epic    Lack of Transportation (Medical): Yes    Lack of Transportation (Non-Medical): Yes  Physical Activity: Not on file  Stress: Not on file  Social Connections: Moderately Isolated (03/01/2024)   Social Connection and Isolation Panel    Frequency of Communication with Friends and Family: More than three times a week    Frequency of Social Gatherings with Friends and Family: Once a week    Attends Religious Services: 1 to 4 times per year    Active Member of Clubs or Organizations: No    Attends  Club or Organization Meetings: Never    Marital Status: Never married  Depression (PHQ2-9): Medium Risk (07/12/2022)   Depression (PHQ2-9)    PHQ-2 Score: 5  Alcohol Screen: Not on file  Housing: High Risk (03/01/2024)   Epic    Unable to Pay for Housing in the Last Year: Yes    Number of Times Moved in the Last Year: 5    Homeless in the Last Year: Yes  Utilities: At Risk (03/01/2024)   Epic    Threatened with loss of utilities: Yes  Health Literacy: Not on file   Family History  Problem Relation Age of Onset   Ulcerative colitis Mother    Irritable bowel syndrome Mother    Prostate cancer Paternal Grandfather    Diabetes Paternal Aunt    Colon cancer Neg Hx    Esophageal cancer Neg Hx    Rectal cancer Neg Hx    Stomach cancer Neg Hx    Allergies[1] Prior to  Admission medications  Medication Sig Start Date End Date Taking? Authorizing Provider  bictegravir-emtricitabine -tenofovir  AF (BIKTARVY ) 50-200-25 MG TABS tablet Take 1 tablet by mouth daily. Try to take at the same time each day with or without food. 10/31/23  Yes [name], NP  hydrOXYzine  (ATARAX ) 10 MG tablet Take 1 tablet (10 mg total) by mouth 3 (three) times daily as needed for itching or anxiety. 10/18/23  Yes   sulfamethoxazole -trimethoprim  (BACTRIM  DS) 800-160 MG tablet Take 1 tablet by mouth 3 (three) times a week. Patient not taking: Reported on 02/29/2024 10/31/23   [name], NP   No results found. Family History Reviewed and non-contributory, no pertinent history of problems with bleeding or anesthesia      Review of Systems 14 system ROS conducted and negative except for that noted in HPI   OBJECTIVE  Vitals:Patient Vitals for the past 8 hrs:  BP Temp Pulse Resp SpO2  03/03/24 0738 (!) 123/90 98.3 F (36.8 C) 69 16 100 %   General: Alert, no acute distress Cardiovascular: Warm extremities noted Respiratory: No cyanosis, no use of accessory musculature GI: No organomegaly, abdomen is soft and non-tender Skin: No lesions in the area of chief complaint other than those listed below in MSK exam.  Neurologic: Sensation intact distally save for the below mentioned MSK exam Psychiatric: Patient is competent for consent with normal mood and affect Lymphatic: No swelling obvious and reported other than the area involved in the exam below  Extremities  LLE: superficial abscess present on the left knee just above patella/patellar tendon with active drainage present, desquamation around the abscess, patient has full ROM of the left knee, pain with full flexion, abscess was able to be manually decompressed without making an incision, significant purulent drainage was expressed from the abscess, the skin which was sloughing off was removed, there was induration just  lateral and proximal to the abscess which make communicate but difficult to tell, this area continued to be painful when touched, full ROM of the left ankle, TTP medial aspect of the left foot, no swelling distally, no signs of cellulitis distally  Test Results Imaging N/A  Labs cbc Recent Labs    03/02/24 0413 03/03/24 0704  WBC 7.2 3.9*  HGB 9.2* 10.0*  HCT 30.2* 32.5*  PLT 332 341    Labs inflam Recent Labs    03/01/24 0007  CRP 9.3*    Labs coag No results for input(s): INR, PTT in the last 72 hours.  Invalid input(s):  PT  Recent Labs    03/02/24 0413 03/03/24 0704  NA 132* 136  K 3.9 4.1  CL 98 102  CO2 25 26  GLUCOSE 89 96  BUN 9 14  CREATININE 1.14 0.92  CALCIUM 8.3* 8.6*     ASSESSMENT AND PLAN: 33 y.o. male with the following: left knee superficial abscess  - Abscess was able to be manually decompressed and irrigated at bedside.  - Bulky dressing was applied.  - Will obtain MRI to ensure no fluid collection does not tract more proximally.  - Reinforce dressing as needed.  - Discussed the area will continue to drain as expected.  - WBAT.  - Will also order x-rays of the left foot due to reported pain.   Aleck Stalling, PA-C 02/02/24     [1] No Known Allergies  "

## 2024-03-04 DIAGNOSIS — L03119 Cellulitis of unspecified part of limb: Secondary | ICD-10-CM | POA: Diagnosis not present

## 2024-03-04 LAB — BASIC METABOLIC PANEL WITH GFR
Anion gap: 10 (ref 5–15)
BUN: 15 mg/dL (ref 6–20)
CO2: 23 mmol/L (ref 22–32)
Calcium: 8.8 mg/dL — ABNORMAL LOW (ref 8.9–10.3)
Chloride: 104 mmol/L (ref 98–111)
Creatinine, Ser: 0.94 mg/dL (ref 0.61–1.24)
GFR, Estimated: >60 mL/min
Glucose, Bld: 91 mg/dL (ref 70–99)
Potassium: 4.4 mmol/L (ref 3.5–5.1)
Sodium: 137 mmol/L (ref 135–145)

## 2024-03-04 LAB — VANCOMYCIN, TROUGH: Vancomycin Tr: 8 ug/mL — ABNORMAL LOW (ref 15–20)

## 2024-03-04 LAB — VANCOMYCIN, PEAK: Vancomycin Pk: 13 ug/mL — ABNORMAL LOW (ref 30–40)

## 2024-03-04 MED ORDER — VANCOMYCIN HCL 1250 MG/250ML IV SOLN
1250.0000 mg | Freq: Two times a day (BID) | INTRAVENOUS | Status: DC
  Administered 2024-03-04 – 2024-03-05 (×2): 1250 mg via INTRAVENOUS
  Filled 2024-03-04 (×3): qty 250

## 2024-03-04 NOTE — Progress Notes (Signed)
 " PROGRESS NOTE    Larry Lutz  FMW:[id number] DOB: 11-Apr-1990 DOA: 02/29/2024 PCP: Patient, No Pcp Per   33/M w HIV on Biktarvy , Ulcerative colitis , hx of MRSA folliculitis/cellulitis, Hypertension presented to ED with complaint of few weeks of pustules on his lower extremities. He notes symptoms have progressed with his left limb being worse affected. He notes he has tender red area over his knee in the area where a pustule recently ruptured. He also notes area mid lateral thigh that is warm erythematous and tender, he was just released from jail a few weeks ago. -In the ED, afebrile, mild leukocytosis, lactate 3.6 and then 5.4, repeat improved,, also had a CT chest and abdomen pelvis in the ER which was unremarkable except for lung nodule - General Surgery consulted, status post I&D 12/21, Ortho consulted, bedside debridement  Subjective: - Feels fair, reports that he is homeless, unsure he will be able to do dressing changes  Assessment and Plan:  Left thigh, superficial knee abscess, folliculitis History of MRSA -Continue IV vancomycin  today, discussed with ID will switch to oral doxycycline  at discharge for 2 weeks - Appreciate surgical eval, sp I&D - Ortho input regarding superficial knee abscess, status post bedside debridement - Continue wound care - [facility] consult, allegedly he is homeless  HIV - CD4 count<100 in August 2025, was noncompliant with HAART then - History of alleged compliance since 8/25 onwards - Discussed with ID yesterday - CD4 count is 202, considerable improvement from last year  Chronic anemia - Likely secondary to above, monitor  Pulmonary nodule - Needs follow-up   DVT prophylaxis: Heparin  subcutaneous Code Status: Full code Family Communication: None present Disposition Plan: Home tomorrow  Consultants: No surgery   Objective: Vitals:   03/03/24 1506 03/03/24 2148 03/04/24 0443 03/04/24 0752  BP: 130/87 126/84 124/80 (!) 138/90  Pulse: 69 71  62 61  Resp: 16 15 16 16   Temp: 98.4 F (36.9 C) 98.3 F (36.8 C) 98.2 F (36.8 C) 97.6 F (36.4 C)  TempSrc:  Oral Oral   SpO2: 99% 100% 100% 100%  Weight:      Height:        Intake/Output Summary (Last 24 hours) at 03/04/2024 1212 Last data filed at 03/04/2024 1024 Gross per 24 hour  Intake 870 ml  Output --  Net 870 ml   Filed Weights   02/29/24 1658  Weight: 72.6 kg    Examination:  General exam: Appears calm and comfortable, AAO x 3 Respiratory system: Clear to auscultation Cardiovascular system: S1 & S2 heard, RRR.  Abd: nondistended, soft and nontender.Normal bowel sounds heard. Central nervous system: Alert and oriented. No focal neurological deficits. Extremities: no edema Skin: Abscess in thigh and left knee draining purulence Psychiatry:  Mood & affect appropriate.     Data Reviewed:   CBC: Recent Labs  Lab 02/29/24 1911 03/01/24 0007 03/02/24 0413 03/03/24 0704  WBC 10.8* 8.7 7.2 3.9*  NEUTROABS 7.5 5.8  --   --   HGB 10.6* 9.1* 9.2* 10.0*  HCT 35.4* 30.3* 30.2* 32.5*  MCV 73.3* 74.3* 71.6* 71.4*  PLT 367 298 332 341   Basic Metabolic Panel: Recent Labs  Lab 02/29/24 1911 03/01/24 0007 03/02/24 0413 03/03/24 0704 03/04/24 0819  NA 137 133* 132* 136 137  K 3.9 3.6 3.9 4.1 4.4  CL 97* 98 98 102 104  CO2 29 23 25 26 23   GLUCOSE 94 100* 89 96 91  BUN 16 12 9 14  15  CREATININE 1.26* 1.04 1.14 0.92 0.94  CALCIUM 9.5 8.5* 8.3* 8.6* 8.8*   GFR: Estimated Creatinine Clearance: 101.2 mL/min (by C-G formula based on SCr of 0.94 mg/dL). Liver Function Tests: Recent Labs  Lab 02/29/24 1911 03/01/24 0007  AST 31 29  ALT 14 10  ALKPHOS 74 70  BILITOT 0.5 0.4  PROT 8.4* 6.8  ALBUMIN 3.9 3.2*   No results for input(s): LIPASE, AMYLASE in the last 168 hours. No results for input(s): AMMONIA in the last 168 hours. Coagulation Profile: No results for input(s): INR, PROTIME in the last 168 hours. Cardiac Enzymes: No results  for input(s): CKTOTAL, CKMB, CKMBINDEX, TROPONINI in the last 168 hours. BNP (last 3 results) No results for input(s): PROBNP in the last 8760 hours. HbA1C: No results for input(s): HGBA1C in the last 72 hours.  CBG: No results for input(s): GLUCAP in the last 168 hours. Lipid Profile: No results for input(s): CHOL, HDL, LDLCALC, TRIG, CHOLHDL, LDLDIRECT in the last 72 hours. Thyroid Function Tests: No results for input(s): TSH, T4TOTAL, FREET4, T3FREE, THYROIDAB in the last 72 hours. Anemia Panel: No results for input(s): VITAMINB12, FOLATE, FERRITIN, TIBC, IRON, RETICCTPCT in the last 72 hours. Urine analysis:    Component Value Date/Time   COLORURINE YELLOW 05/03/2018 1325   APPEARANCEUR CLEAR 05/03/2018 1325   LABSPEC 1.011 05/03/2018 1325   PHURINE 8.0 05/03/2018 1325   GLUCOSEU NEGATIVE 05/03/2018 1325   HGBUR SMALL (A) 05/03/2018 1325   BILIRUBINUR NEGATIVE 05/03/2018 1325   KETONESUR NEGATIVE 05/03/2018 1325   PROTEINUR NEGATIVE 05/03/2018 1325   NITRITE NEGATIVE 05/03/2018 1325   LEUKOCYTESUR NEGATIVE 05/03/2018 1325   Sepsis Labs: @LABRCNTIP (procalcitonin:4,lacticidven:4)  ) Recent Results (from the past 240 hours)  Blood culture (routine x 2)     Status: None (Preliminary result)   Collection Time: 02/29/24  7:11 PM   Specimen: BLOOD  Result Value Ref Range Status   Specimen Description BLOOD SITE NOT SPECIFIED  Final   Special Requests   Final    BOTTLES DRAWN AEROBIC AND ANAEROBIC Blood Culture results may not be optimal due to an inadequate volume of blood received in culture bottles   Culture   Final    NO GROWTH 4 DAYS Performed at [facility], [address] [place], [place] [address]    Report Status PENDING  Incomplete  Blood culture (routine x 2)     Status: None (Preliminary result)   Collection Time: 02/29/24  7:14 PM   Specimen: BLOOD  Result Value Ref Range Status   Specimen Description  BLOOD SITE NOT SPECIFIED  Final   Special Requests   Final    BOTTLES DRAWN AEROBIC AND ANAEROBIC Blood Culture results may not be optimal due to an inadequate volume of blood received in culture bottles   Culture   Final    NO GROWTH 4 DAYS Performed at [facility], [address] [place], [place] [address]    Report Status PENDING  Incomplete  MRSA Next Gen by PCR, Nasal     Status: Abnormal   Collection Time: 03/02/24 11:04 AM   Specimen: Nasal Mucosa; Nasal Swab  Result Value Ref Range Status   MRSA by PCR Next Gen DETECTED (A) NOT DETECTED Final    Comment: RESULT CALLED TO, READ BACK BY AND VERIFIED WITH: RN TINA B ON [id number] @1412  BY SM (NOTE) The GeneXpert MRSA Assay (FDA approved for NASAL specimens only), is one component of a comprehensive MRSA colonization surveillance program.  It is not intended to diagnose MRSA infection nor to guide or monitor treatment for MRSA infections. Test performance is not FDA approved in patients less than 21 years old. Performed at [facility] Lab, [address], [place], [place] [address]      Radiology Studies: DG Foot Complete Left Result Date: 03/03/2024 CLINICAL DATA:  Left foot pain. EXAM: LEFT FOOT - COMPLETE 3+ VIEW COMPARISON:  None Available. FINDINGS: There is no evidence of fracture or dislocation. There is no evidence of arthropathy or other focal bone abnormality. Soft tissues are unremarkable. IMPRESSION: Negative. Electronically Signed   By: Vanetta Chou M.D.   On: 03/03/2024 15:31      Scheduled Meds:  bictegravir-emtricitabine -tenofovir  AF  1 tablet Oral Daily   Chlorhexidine  Gluconate Cloth  6 each Topical Daily   folic acid   1 mg Oral Daily   heparin   5,000 Units Subcutaneous Q8H   lidocaine -EPINEPHrine   10 mL Other Once   multivitamin with minerals  1 tablet Oral Daily   mupirocin  ointment  1 Application Nasal BID   Continuous Infusions:  vancomycin  Stopped (03/04/24 0321)     LOS: 4 days     Time spent:    Sigurd Pac, MD [facility]   03/04/2024, 12:12 PM    "

## 2024-03-04 NOTE — Progress Notes (Signed)
 Patient educated on the importance of allowing phlebotomy to draw labs when scheduled.

## 2024-03-04 NOTE — Progress Notes (Signed)
 Pharmacy Antibiotic Note  Larry Lutz is a 33 y.o. male admitted on 02/29/2024 with skin lesions on lower extremities/groin area. Pharmacy has been consulted for vancomycin  dosing for cellulitis.  Vancomycin  Peak = 13 (collected at 0819 03/04/2024) - collected late  Vancomycin  Trough = 8 (collected at 1223 03/04/2024)  PK parameters Ke 0.1194, Vd 39.5, clearance 78.6  Current dose of vancomycin  IV 750 mg Q12H is subtherapeutic.  Plan: - Increase to vancomycin  IV 1250 mg Q12H (calculated AUC 529.2, Cmax 38.1, Cmin 10.9) - Monitor renal function, length of therapy, and opportunity to switch to oral (per chart review, plan is to switch to oral doxycycline  at discharge)   Height: 5' 4 (162.6 cm) Weight: 72.6 kg (160 lb) IBW/kg (Calculated) : 59.2  Temp (24hrs), Avg:98.1 F (36.7 C), Min:97.6 F (36.4 C), Max:98.4 F (36.9 C)  Recent Labs  Lab 02/29/24 1911 02/29/24 1931 02/29/24 2141 02/29/24 2319 03/01/24 0007 03/02/24 0413 03/03/24 0704 03/04/24 0819 03/04/24 1223  WBC 10.8*  --   --   --  8.7 7.2 3.9*  --   --   CREATININE 1.26*  --   --   --  1.04 1.14 0.92 0.94  --   LATICACIDVEN  --  3.6* 5.4* 0.8  --   --   --   --   --   VANCOTROUGH  --   --   --   --   --   --   --   --  8*  VANCOPEAK  --   --   --   --   --   --   --  13*  --     Estimated Creatinine Clearance: 101.2 mL/min (by C-G formula based on SCr of 0.94 mg/dL).    Allergies[1]  Antimicrobials this admission: Cefepime  12/17 >> 12/18 Unasyn  12/18 >> 12/19  Vancomycin  12/17 >>   Microbiology results: 12/17 BCx: NGTD 12/19 MRSA PCR detected   Thank you for allowing pharmacy to be a part of this patients care.  Feliciano Close, PharmD PGY2 Infectious Diseases Pharmacy Resident     [1] No Known Allergies

## 2024-03-04 NOTE — Progress Notes (Signed)
" ° °  ORTHOPAEDIC PROGRESS NOTE  Superficial left knee abscess  SUBJECTIVE: Reports mild pain in the left knee. This has improved since yesterday.   OBJECTIVE: PE: General: left knee wound with mild seropurulent drainage on the dressing, dressing removed, mild tenderness around the knee, swelling has improved, full ROM  Vitals:   03/03/24 2148 03/04/24 0443  BP: 126/84 124/80  Pulse: 71 62  Resp: 15 16  Temp: 98.3 F (36.8 C) 98.2 F (36.8 C)  SpO2: 100% 100%    Opiates Today (MME): Today's  total administered Morphine  Milligram Equivalents: 0 Opiates Yesterday (MME): Yesterday's total administered Morphine  Milligram Equivalents: 27.5 Opiates Used in the last two days:  Inpatient Morphine  Milligram Equivalents Per Day 12/17 - 12/21   Values displayed are in units of MME/Day    Order Start / End Date 12/17 12/18 12/19  Yesterday Today    oxyCODONE  (Oxy IR/ROXICODONE ) immediate release tablet 5 mg 12/18 - No end date -- 7.5 of 22.5 7.5 of 30 7.5 of 30 0 of 30    HYDROmorphone  (DILAUDID ) injection 1 mg 12/19 - 12/19 -- -- 20 of 20 -- --    HYDROmorphone  (DILAUDID ) injection 1 mg 12/20 - 12/20 -- -- -- 20 of 20 --    HYDROmorphone  (DILAUDID ) 1 MG/ML injection 12/20 - 12/20 -- -- -- 0 of Unknown --    Daily Totals  -- 7.5 of 22.5 27.5 of 50 27.5 of Unknown (at least 50) 0 of 30    Calculation Errors     Order Type Date Details   HYDROmorphone  (DILAUDID ) 1 MG/ML injection Ordered Dose -- Frequency type could not be determined           MRI of the left knee demonstrates small fluid collection Xray of the left foot negative   ASSESSMENT: Larry Lutz is a 33 y.o. male with left knee abscess  PLAN: Continue daily dressing changes and antibiotics No need for further intervention at this time  Weightbearing: WBAT LLE Insicional and dressing care: Daily dressing changes with Kerlix and ace wrap Orthopedic device(s): None Showering: hold for now VTE prophylaxis: per  primary Pain control: per primary Follow - up plan: outpatient as needed  Contact information:  Weekdays 8-5 Dr. Bonner Hair, Aleck Stalling PA-C, After hours and holidays please check [email or web address] for group call information for [facility]   Aleck Stalling, PA-C 03/04/24  "

## 2024-03-04 NOTE — Plan of Care (Signed)

## 2024-03-05 DIAGNOSIS — L03119 Cellulitis of unspecified part of limb: Secondary | ICD-10-CM | POA: Diagnosis not present

## 2024-03-05 LAB — CULTURE, BLOOD (ROUTINE X 2)
Culture: NO GROWTH
Culture: NO GROWTH

## 2024-03-05 MED ORDER — DOXYCYCLINE HYCLATE 100 MG PO TABS
100.0000 mg | ORAL_TABLET | Freq: Two times a day (BID) | ORAL | Status: DC
  Administered 2024-03-05 – 2024-03-06 (×3): 100 mg via ORAL
  Filled 2024-03-05 (×3): qty 1

## 2024-03-05 NOTE — Progress Notes (Signed)
 " PROGRESS NOTE    Larry Lutz  FMW:[id number] DOB: 1991-02-24 DOA: 02/29/2024 PCP: Patient, No Pcp Per   33/M w HIV on Biktarvy , Ulcerative colitis , hx of MRSA folliculitis/cellulitis, Hypertension presented to ED with complaint of few weeks of pustules on his lower extremities. He notes symptoms have progressed with his left limb being worse affected. He notes he has tender red area over his knee in the area where a pustule recently ruptured. He also notes area mid lateral thigh that is warm erythematous and tender, he was just released from jail a few weeks ago. -In the ED, afebrile, mild leukocytosis, lactate 3.6 and then 5.4, repeat improved,, also had a CT chest and abdomen pelvis in the ER which was unremarkable except for lung nodule - General Surgery consulted, status post I&D 12/21, Ortho consulted, bedside debridement  Subjective: - Feels fair, reports that he is homeless, unsure he will be able to do dressing changes  Assessment and Plan:  Left thigh, superficial knee abscess, folliculitis History of MRSA - Did with IV vancomycin  initially, discussed with ID will switch to oral doxycycline  at discharge for 2 weeks - Appreciate surgical eval, sp I&D - Ortho input regarding superficial knee abscess, status post bedside debridement, MRI reviewed by Ortho, recommended wound care - TOC consult, unfortunately he is homeless, will need supplies and other assistance  HIV - CD4 count<100 in August 2025, was noncompliant with HAART then - History of alleged compliance since 8/25 onwards - CD4 count is 202, considerable improvement from last year  Chronic anemia - Likely secondary to above, monitor  Pulmonary nodule - Needs follow-up   DVT prophylaxis: Heparin  subcutaneous Code Status: Full code Family Communication: None present Disposition Plan: Home tomorrow  Consultants: No surgery   Objective: Vitals:   03/04/24 0752 03/04/24 1541 03/05/24 0500 03/05/24 0758  BP: (!)  138/90 122/79 108/62 120/82  Pulse: 61 77 60 (!) 58  Resp: 16 14 18 16   Temp: 97.6 F (36.4 C) 98.6 F (37 C) 98.4 F (36.9 C) 97.8 F (36.6 C)  TempSrc:   Oral Oral  SpO2: 100% 100% 98% 100%  Weight:      Height:        Intake/Output Summary (Last 24 hours) at 03/05/2024 1211 Last data filed at 03/05/2024 0400 Gross per 24 hour  Intake 430.08 ml  Output 900 ml  Net -469.92 ml   Filed Weights   02/29/24 1658  Weight: 72.6 kg    Examination:  General exam: Appears calm and comfortable, AAO x 3, no distress Respiratory system: Clear to auscultation Cardiovascular system: S1 & S2 heard, RRR.  Abd: nondistended, soft and nontender.Normal bowel sounds heard. Central nervous system: Alert and oriented. No focal neurological deficits. Extremities: no edema Skin: Abscess in left thigh and left knee significantly improved Psychiatry:  Mood & affect appropriate.     Data Reviewed:   CBC: Recent Labs  Lab 02/29/24 1911 03/01/24 0007 03/02/24 0413 03/03/24 0704  WBC 10.8* 8.7 7.2 3.9*  NEUTROABS 7.5 5.8  --   --   HGB 10.6* 9.1* 9.2* 10.0*  HCT 35.4* 30.3* 30.2* 32.5*  MCV 73.3* 74.3* 71.6* 71.4*  PLT 367 298 332 341   Basic Metabolic Panel: Recent Labs  Lab 02/29/24 1911 03/01/24 0007 03/02/24 0413 03/03/24 0704 03/04/24 0819  NA 137 133* 132* 136 137  K 3.9 3.6 3.9 4.1 4.4  CL 97* 98 98 102 104  CO2 29 23 25 26 23   GLUCOSE 94  100* 89 96 91  BUN 16 12 9 14 15   CREATININE 1.26* 1.04 1.14 0.92 0.94  CALCIUM 9.5 8.5* 8.3* 8.6* 8.8*   GFR: Estimated Creatinine Clearance: 101.2 mL/min (by C-G formula based on SCr of 0.94 mg/dL). Liver Function Tests: Recent Labs  Lab 02/29/24 1911 03/01/24 0007  AST 31 29  ALT 14 10  ALKPHOS 74 70  BILITOT 0.5 0.4  PROT 8.4* 6.8  ALBUMIN 3.9 3.2*   No results for input(s): LIPASE, AMYLASE in the last 168 hours. No results for input(s): AMMONIA in the last 168 hours. Coagulation Profile: No results for  input(s): INR, PROTIME in the last 168 hours. Cardiac Enzymes: No results for input(s): CKTOTAL, CKMB, CKMBINDEX, TROPONINI in the last 168 hours. BNP (last 3 results) No results for input(s): PROBNP in the last 8760 hours. HbA1C: No results for input(s): HGBA1C in the last 72 hours.  CBG: No results for input(s): GLUCAP in the last 168 hours. Lipid Profile: No results for input(s): CHOL, HDL, LDLCALC, TRIG, CHOLHDL, LDLDIRECT in the last 72 hours. Thyroid Function Tests: No results for input(s): TSH, T4TOTAL, FREET4, T3FREE, THYROIDAB in the last 72 hours. Anemia Panel: No results for input(s): VITAMINB12, FOLATE, FERRITIN, TIBC, IRON, RETICCTPCT in the last 72 hours. Urine analysis:    Component Value Date/Time   COLORURINE YELLOW 05/03/2018 1325   APPEARANCEUR CLEAR 05/03/2018 1325   LABSPEC 1.011 05/03/2018 1325   PHURINE 8.0 05/03/2018 1325   GLUCOSEU NEGATIVE 05/03/2018 1325   HGBUR SMALL (A) 05/03/2018 1325   BILIRUBINUR NEGATIVE 05/03/2018 1325   KETONESUR NEGATIVE 05/03/2018 1325   PROTEINUR NEGATIVE 05/03/2018 1325   NITRITE NEGATIVE 05/03/2018 1325   LEUKOCYTESUR NEGATIVE 05/03/2018 1325   Sepsis Labs: @LABRCNTIP (procalcitonin:4,lacticidven:4)  ) Recent Results (from the past 240 hours)  Blood culture (routine x 2)     Status: None   Collection Time: 02/29/24  7:11 PM   Specimen: BLOOD  Result Value Ref Range Status   Specimen Description BLOOD SITE NOT SPECIFIED  Final   Special Requests   Final    BOTTLES DRAWN AEROBIC AND ANAEROBIC Blood Culture results may not be optimal due to an inadequate volume of blood received in culture bottles   Culture   Final    NO GROWTH 5 DAYS Performed at [facility], [address] [place], [place] [address]    Report Status 03/05/2024 FINAL  Final  Blood culture (routine x 2)     Status: None   Collection Time: 02/29/24  7:14 PM   Specimen: BLOOD  Result Value  Ref Range Status   Specimen Description BLOOD SITE NOT SPECIFIED  Final   Special Requests   Final    BOTTLES DRAWN AEROBIC AND ANAEROBIC Blood Culture results may not be optimal due to an inadequate volume of blood received in culture bottles   Culture   Final    NO GROWTH 5 DAYS Performed at [facility], [address] [place], [place] [address]    Report Status 03/05/2024 FINAL  Final  MRSA Next Gen by PCR, Nasal     Status: Abnormal   Collection Time: 03/02/24 11:04 AM   Specimen: Nasal Mucosa; Nasal Swab  Result Value Ref Range Status   MRSA by PCR Next Gen DETECTED (A) NOT DETECTED Final    Comment: RESULT CALLED TO, READ BACK BY AND VERIFIED WITH: RN TINA B ON [id number] @1412  BY SM (NOTE) The GeneXpert MRSA Assay (FDA approved for NASAL specimens only),  is one component of a comprehensive MRSA colonization surveillance program. It is not intended to diagnose MRSA infection nor to guide or monitor treatment for MRSA infections. Test performance is not FDA approved in patients less than 37 years old. Performed at [facility], [address], [place], [place] [address]      Radiology Studies: DG Foot Complete Left Result Date: 03/03/2024 CLINICAL DATA:  Left foot pain. EXAM: LEFT FOOT - COMPLETE 3+ VIEW COMPARISON:  None Available. FINDINGS: There is no evidence of fracture or dislocation. There is no evidence of arthropathy or other focal bone abnormality. Soft tissues are unremarkable. IMPRESSION: Negative. Electronically Signed   By: Vanetta Chou M.D.   On: 03/03/2024 15:31      Scheduled Meds:  bictegravir-emtricitabine -tenofovir  AF  1 tablet Oral Daily   Chlorhexidine  Gluconate Cloth  6 each Topical Daily   folic acid   1 mg Oral Daily   heparin   5,000 Units Subcutaneous Q8H   lidocaine -EPINEPHrine   10 mL Other Once   multivitamin with minerals  1 tablet Oral Daily   mupirocin  ointment  1 Application Nasal BID   Continuous Infusions:  vancomycin   1,250 mg (03/05/24 0311)     LOS: 5 days    Time spent:    Sigurd Pac, MD [facility]   03/05/2024, 12:11 PM    "

## 2024-03-05 NOTE — Plan of Care (Signed)
" °  Problem: Education: Goal: Knowledge of General Education information will improve Description: Including pain rating scale, medication(s)/side effects and non-pharmacologic comfort measures Outcome: Progressing   Problem: Clinical Measurements: Goal: Will remain free from infection Outcome: Progressing   Problem: Clinical Measurements: Goal: Will remain free from infection Outcome: Progressing   Problem: Clinical Measurements: Goal: Respiratory complications will improve Outcome: Progressing   "

## 2024-03-06 ENCOUNTER — Other Ambulatory Visit (HOSPITAL_COMMUNITY): Payer: Self-pay

## 2024-03-06 DIAGNOSIS — L03119 Cellulitis of unspecified part of limb: Secondary | ICD-10-CM | POA: Diagnosis not present

## 2024-03-06 MED ORDER — ADULT MULTIVITAMIN W/MINERALS CH
1.0000 | ORAL_TABLET | Freq: Every day | ORAL | 0 refills | Status: AC
  Filled 2024-03-06: qty 30, 30d supply, fill #0

## 2024-03-06 MED ORDER — DOXYCYCLINE HYCLATE 100 MG PO TABS
100.0000 mg | ORAL_TABLET | Freq: Two times a day (BID) | ORAL | 0 refills | Status: AC
  Filled 2024-03-06: qty 20, 10d supply, fill #0

## 2024-03-06 NOTE — Progress Notes (Signed)
 PT Cancellation Note  Patient Details Name: Larry Lutz MRN: [id number] DOB: 01/05/1991   Cancelled Treatment:    Reason Eval/Treat Not Completed: (P) Patient declined, no reason specified Pt politely declines PT session, he is awaiting transport for DC.  Roshad Hack M Jyron Turman 03/06/2024, 1:08 PM

## 2024-03-06 NOTE — TOC Progression Note (Addendum)
 Transition of Care [facility]) - Progression Note    Patient Details  Name: Larry Lutz MRN: [id number] Date of Birth: [birth year]  [facility] of [facility]) CM/SW Contact  Waddell Barnie Rama, RN Phone Number: 03/06/2024, 12:33 PM  Clinical Narrative:    This NCM was covering, spoke with Staff RN ,  assisted patient with getting PCP follow up apt at [facility] care [facility],  he is homeless ,  they will give him bus passes and assist with cab at dc. Staff RN Chaney states he gave patient kerlex and guaze and patient informed him he can do his own dressing changes.                     Expected Discharge Plan and Services         Expected Discharge Date: 03/06/24                                     Social Drivers of Health (SDOH) Interventions SDOH Screenings   Food Insecurity: Food Insecurity Present (03/01/2024)  Housing: High Risk (03/01/2024)  Transportation Needs: Unmet Transportation Needs (03/01/2024)  Utilities: At Risk (03/01/2024)  Depression (PHQ2-9): Medium Risk (07/12/2022)  Social Connections: Moderately Isolated (03/01/2024)  Tobacco Use: Medium Risk (03/01/2024)    Readmission Risk Interventions     No data to display

## 2024-03-06 NOTE — Plan of Care (Signed)
  Problem: Health Behavior/Discharge Planning: Goal: Ability to manage health-related needs will improve Outcome: Progressing   Problem: Activity: Goal: Risk for activity intolerance will decrease Outcome: Progressing   Problem: Nutrition: Goal: Adequate nutrition will be maintained Outcome: Progressing   

## 2024-03-06 NOTE — TOC Transition Note (Signed)
 Transition of Care [facility]) - Discharge Note   Patient Details  Name: Larry Lutz MRN: [id number] Date of Birth: [birth year]  Transition of Care [facility]) CM/SW Contact:  Inocente GORMAN Kindle, LCSW Phone Number: 03/06/2024, 1:34 PM   Clinical Narrative:    CSW met with patient regarding homeless consult. Patient reported that his dad has arranged for him to go to a hotel but requested bus passes for transport. Bus passes provided along with community resources. He stated he has contacted a few [facility] to see if any have an opening and he is waiting on a call back. RNCM arranging PCP. No further needs identified at this time.    Final next level of care: Home/Self Care Barriers to Discharge: No Barriers Identified   Patient Goals and CMS Choice            Discharge Placement                       Discharge Plan and Services Additional resources added to the After Visit Summary for                                       Social Drivers of Health (SDOH) Interventions SDOH Screenings   Food Insecurity: Food Insecurity Present (03/01/2024)  Housing: High Risk (03/01/2024)  Transportation Needs: Unmet Transportation Needs (03/01/2024)  Utilities: At Risk (03/01/2024)  Depression (PHQ2-9): Medium Risk (07/12/2022)  Social Connections: Moderately Isolated (03/01/2024)  Tobacco Use: Medium Risk (03/01/2024)     Readmission Risk Interventions     No data to display

## 2024-03-06 NOTE — Discharge Summary (Signed)
 Physician Discharge Summary  Larry Lutz FMW:[id number] DOB: 1990-08-30 DOA: 02/29/2024  PCP: Patient, No Pcp Per  Admit date: 02/29/2024 Discharge date: 03/06/2024  Time spent: 35 minutes  Recommendations for Outpatient Follow-up:  PCP in 1 to 2 weeks Infectious disease in in 1 to 2 months Given information on shelters by social work   Discharge Diagnoses:  MRSA abscess Cellulitis HIV Homelessness  Discharge Condition: Improved  Diet recommendation: Regular  Filed Weights   02/29/24 1658  Weight: 72.6 kg    History of present illness:  33/M w HIV on Biktarvy , Ulcerative colitis , hx of MRSA folliculitis/cellulitis, Hypertension presented to ED with complaint of few weeks of pustules on his lower extremities. He notes symptoms have progressed with his left limb being worse affected. He notes he has tender red area over his knee in the area where a pustule recently ruptured. He also notes area mid lateral thigh that is warm erythematous and tender, he was just released from jail a few weeks ago. -In the ED, afebrile, mild leukocytosis, lactate 3.6 and then 5.4, repeat improved,, also had a CT chest and abdomen pelvis in the ER which was unremarkable except for lung nodule - General Surgery consulted, status post I&D 12/21, Ortho consulted, bedside debridement  Hospital Course:   Left thigh, superficial knee abscess, folliculitis History of MRSA - Did with IV vancomycin  initially, discussed with ID will switch to oral doxycycline  at discharge  - Appreciate surgical eval, bedside sp I&D - Ortho input regarding superficial knee abscess, status post bedside debridement, MRI reviewed by Ortho, recommended wound care - TOC consulted, unfortunately he is homeless, information given on shelters and given supplies for wound care   HIV - CD4 count<100 in August 2025, was noncompliant with HAART then - History of alleged compliance since 8/25 onwards - CD4 count is 202, considerable  improvement from last year   Chronic anemia - Likely secondary to above, monitor   Pulmonary nodule - Needs follow-up  Discharge Exam: Vitals:   03/05/24 1945 03/06/24 0307  BP: (!) 133/94 113/71  Pulse: 72 (!) 59  Resp: 18 18  Temp:  98.2 F (36.8 C)  SpO2: 96% 100%   Gen: Awake, Alert, Oriented X 3,  HEENT: no JVD Lungs: Good air movement bilaterally, CTAB CVS: S1S2/RRR Abd: soft, Non tender, non distended, BS present Extremities: No edema Skin: Abscess less in the left thigh and knee significantly improved  Discharge Instructions   Discharge Instructions     Discharge wound care:   Complete by: As directed    Routine wound care for thigh and knee wound   Increase activity slowly   Complete by: As directed       Allergies as of 03/06/2024   No Known Allergies      Medication List     TAKE these medications    bictegravir-emtricitabine -tenofovir  AF 50-200-25 MG Tabs tablet Commonly known as: BIKTARVY  Take 1 tablet by mouth daily. Try to take at the same time each day with or without food.   doxycycline  100 MG tablet Commonly known as: VIBRA -TABS Take 1 tablet (100 mg total) by mouth every 12 (twelve) hours for 10 days.   hydrOXYzine  10 MG tablet Commonly known as: ATARAX  Take 1 tablet (10 mg total) by mouth 3 (three) times daily as needed for itching or anxiety.   multivitamin with minerals Tabs tablet Take 1 tablet by mouth daily.   sulfamethoxazole -trimethoprim  800-160 MG tablet Commonly known as: BACTRIM  DS Take 1 tablet by  mouth 3 (three) times a week.               Discharge Care Instructions  (From admission, onward)           Start     Ordered   03/06/24 0000  Discharge wound care:       Comments: Routine wound care for thigh and knee wound   03/06/24 1139           Allergies[1]    The results of significant diagnostics from this hospitalization (including imaging, microbiology, ancillary and laboratory) are  listed below for reference.    Significant Diagnostic Studies: MR KNEE LEFT WO CONTRAST Result Date: 03/05/2024 CLINICAL DATA:  Left knee tenderness and erythema. Status post bedside debridement. EXAM: MRI OF THE LEFT KNEE WITHOUT CONTRAST TECHNIQUE: Multiplanar, multisequence MR imaging of the knee was performed. No intravenous contrast was administered. COMPARISON:  None Available. FINDINGS: MENISCI Medial: Intact. Lateral: Intact. LIGAMENTS Cruciates: ACL and PCL are intact. Collaterals: Medial collateral ligament is intact. Lateral collateral ligament complex is intact. CARTILAGE Patellofemoral:  No chondral defect. Medial:  No chondral defect. Lateral:  Thinning of the lateral compartment articular cartilage. JOINT: Small joint effusion. Normal Hoffa's fat-pad. POPLITEAL FOSSA: Popliteus tendon is intact. No Baker's cyst. EXTENSOR MECHANISM: Intact quadriceps tendon. Intact patellar tendon. BONES: No fracture or dislocation. No aggressive osseous lesion. Patchy T2 hyperintense marrow signal with corresponding patchy T1 hypointensity in the proximal medial tibial metaphysis is nonspecific and may be reactive. Osteomyelitis can not be entirely excluded. Other: Prepatellar subcutaneous edema and cutaneous thickening with adjacent subcutaneous fluid collection in the anterolateral knee measuring up to 2.9 cm AP by 0.7 cm TR x 3.8 cm cc (series 4, images 9 and 10 and series 7, image 33). IMPRESSION: 1. Prepatellar subcutaneous edema and cutaneous thickening with adjacent subcutaneous fluid collection of indeterminate sterility in the anterolateral knee measuring up to 3.8 x 2.9 x 0.7 cm. Abscess can not be excluded. 2. Patchy edema like signal in the proximal medial tibial metaphysis is nonspecific and may be reactive. Osteomyelitis can not be entirely excluded. 3. Small knee joint effusion. 4. Thinning of the lateral femorotibial compartment articular cartilage. Electronically Signed   By: Harrietta Sherry  M.D.   On: 03/05/2024 14:52   DG Foot Complete Left Result Date: 03/03/2024 CLINICAL DATA:  Left foot pain. EXAM: LEFT FOOT - COMPLETE 3+ VIEW COMPARISON:  None Available. FINDINGS: There is no evidence of fracture or dislocation. There is no evidence of arthropathy or other focal bone abnormality. Soft tissues are unremarkable. IMPRESSION: Negative. Electronically Signed   By: Vanetta Chou M.D.   On: 03/03/2024 15:31   VAS US  LOWER EXTREMITY VENOUS (DVT) (7a-7p) Result Date: 03/01/2024  Lower Venous DVT Study Patient Name:  ROB MCIVER  Date of Exam:   03/01/2024 Medical Rec #: [id number]   Accession #:    [id number] Date of Birth: August 28, 1990   Patient Gender: M Patient Age:   76 years Exam Location:  [facility] Procedure:      VAS US  LOWER EXTREMITY VENOUS (DVT) Referring Phys: LYNWOOD TEMPLETON --------------------------------------------------------------------------------  Indications: Swelling.  Risk Factors: None identified. Limitations: Poor ultrasound/tissue interface and patient pain tolerance. Comparison Study: No prior studies. Performing Technologist: Cordella [name] RVT  Examination Guidelines: A complete evaluation includes B-mode imaging, spectral Doppler, color Doppler, and power Doppler as needed of all accessible portions of each vessel. Bilateral testing is considered an integral part of a complete examination. Limited examinations for  reoccurring indications may be performed as noted. The reflux portion of the exam is performed with the patient in reverse Trendelenburg.  +-----+---------------+---------+-----------+----------+--------------+ RIGHTCompressibilityPhasicitySpontaneityPropertiesThrombus Aging +-----+---------------+---------+-----------+----------+--------------+ CFV  Full           Yes      Yes                                 +-----+---------------+---------+-----------+----------+--------------+    +---------+---------------+---------+-----------+----------+--------------+ LEFT     CompressibilityPhasicitySpontaneityPropertiesThrombus Aging +---------+---------------+---------+-----------+----------+--------------+ CFV      Full           Yes      Yes                                 +---------+---------------+---------+-----------+----------+--------------+ SFJ      Full                                                        +---------+---------------+---------+-----------+----------+--------------+ FV Prox  Full                                                        +---------+---------------+---------+-----------+----------+--------------+ FV Mid   Full                                                        +---------+---------------+---------+-----------+----------+--------------+ FV DistalFull                                                        +---------+---------------+---------+-----------+----------+--------------+ PFV      Full                                                        +---------+---------------+---------+-----------+----------+--------------+ POP      Full           Yes      Yes                                 +---------+---------------+---------+-----------+----------+--------------+ PTV      Full                                                        +---------+---------------+---------+-----------+----------+--------------+ PERO     Full                                                        +---------+---------------+---------+-----------+----------+--------------+  Summary: RIGHT: - No evidence of common femoral vein obstruction.   LEFT: - There is no evidence of deep vein thrombosis in the lower extremity.  - No cystic structure found in the popliteal fossa.  *See table(s) above for measurements and observations. Electronically signed by Fonda Rim on 03/01/2024 at 4:39:29 PM.    Final    CT ABDOMEN  PELVIS W CONTRAST Result Date: 02/29/2024 EXAM: CT ABDOMEN AND PELVIS WITH CONTRAST 02/29/2024 09:00:00 PM TECHNIQUE: CT of the abdomen and pelvis was performed with the administration of 75 mL of iohexol  (OMNIPAQUE ) 350 MG/ML injection. Multiplanar reformatted images are provided for review. Automated exposure control, iterative reconstruction, and/or weight-based adjustment of the mA/kV was utilized to reduce the radiation dose to as low as reasonably achievable. COMPARISON: 05/01/2018 CLINICAL HISTORY: Possible subcutaneous abscess. FINDINGS: LIVER: The liver is unremarkable. GALLBLADDER AND BILE DUCTS: Gallbladder is unremarkable. No biliary ductal dilatation. SPLEEN: No acute abnormality. PANCREAS: No acute abnormality. ADRENAL GLANDS: No acute abnormality. KIDNEYS, URETERS AND BLADDER: No stones in the kidneys or ureters. No hydronephrosis. No perinephric or periureteral stranding. Urinary bladder is unremarkable. GI AND BOWEL: Stomach demonstrates no acute abnormality. There is no bowel obstruction. The appendix is within normal limits. PERITONEUM AND RETROPERITONEUM: No ascites. No free air. VASCULATURE: Aorta is normal in caliber. LYMPH NODES: No lymphadenopathy. REPRODUCTIVE ORGANS: No acute abnormality. BONES AND SOFT TISSUES: Bony structures are within normal limits. No focal soft tissue abnormality. IMPRESSION: 1. No acute abnormality in the abdomen or pelvis, and no drainable fluid collection identified. Electronically signed by: Oneil Devonshire MD 02/29/2024 09:29 PM EST RP Workstation: [id number]   CT Angio Chest PE W and/or Wo Contrast Result Date: 02/29/2024 EXAM: CTA CHEST 02/29/2024 09:00:00 PM TECHNIQUE: CTA of the chest was performed without and with the administration of 75 mL of iohexol  (OMNIPAQUE ) 350 MG/ML injection. Multiplanar reformatted images are provided for review. MIP images are provided for review. Automated exposure control, iterative reconstruction, and/or weight based  adjustment of the mA/kV was utilized to reduce the radiation dose to as low as reasonably achievable. COMPARISON: Chest x-ray from earlier in the same day. CLINICAL HISTORY: Shortness of breath. FINDINGS: PULMONARY ARTERIES: The pulmonary artery is well visualized with a normal branching pattern. No intraluminal filling defect to suggest pulmonary embolism is noted. Main pulmonary artery is normal in caliber. MEDIASTINUM: No cardiac enlargement is noted. The pericardium demonstrates no acute abnormality. The thoracic aorta is unremarkable. LYMPH NODES: No mediastinal, hilar or axillary lymphadenopathy. LUNGS AND PLEURA: The lungs are well aerated bilaterally. A small 6 mm nodule is noted in the right middle lobe anteriorly, best seen on image number 98 and 99 of series 5. No focal consolidation or pulmonary edema. No evidence of pleural effusion or pneumothorax. UPPER ABDOMEN: Limited images of the upper abdomen are unremarkable. SOFT TISSUES AND BONES: Bony structures are within normal limits. No acute soft tissue abnormality. IMPRESSION: 1. No evidence of pulmonary embolism. 2. Small 6 mm nodule in the right middle lobe anteriorly.Per Fleischner Society Guidelines recommend a non-contrast chest CT at 6-12 months, then consider an additional non-contrast chest CT at 18-24 months. Electronically signed by: Oneil Devonshire MD 02/29/2024 09:26 PM EST RP Workstation: [id number]   DG Chest 2 View Result Date: 02/29/2024 CLINICAL DATA:  Shortness of breath. EXAM: CHEST - 2 VIEW COMPARISON:  Chest radiograph dated 06/01/2016. FINDINGS: A mild central vascular congestion. No focal consolidation, pleural effusion or pneumothorax. The cardiac silhouette is within normal limits. No acute osseous pathology. IMPRESSION:  Mild central vascular congestion. No focal consolidation. Electronically Signed   By: Vanetta Chou M.D.   On: 02/29/2024 19:58    Microbiology: Recent Results (from the past 240 hours)  Blood culture  (routine x 2)     Status: None   Collection Time: 02/29/24  7:11 PM   Specimen: BLOOD  Result Value Ref Range Status   Specimen Description BLOOD SITE NOT SPECIFIED  Final   Special Requests   Final    BOTTLES DRAWN AEROBIC AND ANAEROBIC Blood Culture results may not be optimal due to an inadequate volume of blood received in culture bottles   Culture   Final    NO GROWTH 5 DAYS Performed at [facility], [address] [place], [place] [address]    Report Status 03/05/2024 FINAL  Final  Blood culture (routine x 2)     Status: None   Collection Time: 02/29/24  7:14 PM   Specimen: BLOOD  Result Value Ref Range Status   Specimen Description BLOOD SITE NOT SPECIFIED  Final   Special Requests   Final    BOTTLES DRAWN AEROBIC AND ANAEROBIC Blood Culture results may not be optimal due to an inadequate volume of blood received in culture bottles   Culture   Final    NO GROWTH 5 DAYS Performed at [facility], [address] [place], [place] [address]    Report Status 03/05/2024 FINAL  Final  MRSA Next Gen by PCR, Nasal     Status: Abnormal   Collection Time: 03/02/24 11:04 AM   Specimen: Nasal Mucosa; Nasal Swab  Result Value Ref Range Status   MRSA by PCR Next Gen DETECTED (A) NOT DETECTED Final    Comment: RESULT CALLED TO, READ BACK BY AND VERIFIED WITH: RN TINA B ON [id number] @1412  BY SM (NOTE) The GeneXpert MRSA Assay (FDA approved for NASAL specimens only), is one component of a comprehensive MRSA colonization surveillance program. It is not intended to diagnose MRSA infection nor to guide or monitor treatment for MRSA infections. Test performance is not FDA approved in patients less than 23 years old. Performed at [facility], [address], [place], [place] [address]      Labs: Basic Metabolic Panel: Recent Labs  Lab 02/29/24 1911 03/01/24 0007 03/02/24 0413 03/03/24 0704 03/04/24 0819  NA 137 133* 132* 136 137  K 3.9 3.6 3.9 4.1 4.4  CL 97* 98  98 102 104  CO2 29 23 25 26 23   GLUCOSE 94 100* 89 96 91  BUN 16 12 9 14 15   CREATININE 1.26* 1.04 1.14 0.92 0.94  CALCIUM 9.5 8.5* 8.3* 8.6* 8.8*   Liver Function Tests: Recent Labs  Lab 02/29/24 1911 03/01/24 0007  AST 31 29  ALT 14 10  ALKPHOS 74 70  BILITOT 0.5 0.4  PROT 8.4* 6.8  ALBUMIN 3.9 3.2*   No results for input(s): LIPASE, AMYLASE in the last 168 hours. No results for input(s): AMMONIA in the last 168 hours. CBC: Recent Labs  Lab 02/29/24 1911 03/01/24 0007 03/02/24 0413 03/03/24 0704  WBC 10.8* 8.7 7.2 3.9*  NEUTROABS 7.5 5.8  --   --   HGB 10.6* 9.1* 9.2* 10.0*  HCT 35.4* 30.3* 30.2* 32.5*  MCV 73.3* 74.3* 71.6* 71.4*  PLT 367 298 332 341   Cardiac Enzymes: No results for input(s): CKTOTAL, CKMB, CKMBINDEX, TROPONINI in the last 168 hours. BNP: BNP (last 3 results) No results for input(s): BNP in  the last 8760 hours.  ProBNP (last 3 results) No results for input(s): PROBNP in the last 8760 hours.  CBG: No results for input(s): GLUCAP in the last 168 hours.     Signed:  Sigurd Pac MD.  [facility] 03/06/2024, 11:40 AM        [1] No Known Allergies

## 2024-03-06 NOTE — Progress Notes (Signed)
" ° °  ORTHOPAEDIC PROGRESS NOTE  Superficial left knee abscess  SUBJECTIVE: Patient requests I come back later as he wants to be left alone. No issues.    OBJECTIVE: PE: General: left knee wound with mild seropurulent drainage on the dressing, dressing removed, mild tenderness around the knee, swelling has improved, full ROM  Vitals:   03/05/24 1945 03/06/24 0307  BP: (!) 133/94 113/71  Pulse: 72 (!) 59  Resp: 18 18  Temp: 98.7 F (37.1 C) 98.2 F (36.8 C)  SpO2: 96% 100%    Opiates Today (MME): Today's  total administered Morphine  Milligram Equivalents: 0 Opiates Yesterday (MME): Yesterday's total administered Morphine  Milligram Equivalents: 15 Opiates Used in the last two days:  Inpatient Morphine  Milligram Equivalents Per Day 12/17 - 12/23   Values displayed are in units of MME/Day    Order Start / End Date 12/17 12/18 12/19  12/20 12/21 Yesterday Today    oxyCODONE  (Oxy IR/ROXICODONE ) immediate release tablet 5 mg 12/18 - No end date -- 7.5 of 22.5 7.5 of 30 7.5 of 30 7.5 of 30 15 of 30 0 of 30    HYDROmorphone  (DILAUDID ) injection 1 mg 12/19 - 12/19 -- -- 20 of 20 -- -- -- --    HYDROmorphone  (DILAUDID ) injection 1 mg 12/20 - 12/20 -- -- -- 20 of 20 -- -- --    HYDROmorphone  (DILAUDID ) 1 MG/ML injection 12/20 - 12/20 -- -- -- 0 of Unknown -- -- --    Daily Totals  -- 7.5 of 22.5 27.5 of 50 27.5 of Unknown (at least 50) 7.5 of 30 15 of 30 0 of 30    Calculation Errors     Order Type Date Details   HYDROmorphone  (DILAUDID ) 1 MG/ML injection Ordered Dose -- Frequency type could not be determined           MRI of the left knee demonstrates small fluid collection Xray of the left foot negative   ASSESSMENT: Larry Lutz is a 33 y.o. male with left knee abscess  PLAN: Continue daily dressing changes and antibiotics No need for further intervention at this time  Weightbearing: WBAT LLE Insicional and dressing care: Daily dressing changes with Kerlix and ace  wrap Orthopedic device(s): None Showering: hold for now VTE prophylaxis: per primary Pain control: per primary Follow - up plan: outpatient as needed with primary care  Contact information:  Weekdays 8-5 Dr. Bonner Hair, Aleck Stalling PA-C, After hours and holidays please check [email or web address] for group call information for [facility]   Aleck Stalling, PA-C 03/06/24  "

## 2024-03-06 NOTE — Plan of Care (Signed)

## 2024-03-27 ENCOUNTER — Ambulatory Visit: Payer: Self-pay | Admitting: Family Medicine

## 2024-03-28 ENCOUNTER — Telehealth: Payer: Self-pay

## 2024-03-28 NOTE — Telephone Encounter (Signed)
 Call can not be completed, patient has not been active on MyChart.
